# Patient Record
Sex: Female | Born: 1945 | Race: White | Hispanic: No | Marital: Married | Smoking: Never smoker
Health system: Southern US, Community
[De-identification: ages and names within clinical notes are randomized; demographics above are authoritative.]

## PROBLEM LIST (undated history)

## (undated) DIAGNOSIS — I5030 Unspecified diastolic (congestive) heart failure: Secondary | ICD-10-CM

## (undated) DIAGNOSIS — G4733 Obstructive sleep apnea (adult) (pediatric): Secondary | ICD-10-CM

## (undated) DIAGNOSIS — C50919 Malignant neoplasm of unspecified site of unspecified female breast: Secondary | ICD-10-CM

## (undated) DIAGNOSIS — S0101XA Laceration without foreign body of scalp, initial encounter: Secondary | ICD-10-CM

## (undated) DIAGNOSIS — N183 Chronic kidney disease, stage 3 unspecified: Secondary | ICD-10-CM

## (undated) DIAGNOSIS — D649 Anemia, unspecified: Secondary | ICD-10-CM

## (undated) DIAGNOSIS — N611 Abscess of the breast and nipple: Principal | ICD-10-CM

## (undated) DIAGNOSIS — E119 Type 2 diabetes mellitus without complications: Secondary | ICD-10-CM

## (undated) DIAGNOSIS — R002 Palpitations: Secondary | ICD-10-CM

## (undated) DIAGNOSIS — E785 Hyperlipidemia, unspecified: Secondary | ICD-10-CM

## (undated) DIAGNOSIS — S066X9A Traumatic subarachnoid hemorrhage with loss of consciousness of unspecified duration, initial encounter: Secondary | ICD-10-CM

## (undated) DIAGNOSIS — D126 Benign neoplasm of colon, unspecified: Secondary | ICD-10-CM

## (undated) DIAGNOSIS — G8929 Other chronic pain: Secondary | ICD-10-CM

## (undated) DIAGNOSIS — I350 Nonrheumatic aortic (valve) stenosis: Secondary | ICD-10-CM

## (undated) DIAGNOSIS — I10 Essential (primary) hypertension: Secondary | ICD-10-CM

## (undated) DIAGNOSIS — R109 Unspecified abdominal pain: Secondary | ICD-10-CM

## (undated) DIAGNOSIS — I050 Rheumatic mitral stenosis: Secondary | ICD-10-CM

## (undated) HISTORY — DX: Abscess of the breast and nipple: N61.1

## (undated) HISTORY — DX: Palpitations: R00.2

## (undated) HISTORY — DX: Malignant neoplasm of unspecified site of unspecified female breast: C50.919

## (undated) HISTORY — DX: Rheumatic mitral stenosis: I05.0

## (undated) HISTORY — DX: Obstructive sleep apnea (adult) (pediatric): G47.33

## (undated) HISTORY — PX: ULTRASOUND GUIDANCE FOR VASCULAR ACCESS: SHX6516

## (undated) HISTORY — PX: OTHER SURGICAL HISTORY: SHX169

## (undated) HISTORY — DX: Benign neoplasm of colon, unspecified: D12.6

## (undated) HISTORY — PX: DOPPLER ECHOCARDIOGRAPHY: SHX263

## (undated) HISTORY — DX: Nonrheumatic aortic (valve) stenosis: I35.0

---

## 2003-03-04 ENCOUNTER — Ambulatory Visit (HOSPITAL_COMMUNITY): Admission: RE | Admit: 2003-03-04 | Discharge: 2003-03-04 | Payer: Self-pay | Admitting: Internal Medicine

## 2003-03-04 HISTORY — PX: COLONOSCOPY: SHX174

## 2003-11-07 ENCOUNTER — Ambulatory Visit (HOSPITAL_COMMUNITY): Admission: RE | Admit: 2003-11-07 | Discharge: 2003-11-07 | Payer: Self-pay | Admitting: Obstetrics and Gynecology

## 2003-11-16 ENCOUNTER — Ambulatory Visit (HOSPITAL_COMMUNITY): Admission: RE | Admit: 2003-11-16 | Discharge: 2003-11-16 | Payer: Self-pay | Admitting: Obstetrics and Gynecology

## 2003-11-18 ENCOUNTER — Encounter (INDEPENDENT_AMBULATORY_CARE_PROVIDER_SITE_OTHER): Payer: Self-pay | Admitting: Specialist

## 2003-11-18 ENCOUNTER — Encounter: Admission: RE | Admit: 2003-11-18 | Discharge: 2003-11-18 | Payer: Self-pay | Admitting: Obstetrics and Gynecology

## 2003-11-29 DIAGNOSIS — C50919 Malignant neoplasm of unspecified site of unspecified female breast: Secondary | ICD-10-CM

## 2003-11-29 HISTORY — DX: Malignant neoplasm of unspecified site of unspecified female breast: C50.919

## 2003-12-08 ENCOUNTER — Ambulatory Visit (HOSPITAL_COMMUNITY): Admission: RE | Admit: 2003-12-08 | Discharge: 2003-12-08 | Payer: Self-pay | Admitting: Family Medicine

## 2003-12-16 ENCOUNTER — Ambulatory Visit (HOSPITAL_COMMUNITY): Admission: RE | Admit: 2003-12-16 | Discharge: 2003-12-16 | Payer: Self-pay | Admitting: General Surgery

## 2003-12-16 ENCOUNTER — Encounter: Admission: RE | Admit: 2003-12-16 | Discharge: 2003-12-16 | Payer: Self-pay | Admitting: General Surgery

## 2003-12-16 ENCOUNTER — Encounter (INDEPENDENT_AMBULATORY_CARE_PROVIDER_SITE_OTHER): Payer: Self-pay | Admitting: *Deleted

## 2003-12-16 ENCOUNTER — Ambulatory Visit (HOSPITAL_BASED_OUTPATIENT_CLINIC_OR_DEPARTMENT_OTHER): Admission: RE | Admit: 2003-12-16 | Discharge: 2003-12-16 | Payer: Self-pay | Admitting: General Surgery

## 2003-12-16 HISTORY — PX: BREAST SURGERY: SHX581

## 2004-01-09 ENCOUNTER — Ambulatory Visit: Admission: RE | Admit: 2004-01-09 | Discharge: 2004-04-08 | Payer: Self-pay | Admitting: Radiation Oncology

## 2004-04-17 ENCOUNTER — Ambulatory Visit (HOSPITAL_COMMUNITY): Admission: RE | Admit: 2004-04-17 | Discharge: 2004-04-17 | Payer: Self-pay | Admitting: Family Medicine

## 2004-07-25 ENCOUNTER — Ambulatory Visit (HOSPITAL_COMMUNITY): Admission: RE | Admit: 2004-07-25 | Discharge: 2004-07-25 | Payer: Self-pay | Admitting: General Surgery

## 2004-11-07 ENCOUNTER — Ambulatory Visit (HOSPITAL_COMMUNITY): Admission: RE | Admit: 2004-11-07 | Discharge: 2004-11-07 | Payer: Self-pay | Admitting: Hematology and Oncology

## 2005-11-13 ENCOUNTER — Ambulatory Visit (HOSPITAL_COMMUNITY): Admission: RE | Admit: 2005-11-13 | Discharge: 2005-11-13 | Payer: Self-pay | Admitting: Hematology and Oncology

## 2006-06-05 ENCOUNTER — Ambulatory Visit (HOSPITAL_COMMUNITY): Admission: RE | Admit: 2006-06-05 | Discharge: 2006-06-05 | Payer: Self-pay | Admitting: Hematology and Oncology

## 2006-11-26 ENCOUNTER — Ambulatory Visit (HOSPITAL_COMMUNITY): Admission: RE | Admit: 2006-11-26 | Discharge: 2006-11-26 | Payer: Self-pay | Admitting: Hematology and Oncology

## 2007-11-16 ENCOUNTER — Emergency Department (HOSPITAL_COMMUNITY): Admission: EM | Admit: 2007-11-16 | Discharge: 2007-11-16 | Payer: Self-pay | Admitting: Emergency Medicine

## 2007-12-14 ENCOUNTER — Ambulatory Visit (HOSPITAL_COMMUNITY): Admission: RE | Admit: 2007-12-14 | Discharge: 2007-12-14 | Payer: Self-pay | Admitting: Hematology and Oncology

## 2008-03-09 ENCOUNTER — Other Ambulatory Visit: Admission: RE | Admit: 2008-03-09 | Discharge: 2008-03-09 | Payer: Self-pay | Admitting: Obstetrics and Gynecology

## 2008-07-19 ENCOUNTER — Ambulatory Visit (HOSPITAL_COMMUNITY): Admission: RE | Admit: 2008-07-19 | Discharge: 2008-07-19 | Payer: Self-pay | Admitting: Hematology and Oncology

## 2008-10-24 ENCOUNTER — Emergency Department (HOSPITAL_COMMUNITY): Admission: EM | Admit: 2008-10-24 | Discharge: 2008-10-24 | Payer: Self-pay | Admitting: Emergency Medicine

## 2008-12-22 ENCOUNTER — Ambulatory Visit (HOSPITAL_COMMUNITY): Admission: RE | Admit: 2008-12-22 | Discharge: 2008-12-22 | Payer: Self-pay

## 2009-03-27 ENCOUNTER — Encounter: Payer: Self-pay | Admitting: Internal Medicine

## 2009-04-12 ENCOUNTER — Ambulatory Visit (HOSPITAL_COMMUNITY): Admission: RE | Admit: 2009-04-12 | Discharge: 2009-04-12 | Payer: Self-pay | Admitting: Internal Medicine

## 2009-04-12 ENCOUNTER — Encounter: Payer: Self-pay | Admitting: Internal Medicine

## 2009-04-12 ENCOUNTER — Ambulatory Visit: Payer: Self-pay | Admitting: Internal Medicine

## 2009-04-12 HISTORY — PX: COLONOSCOPY: SHX174

## 2009-04-14 ENCOUNTER — Encounter: Payer: Self-pay | Admitting: Internal Medicine

## 2009-12-06 ENCOUNTER — Other Ambulatory Visit: Admission: RE | Admit: 2009-12-06 | Discharge: 2009-12-06 | Payer: Self-pay | Admitting: Obstetrics and Gynecology

## 2010-01-24 ENCOUNTER — Ambulatory Visit (HOSPITAL_COMMUNITY): Admission: RE | Admit: 2010-01-24 | Discharge: 2010-01-24 | Payer: Self-pay | Admitting: Hematology and Oncology

## 2010-04-25 ENCOUNTER — Emergency Department (HOSPITAL_COMMUNITY): Admission: EM | Admit: 2010-04-25 | Discharge: 2010-04-25 | Payer: Self-pay | Admitting: Emergency Medicine

## 2010-05-31 HISTORY — PX: CARDIAC CATHETERIZATION: SHX172

## 2010-06-29 ENCOUNTER — Ambulatory Visit (HOSPITAL_COMMUNITY): Admission: RE | Admit: 2010-06-29 | Discharge: 2010-06-30 | Payer: Self-pay | Admitting: Cardiovascular Disease

## 2010-07-09 ENCOUNTER — Encounter (INDEPENDENT_AMBULATORY_CARE_PROVIDER_SITE_OTHER): Payer: Self-pay | Admitting: Cardiovascular Disease

## 2010-07-09 ENCOUNTER — Ambulatory Visit (HOSPITAL_COMMUNITY): Admission: RE | Admit: 2010-07-09 | Discharge: 2010-07-09 | Payer: Self-pay | Admitting: Cardiovascular Disease

## 2010-07-24 ENCOUNTER — Ambulatory Visit: Payer: Self-pay | Admitting: Surgery

## 2010-08-01 ENCOUNTER — Ambulatory Visit: Payer: Self-pay | Admitting: Surgery

## 2010-08-01 ENCOUNTER — Inpatient Hospital Stay (HOSPITAL_COMMUNITY)
Admission: RE | Admit: 2010-08-01 | Discharge: 2010-08-08 | Payer: Self-pay | Source: Home / Self Care | Admitting: Surgery

## 2010-08-01 ENCOUNTER — Encounter: Payer: Self-pay | Admitting: Surgery

## 2010-08-01 HISTORY — PX: AORTIC VALVE REPLACEMENT: SHX41

## 2010-08-21 ENCOUNTER — Ambulatory Visit: Payer: Self-pay | Admitting: Surgery

## 2010-08-21 ENCOUNTER — Encounter: Admission: RE | Admit: 2010-08-21 | Discharge: 2010-08-21 | Payer: Self-pay | Admitting: Surgery

## 2010-10-03 ENCOUNTER — Encounter (HOSPITAL_COMMUNITY)
Admission: RE | Admit: 2010-10-03 | Discharge: 2010-10-30 | Payer: Self-pay | Source: Home / Self Care | Attending: Cardiovascular Disease | Admitting: Cardiovascular Disease

## 2010-10-19 ENCOUNTER — Other Ambulatory Visit (HOSPITAL_COMMUNITY): Payer: Self-pay | Admitting: Hematology and Oncology

## 2010-10-19 DIAGNOSIS — Z9889 Other specified postprocedural states: Secondary | ICD-10-CM

## 2010-10-20 ENCOUNTER — Encounter: Payer: Self-pay | Admitting: Hematology and Oncology

## 2010-10-21 ENCOUNTER — Encounter: Payer: Self-pay | Admitting: Hematology and Oncology

## 2010-10-22 ENCOUNTER — Encounter: Payer: Self-pay | Admitting: Hematology and Oncology

## 2010-10-31 ENCOUNTER — Ambulatory Visit (HOSPITAL_COMMUNITY): Payer: MEDICARE | Attending: Cardiovascular Disease

## 2010-10-31 DIAGNOSIS — E785 Hyperlipidemia, unspecified: Secondary | ICD-10-CM | POA: Insufficient documentation

## 2010-10-31 DIAGNOSIS — I10 Essential (primary) hypertension: Secondary | ICD-10-CM | POA: Insufficient documentation

## 2010-10-31 DIAGNOSIS — Z5189 Encounter for other specified aftercare: Secondary | ICD-10-CM | POA: Insufficient documentation

## 2010-10-31 DIAGNOSIS — I359 Nonrheumatic aortic valve disorder, unspecified: Secondary | ICD-10-CM | POA: Insufficient documentation

## 2010-10-31 DIAGNOSIS — E119 Type 2 diabetes mellitus without complications: Secondary | ICD-10-CM | POA: Insufficient documentation

## 2010-11-02 ENCOUNTER — Ambulatory Visit (HOSPITAL_COMMUNITY): Payer: MEDICARE | Attending: Cardiovascular Disease

## 2010-11-02 DIAGNOSIS — Z5189 Encounter for other specified aftercare: Secondary | ICD-10-CM | POA: Insufficient documentation

## 2010-11-02 DIAGNOSIS — I359 Nonrheumatic aortic valve disorder, unspecified: Secondary | ICD-10-CM | POA: Insufficient documentation

## 2010-11-05 ENCOUNTER — Ambulatory Visit (HOSPITAL_COMMUNITY): Payer: MEDICARE | Attending: Cardiovascular Disease

## 2010-11-05 DIAGNOSIS — Z5189 Encounter for other specified aftercare: Secondary | ICD-10-CM | POA: Insufficient documentation

## 2010-11-05 DIAGNOSIS — I359 Nonrheumatic aortic valve disorder, unspecified: Secondary | ICD-10-CM | POA: Insufficient documentation

## 2010-11-07 ENCOUNTER — Ambulatory Visit (HOSPITAL_COMMUNITY): Payer: MEDICARE | Attending: Cardiovascular Disease

## 2010-11-07 DIAGNOSIS — I359 Nonrheumatic aortic valve disorder, unspecified: Secondary | ICD-10-CM | POA: Insufficient documentation

## 2010-11-07 DIAGNOSIS — Z5189 Encounter for other specified aftercare: Secondary | ICD-10-CM | POA: Insufficient documentation

## 2010-11-09 ENCOUNTER — Ambulatory Visit (HOSPITAL_COMMUNITY): Payer: MEDICARE | Attending: Cardiovascular Disease

## 2010-11-09 DIAGNOSIS — Z5189 Encounter for other specified aftercare: Secondary | ICD-10-CM | POA: Insufficient documentation

## 2010-11-09 DIAGNOSIS — I359 Nonrheumatic aortic valve disorder, unspecified: Secondary | ICD-10-CM | POA: Insufficient documentation

## 2010-11-12 ENCOUNTER — Ambulatory Visit (HOSPITAL_COMMUNITY): Payer: MEDICARE | Attending: Cardiovascular Disease

## 2010-11-12 DIAGNOSIS — Z5189 Encounter for other specified aftercare: Secondary | ICD-10-CM | POA: Insufficient documentation

## 2010-11-12 DIAGNOSIS — I359 Nonrheumatic aortic valve disorder, unspecified: Secondary | ICD-10-CM | POA: Insufficient documentation

## 2010-11-14 ENCOUNTER — Ambulatory Visit (HOSPITAL_COMMUNITY): Payer: MEDICARE | Attending: Cardiovascular Disease

## 2010-11-14 DIAGNOSIS — I359 Nonrheumatic aortic valve disorder, unspecified: Secondary | ICD-10-CM | POA: Insufficient documentation

## 2010-11-14 DIAGNOSIS — Z5189 Encounter for other specified aftercare: Secondary | ICD-10-CM | POA: Insufficient documentation

## 2010-11-14 DIAGNOSIS — Z954 Presence of other heart-valve replacement: Secondary | ICD-10-CM | POA: Insufficient documentation

## 2010-11-16 ENCOUNTER — Ambulatory Visit (HOSPITAL_COMMUNITY): Payer: MEDICARE

## 2010-11-19 ENCOUNTER — Ambulatory Visit (HOSPITAL_COMMUNITY): Payer: MEDICARE

## 2010-11-21 ENCOUNTER — Ambulatory Visit (HOSPITAL_COMMUNITY): Payer: MEDICARE

## 2010-11-23 ENCOUNTER — Ambulatory Visit (HOSPITAL_COMMUNITY): Payer: MEDICARE

## 2010-11-26 ENCOUNTER — Ambulatory Visit (HOSPITAL_COMMUNITY): Payer: MEDICARE

## 2010-11-28 ENCOUNTER — Ambulatory Visit (HOSPITAL_COMMUNITY): Payer: MEDICARE

## 2010-11-30 ENCOUNTER — Ambulatory Visit (HOSPITAL_COMMUNITY): Payer: MEDICARE

## 2010-12-03 ENCOUNTER — Ambulatory Visit (HOSPITAL_COMMUNITY): Payer: MEDICARE | Attending: Cardiovascular Disease

## 2010-12-03 DIAGNOSIS — I359 Nonrheumatic aortic valve disorder, unspecified: Secondary | ICD-10-CM | POA: Insufficient documentation

## 2010-12-03 DIAGNOSIS — Z5189 Encounter for other specified aftercare: Secondary | ICD-10-CM | POA: Insufficient documentation

## 2010-12-03 DIAGNOSIS — Z954 Presence of other heart-valve replacement: Secondary | ICD-10-CM | POA: Insufficient documentation

## 2010-12-05 ENCOUNTER — Ambulatory Visit (HOSPITAL_COMMUNITY): Payer: MEDICARE

## 2010-12-07 ENCOUNTER — Ambulatory Visit (HOSPITAL_COMMUNITY): Payer: MEDICARE

## 2010-12-10 ENCOUNTER — Ambulatory Visit (HOSPITAL_COMMUNITY): Payer: MEDICARE

## 2010-12-11 LAB — MAGNESIUM
Magnesium: 2.3 mg/dL (ref 1.5–2.5)
Magnesium: 2.5 mg/dL (ref 1.5–2.5)

## 2010-12-11 LAB — PROTIME-INR
INR: 1.24 (ref 0.00–1.49)
INR: 1.3 (ref 0.00–1.49)
INR: 1.34 (ref 0.00–1.49)
Prothrombin Time: 15.3 seconds — ABNORMAL HIGH (ref 11.6–15.2)
Prothrombin Time: 16.4 seconds — ABNORMAL HIGH (ref 11.6–15.2)
Prothrombin Time: 16.8 seconds — ABNORMAL HIGH (ref 11.6–15.2)

## 2010-12-11 LAB — CBC
Hemoglobin: 10.3 g/dL — ABNORMAL LOW (ref 12.0–15.0)
Hemoglobin: 10.4 g/dL — ABNORMAL LOW (ref 12.0–15.0)
Hemoglobin: 10.5 g/dL — ABNORMAL LOW (ref 12.0–15.0)
Hemoglobin: 10.7 g/dL — ABNORMAL LOW (ref 12.0–15.0)
Hemoglobin: 10.8 g/dL — ABNORMAL LOW (ref 12.0–15.0)
MCH: 27.7 pg (ref 26.0–34.0)
MCH: 27.9 pg (ref 26.0–34.0)
MCH: 28 pg (ref 26.0–34.0)
MCH: 28 pg (ref 26.0–34.0)
MCHC: 31.9 g/dL (ref 30.0–36.0)
MCHC: 32.2 g/dL (ref 30.0–36.0)
MCHC: 32.3 g/dL (ref 30.0–36.0)
MCHC: 32.6 g/dL (ref 30.0–36.0)
MCHC: 32.8 g/dL (ref 30.0–36.0)
MCV: 85.8 fL (ref 78.0–100.0)
MCV: 86 fL (ref 78.0–100.0)
MCV: 86.9 fL (ref 78.0–100.0)
Platelets: 106 10*3/uL — ABNORMAL LOW (ref 150–400)
Platelets: 114 10*3/uL — ABNORMAL LOW (ref 150–400)
Platelets: 122 10*3/uL — ABNORMAL LOW (ref 150–400)
Platelets: 126 10*3/uL — ABNORMAL LOW (ref 150–400)
Platelets: 95 10*3/uL — ABNORMAL LOW (ref 150–400)
RBC: 3.65 MIL/uL — ABNORMAL LOW (ref 3.87–5.11)
RBC: 3.74 MIL/uL — ABNORMAL LOW (ref 3.87–5.11)
RBC: 3.82 MIL/uL — ABNORMAL LOW (ref 3.87–5.11)
RBC: 3.86 MIL/uL — ABNORMAL LOW (ref 3.87–5.11)
RDW: 14 % (ref 11.5–15.5)
RDW: 14.1 % (ref 11.5–15.5)
RDW: 14.2 % (ref 11.5–15.5)
RDW: 14.2 % (ref 11.5–15.5)
RDW: 14.3 % (ref 11.5–15.5)
WBC: 10.7 10*3/uL — ABNORMAL HIGH (ref 4.0–10.5)
WBC: 12.6 10*3/uL — ABNORMAL HIGH (ref 4.0–10.5)

## 2010-12-11 LAB — BASIC METABOLIC PANEL
BUN: 21 mg/dL (ref 6–23)
BUN: 26 mg/dL — ABNORMAL HIGH (ref 6–23)
BUN: 27 mg/dL — ABNORMAL HIGH (ref 6–23)
CO2: 25 mEq/L (ref 19–32)
CO2: 25 mEq/L (ref 19–32)
CO2: 28 mEq/L (ref 19–32)
Calcium: 8 mg/dL — ABNORMAL LOW (ref 8.4–10.5)
Calcium: 8.9 mg/dL (ref 8.4–10.5)
Calcium: 9 mg/dL (ref 8.4–10.5)
Chloride: 108 mEq/L (ref 96–112)
Creatinine, Ser: 0.63 mg/dL (ref 0.4–1.2)
Creatinine, Ser: 1.02 mg/dL (ref 0.4–1.2)
Creatinine, Ser: 1.13 mg/dL (ref 0.4–1.2)
Creatinine, Ser: 1.22 mg/dL — ABNORMAL HIGH (ref 0.4–1.2)
GFR calc Af Amer: 54 mL/min — ABNORMAL LOW (ref >60)
GFR calc Af Amer: 58 mL/min — ABNORMAL LOW (ref >60)
GFR calc non Af Amer: 48 mL/min — ABNORMAL LOW (ref >60)
GFR calc non Af Amer: 58 mL/min — ABNORMAL LOW (ref >60)
Glucose, Bld: 121 mg/dL — ABNORMAL HIGH (ref 70–99)
Glucose, Bld: 122 mg/dL — ABNORMAL HIGH (ref 70–99)
Glucose, Bld: 169 mg/dL — ABNORMAL HIGH (ref 70–99)
Potassium: 4 mEq/L (ref 3.5–5.1)
Potassium: 4.1 mEq/L (ref 3.5–5.1)
Sodium: 139 mEq/L (ref 135–145)
Sodium: 142 mEq/L (ref 135–145)

## 2010-12-11 LAB — POCT I-STAT, CHEM 8
BUN: 16 mg/dL (ref 6–23)
Calcium, Ion: 1.17 mmol/L (ref 1.12–1.32)
Calcium, Ion: 1.18 mmol/L (ref 1.12–1.32)
Creatinine, Ser: 0.6 mg/dL (ref 0.4–1.2)
Creatinine, Ser: 1 mg/dL (ref 0.4–1.2)
Glucose, Bld: 169 mg/dL — ABNORMAL HIGH (ref 70–99)
Hemoglobin: 10.9 g/dL — ABNORMAL LOW (ref 12.0–15.0)
Hemoglobin: 11.6 g/dL — ABNORMAL LOW (ref 12.0–15.0)
Sodium: 139 mEq/L (ref 135–145)
TCO2: 24 mmol/L (ref 0–100)
TCO2: 25 mmol/L (ref 0–100)

## 2010-12-11 LAB — POCT I-STAT 4, (NA,K, GLUC, HGB,HCT)
Glucose, Bld: 151 mg/dL — ABNORMAL HIGH (ref 70–99)
Glucose, Bld: 170 mg/dL — ABNORMAL HIGH (ref 70–99)
HCT: 27 % — ABNORMAL LOW (ref 36.0–46.0)
HCT: 28 % — ABNORMAL LOW (ref 36.0–46.0)
HCT: 39 % (ref 36.0–46.0)
Hemoglobin: 13.3 g/dL (ref 12.0–15.0)
Hemoglobin: 9.2 g/dL — ABNORMAL LOW (ref 12.0–15.0)
Hemoglobin: 9.5 g/dL — ABNORMAL LOW (ref 12.0–15.0)
Potassium: 4.2 mEq/L (ref 3.5–5.1)
Potassium: 4.5 mEq/L (ref 3.5–5.1)
Potassium: 4.6 mEq/L (ref 3.5–5.1)
Potassium: 4.8 mEq/L (ref 3.5–5.1)
Sodium: 137 mEq/L (ref 135–145)
Sodium: 140 mEq/L (ref 135–145)
Sodium: 140 mEq/L (ref 135–145)
Sodium: 141 mEq/L (ref 135–145)

## 2010-12-11 LAB — HEMOGLOBIN AND HEMATOCRIT, BLOOD
HCT: 27.2 % — ABNORMAL LOW (ref 36.0–46.0)
Hemoglobin: 9.2 g/dL — ABNORMAL LOW (ref 12.0–15.0)

## 2010-12-11 LAB — GLUCOSE, CAPILLARY
Glucose-Capillary: 100 mg/dL — ABNORMAL HIGH (ref 70–99)
Glucose-Capillary: 101 mg/dL — ABNORMAL HIGH (ref 70–99)
Glucose-Capillary: 102 mg/dL — ABNORMAL HIGH (ref 70–99)
Glucose-Capillary: 128 mg/dL — ABNORMAL HIGH (ref 70–99)
Glucose-Capillary: 129 mg/dL — ABNORMAL HIGH (ref 70–99)
Glucose-Capillary: 138 mg/dL — ABNORMAL HIGH (ref 70–99)
Glucose-Capillary: 145 mg/dL — ABNORMAL HIGH (ref 70–99)
Glucose-Capillary: 173 mg/dL — ABNORMAL HIGH (ref 70–99)
Glucose-Capillary: 183 mg/dL — ABNORMAL HIGH (ref 70–99)
Glucose-Capillary: 184 mg/dL — ABNORMAL HIGH (ref 70–99)
Glucose-Capillary: 193 mg/dL — ABNORMAL HIGH (ref 70–99)
Glucose-Capillary: 249 mg/dL — ABNORMAL HIGH (ref 70–99)
Glucose-Capillary: 87 mg/dL (ref 70–99)
Glucose-Capillary: 88 mg/dL (ref 70–99)
Glucose-Capillary: >600 mg/dL (ref 70–99)

## 2010-12-11 LAB — POCT I-STAT 3, ART BLOOD GAS (G3+)
Acid-base deficit: 1 mmol/L (ref 0.0–2.0)
Acid-base deficit: 1 mmol/L (ref 0.0–2.0)
Bicarbonate: 23.6 mEq/L (ref 20.0–24.0)
Bicarbonate: 27.5 mEq/L — ABNORMAL HIGH (ref 20.0–24.0)
O2 Saturation: 100 %
O2 Saturation: 89 %
Patient temperature: 37
TCO2: 25 mmol/L (ref 0–100)
TCO2: 27 mmol/L (ref 0–100)
TCO2: 29 mmol/L (ref 0–100)
pCO2 arterial: 43.3 mmHg (ref 35.0–45.0)
pCO2 arterial: 46.9 mmHg — ABNORMAL HIGH (ref 35.0–45.0)
pCO2 arterial: 62.7 mmHg (ref 35.0–45.0)
pH, Arterial: 7.25 — ABNORMAL LOW (ref 7.350–7.400)
pH, Arterial: 7.299 — ABNORMAL LOW (ref 7.350–7.400)
pH, Arterial: 7.329 — ABNORMAL LOW (ref 7.350–7.400)
pO2, Arterial: 58 mmHg — ABNORMAL LOW (ref 80.0–100.0)

## 2010-12-11 LAB — CREATININE, SERUM
Creatinine, Ser: 0.6 mg/dL (ref 0.4–1.2)
GFR calc Af Amer: >60 mL/min (ref >60)
GFR calc Af Amer: >60 mL/min (ref >60)

## 2010-12-11 LAB — PLATELET COUNT: Platelets: 110 10*3/uL — ABNORMAL LOW (ref 150–400)

## 2010-12-11 LAB — APTT: aPTT: 30 seconds (ref 24–37)

## 2010-12-11 LAB — POCT I-STAT 3, VENOUS BLOOD GAS (G3P V)
Acid-base deficit: 1 mmol/L (ref 0.0–2.0)
O2 Saturation: 78 %
pCO2, Ven: 49.2 mmHg (ref 45.0–50.0)

## 2010-12-12 ENCOUNTER — Ambulatory Visit (HOSPITAL_COMMUNITY): Payer: MEDICARE

## 2010-12-12 LAB — URINALYSIS, ROUTINE W REFLEX MICROSCOPIC
Glucose, UA: NEGATIVE mg/dL
Nitrite: NEGATIVE
Protein, ur: NEGATIVE mg/dL
Urobilinogen, UA: 1 mg/dL (ref 0.0–1.0)

## 2010-12-12 LAB — COMPREHENSIVE METABOLIC PANEL
ALT: 19 U/L (ref 0–35)
AST: 21 U/L (ref 0–37)
Alkaline Phosphatase: 57 U/L (ref 39–117)
CO2: 26 mEq/L (ref 19–32)
Glucose, Bld: 104 mg/dL — ABNORMAL HIGH (ref 70–99)
Potassium: 4.5 mEq/L (ref 3.5–5.1)
Sodium: 142 mEq/L (ref 135–145)
Total Protein: 6.4 g/dL (ref 6.0–8.3)

## 2010-12-12 LAB — HEMOGLOBIN A1C: Hgb A1c MFr Bld: 6.8 % — ABNORMAL HIGH (ref <5.7)

## 2010-12-12 LAB — CBC
HCT: 40.4 % (ref 36.0–46.0)
Hemoglobin: 12.6 g/dL (ref 12.0–15.0)
Hemoglobin: 12.9 g/dL (ref 12.0–15.0)
MCH: 28.3 pg (ref 26.0–34.0)
MCV: 85.4 fL (ref 78.0–100.0)
RBC: 4.46 MIL/uL (ref 3.87–5.11)
RDW: 14.1 % (ref 11.5–15.5)
WBC: 7.3 10*3/uL (ref 4.0–10.5)
WBC: 8.3 10*3/uL (ref 4.0–10.5)

## 2010-12-12 LAB — URINE MICROSCOPIC-ADD ON

## 2010-12-12 LAB — BLOOD GAS, ARTERIAL
Bicarbonate: 26.1 mEq/L — ABNORMAL HIGH (ref 20.0–24.0)
FIO2: 0.21 %
pH, Arterial: 7.393 (ref 7.350–7.400)
pO2, Arterial: 60 mmHg — ABNORMAL LOW (ref 80.0–100.0)

## 2010-12-12 LAB — APTT: aPTT: 23 seconds — ABNORMAL LOW (ref 24–37)

## 2010-12-12 LAB — GLUCOSE, CAPILLARY
Glucose-Capillary: 148 mg/dL — ABNORMAL HIGH (ref 70–99)
Glucose-Capillary: 262 mg/dL — ABNORMAL HIGH (ref 70–99)
Glucose-Capillary: 310 mg/dL — ABNORMAL HIGH (ref 70–99)

## 2010-12-12 LAB — SURGICAL PCR SCREEN
MRSA, PCR: NEGATIVE
Staphylococcus aureus: NEGATIVE

## 2010-12-12 LAB — BASIC METABOLIC PANEL
CO2: 25 mEq/L (ref 19–32)
Chloride: 105 mEq/L (ref 96–112)
GFR calc Af Amer: >60 mL/min (ref >60)
Potassium: 4.1 mEq/L (ref 3.5–5.1)
Sodium: 138 mEq/L (ref 135–145)

## 2010-12-12 LAB — ABO/RH: ABO/RH(D): B POS

## 2010-12-13 LAB — POCT I-STAT 3, ART BLOOD GAS (G3+)
O2 Saturation: 88 %
TCO2: 27 mmol/L (ref 0–100)
pCO2 arterial: 46 mmHg — ABNORMAL HIGH (ref 35.0–45.0)
pH, Arterial: 7.349 — ABNORMAL LOW (ref 7.350–7.400)

## 2010-12-13 LAB — GLUCOSE, CAPILLARY: Glucose-Capillary: 141 mg/dL — ABNORMAL HIGH (ref 70–99)

## 2010-12-13 LAB — POCT I-STAT 3, VENOUS BLOOD GAS (G3P V)
Acid-Base Excess: 1 mmol/L (ref 0.0–2.0)
O2 Saturation: 56 %
pCO2, Ven: 52.9 mmHg — ABNORMAL HIGH (ref 45.0–50.0)
pO2, Ven: 32 mmHg (ref 30.0–45.0)

## 2010-12-14 ENCOUNTER — Ambulatory Visit (HOSPITAL_COMMUNITY): Payer: MEDICARE

## 2010-12-15 LAB — CBC
HCT: 37.5 % (ref 36.0–46.0)
MCHC: 34.6 g/dL (ref 30.0–36.0)
MCV: 84.9 fL (ref 78.0–100.0)
Platelets: 166 10*3/uL (ref 150–400)
RDW: 14 % (ref 11.5–15.5)
WBC: 8 10*3/uL (ref 4.0–10.5)

## 2010-12-15 LAB — BASIC METABOLIC PANEL
BUN: 12 mg/dL (ref 6–23)
Chloride: 106 mEq/L (ref 96–112)
Creatinine, Ser: 0.6 mg/dL (ref 0.4–1.2)
Glucose, Bld: 187 mg/dL — ABNORMAL HIGH (ref 70–99)
Potassium: 4.1 mEq/L (ref 3.5–5.1)

## 2010-12-15 LAB — DIFFERENTIAL
Basophils Absolute: 0.1 10*3/uL (ref 0.0–0.1)
Basophils Relative: 1 % (ref 0–1)
Eosinophils Absolute: 0.1 10*3/uL (ref 0.0–0.7)
Eosinophils Relative: 2 % (ref 0–5)
Lymphocytes Relative: 21 % (ref 12–46)
Monocytes Absolute: 0.6 10*3/uL (ref 0.1–1.0)

## 2010-12-15 LAB — POCT CARDIAC MARKERS

## 2010-12-17 ENCOUNTER — Ambulatory Visit (HOSPITAL_COMMUNITY): Payer: MEDICARE

## 2010-12-19 ENCOUNTER — Ambulatory Visit (HOSPITAL_COMMUNITY): Payer: MEDICARE

## 2010-12-21 ENCOUNTER — Ambulatory Visit (HOSPITAL_COMMUNITY): Payer: MEDICARE

## 2010-12-24 ENCOUNTER — Ambulatory Visit (HOSPITAL_COMMUNITY): Payer: MEDICARE

## 2010-12-26 ENCOUNTER — Ambulatory Visit (HOSPITAL_COMMUNITY): Payer: MEDICARE

## 2010-12-28 ENCOUNTER — Ambulatory Visit (HOSPITAL_COMMUNITY): Payer: MEDICARE

## 2010-12-31 ENCOUNTER — Ambulatory Visit (HOSPITAL_COMMUNITY): Payer: Medicare Other | Attending: Cardiovascular Disease

## 2010-12-31 DIAGNOSIS — I359 Nonrheumatic aortic valve disorder, unspecified: Secondary | ICD-10-CM | POA: Insufficient documentation

## 2010-12-31 DIAGNOSIS — Z5189 Encounter for other specified aftercare: Secondary | ICD-10-CM | POA: Insufficient documentation

## 2010-12-31 DIAGNOSIS — Z954 Presence of other heart-valve replacement: Secondary | ICD-10-CM | POA: Insufficient documentation

## 2011-01-02 ENCOUNTER — Ambulatory Visit (HOSPITAL_COMMUNITY): Payer: Medicare Other

## 2011-01-04 ENCOUNTER — Ambulatory Visit (HOSPITAL_COMMUNITY): Payer: Medicare Other

## 2011-01-07 ENCOUNTER — Ambulatory Visit (HOSPITAL_COMMUNITY): Payer: Medicare Other

## 2011-01-09 ENCOUNTER — Ambulatory Visit (HOSPITAL_COMMUNITY): Payer: MEDICARE

## 2011-01-11 ENCOUNTER — Ambulatory Visit (HOSPITAL_COMMUNITY): Payer: MEDICARE

## 2011-01-14 ENCOUNTER — Ambulatory Visit (HOSPITAL_COMMUNITY): Payer: MEDICARE

## 2011-01-14 LAB — GLUCOSE, CAPILLARY: Glucose-Capillary: 162 mg/dL — ABNORMAL HIGH (ref 70–99)

## 2011-01-16 ENCOUNTER — Ambulatory Visit (HOSPITAL_COMMUNITY): Payer: MEDICARE

## 2011-01-18 ENCOUNTER — Ambulatory Visit (HOSPITAL_COMMUNITY): Payer: Medicare Other

## 2011-01-21 ENCOUNTER — Ambulatory Visit (HOSPITAL_COMMUNITY): Payer: Medicare Other

## 2011-01-23 ENCOUNTER — Ambulatory Visit (HOSPITAL_COMMUNITY): Payer: MEDICARE

## 2011-01-24 ENCOUNTER — Other Ambulatory Visit (HOSPITAL_COMMUNITY): Payer: Self-pay | Admitting: Hematology and Oncology

## 2011-01-24 DIAGNOSIS — Z9889 Other specified postprocedural states: Secondary | ICD-10-CM

## 2011-01-25 ENCOUNTER — Ambulatory Visit (HOSPITAL_COMMUNITY): Payer: MEDICARE

## 2011-01-30 ENCOUNTER — Ambulatory Visit (HOSPITAL_COMMUNITY): Admission: RE | Admit: 2011-01-30 | Payer: MEDICARE | Source: Ambulatory Visit

## 2011-01-30 ENCOUNTER — Encounter (HOSPITAL_COMMUNITY): Payer: MEDICARE

## 2011-02-06 ENCOUNTER — Ambulatory Visit (HOSPITAL_COMMUNITY)
Admission: RE | Admit: 2011-02-06 | Discharge: 2011-02-06 | Disposition: A | Discharge status: Home / Self Care | Payer: Medicare Other | Source: Ambulatory Visit | Attending: Hematology and Oncology | Admitting: Hematology and Oncology

## 2011-02-06 DIAGNOSIS — Z853 Personal history of malignant neoplasm of breast: Secondary | ICD-10-CM | POA: Insufficient documentation

## 2011-02-06 DIAGNOSIS — Z9889 Other specified postprocedural states: Secondary | ICD-10-CM

## 2011-02-12 NOTE — Op Note (Signed)
NAME:  Hailey Curry, Hailey Curry              ACCOUNT NO.:  [id number]   MEDICAL RECORD NO.:  [id number]          PATIENT TYPE:  AMB   LOCATION:  DAY                           FACILITY:  [facility]   PHYSICIAN:  R. Roetta Sessions, M.D. DATE OF BIRTH:  [birth year]   DATE OF PROCEDURE:  04/12/2009  DATE OF DISCHARGE:                               OPERATIVE REPORT   INDICATIONS FOR PROCEDURE:  A 63-year lady with history colonic  adenomas. Last colonoscopy yielded only a single inflammatory polyp.  She is really having no lower GI tract symptoms.  She is here for  surveillance.  Risks, benefits, alternatives and limitations have  discussed, questions answered.  Please see __________ the medical  record.   PROCEDURE NOTE:  The O2 saturation, blood pressure, pulse respirations  monitored throughout entire procedure.  Conscious sedation Versed 7 mg  IV,  fentanyl 125 mcg IV in divided doses.   INSTRUMENT:  Pentax video chip system.   FINDINGS:  Digital rectal exam revealed no abnormalities.  The prep was  adequate.  Colon:  Colonic mucosa was surveyed from the rectosigmoid  junction through the left transverse, right colon, appendiceal orifice,  ileocecal valve and cecum.  These structures well seen, photographed for  the record.  From this level, the scope was slowly cautiously withdrawn.  All previous mentioned mucosal surfaces were again seen.  The patient  was noted to have left-sided diverticula in the distal ascending  segment.  There was a 2 cm x 0.5-cm polyp.  I saw polyps on a fold which  was resected clean with two passes of the hot snare cautery.  There was  a 4-mm polyp on a stalk adjacent to this area which was cold snared.  The remainder of the colonic mucosa appeared normal.  Scope was pulled  down to the rectum.  __________ the rectal mucosa including retroflexed  view of the anal verge demonstrated a 4-mm polyp on a stalk at 7 cm from  the anal verge.  It was hot snared, removed and  recovered.  The patient  tolerated the procedure well __________ endoscopy.  Cecal withdrawal  time 12 minutes.   IMPRESSION:  1. Rectal polyp, status post snare removal, otherwise unremarkable      rectum.  2. Left-sided diverticula.  3. Polyps in the ascending colon, status post snare polypectomy.  4. The remainder of the colonic os appeared unremarkable.   RECOMMENDATIONS:  1. No aspirin or __________medications for 5 days.  2. Follow-up on path.  3. Further recommendations to follow.      Jonathon Bellows, M.D.  Electronically Signed     RMR/MEDQ  D:  04/12/2009  T:  04/12/2009  Job:  [id number]   cc:   Kirk Ruths, M.D.  Fax: [phone]

## 2011-02-12 NOTE — Consult Note (Signed)
NEW PATIENT CONSULTATION   Hailey, Curry  DOB:  13-Jun-1946                                        July 24, 2010  CHART #:  [id number]   ADDENDUM.   PAST MEDICAL HISTORY:  She also has a history of breast cancer status  post right breast lumpectomy and postoperative radiation therapy and  oral chemotherapy and has been followed by Dr. Margo Common without evidence  of recurrence.   Evelene Croon, M.D.  Electronically Signed   BB/MEDQ  D:  07/24/2010  T:  07/25/2010  Job:  [id number]

## 2011-02-12 NOTE — Assessment & Plan Note (Signed)
OFFICE VISIT   Hailey Curry, Hailey Curry  DOB:  Nov 27, 1945                                        August 21, 2010  CHART #:  [id number]   HISTORY:  The patient returned to my office today for followup status  post aortic valve replacement with a 21-mm [employer] mechanical  valve on August 01, 2010.  Her postoperative course was complicated by  atrial fibrillation and she was started on amiodarone with conversion to  sinus rhythm.  She was sent home on Coumadin for mechanical valve and  the INR has been adjusted by Dr. Allyson Sabal.  She said she has been feeling  well overall.  She is walking mostly in-house without chest pain or  shortness of breath.  She did get out to a couple of stores yesterday  and said she was fairly tired at the end of the day.  She feels much  better today though.   PHYSICAL EXAMINATION:  Blood pressure 133/75, her pulse is 78 and  regular, respiratory rate is 18 and unlabored.  Oxygen saturation on  room air is 94%.  She looks well.  Cardiac exam shows regular rate and  rhythm with crisp valve sounds.  Her lungs are clear.  Her chest  incision is healing well and sternum is stable.  There is no peripheral  edema.   MEDICATIONS:  Aspirin 81 mg daily, fish oil 1200 mg b.i.d., Janumet  50/1000 b.i.d., cyanocobalamin monthly, amiodarone 200 mg daily, Lasix  40 mg b.i.d., potassium chloride 20 mEq b.i.d., Lopressor 25 mg daily,  Coumadin as directed, Celexa 20 mg q.h.s., and Flonase spray p.r.n.   IMPRESSION:  Overall, the patient is doing very well following her  surgery.  She was sent home on Lasix 40 b.i.d. and potassium 20 b.i.d.  and she said her weight has been stable.  She has no pleural effusion or  peripheral edema, and I told her she could decrease Lasix and potassium  to once daily for the next 2 weeks.  If her weight remains stable on  that dose, then she can discontinue those medications altogether.  She  had been on  spirolactone preoperatively.  I told her she can return to  driving car at this time, but should refrain from lifting anything  heavier than 10 pounds for total of 3 months from date of surgery.  She  will continue to follow up with Dr.  Allyson Sabal and he will follow her anticoagulation profile.  She will return  to see me if she has any problems with her incision.   Evelene Croon, M.D.  Electronically Signed   BB/MEDQ  D:  08/21/2010  T:  08/21/2010  Job:  [id number]   cc:   Kirk Ruths, M.D.

## 2011-02-12 NOTE — Consult Note (Signed)
NEW PATIENT CONSULTATION   Hailey Curry, Hailey Curry  DOB:  Jul 01, 1946                                        July 24, 2010  CHART #:  [id number]   REASON FOR CONSULTATION:  Critical aortic stenosis.   CLINICAL HISTORY:  I was asked by Dr. Allyson Sabal to evaluate the patient for  consideration of aortic valve replacement.  She is a 65 year old woman  with a known history of moderate aortic stenosis by echocardiogram in  October 2010.  She was seen in the emergency room in August 2011 with  some chest discomfort that she thought was gastroesophageal reflux  disease as well as some mild dyspnea.  She had a previous stress Myoview  examination in August 2009 which showed no evidence of myocardial  ischemia.  The ejection fraction was %.  She underwent a repeat 2-D  echocardiogram on May 30, 2010, which showed severe aortic valve  stenosis with an aortic valve area of 0.52 sq. cm with a mean pressure  gradient of 86 and a maximum pressure gradient of 131.  There was  moderate mitral annular calcification with moderate mitral valve  prolapse and mild mitral regurgitation.  Left ventricle ejection  fraction was greater than 55% with mild concentric left ventricular  hypertrophy.  There was evidence of impaired left ventricular  relaxation.  The aorta appeared to be of normal size.  Right ventricle  was normal.  She subsequently underwent a cardiac catheterization  performed on June 29, 2010.  This showed normal coronary arteries.  The aortic valve area was 0.53 sq. cm with a peak pullback gradient of  60 and a peak-to-peak gradient of 100.  Cardiac index was 2.2-2.4.  The  pulmonary capillary wedge pressure was 34.  Pulmonary artery pressure  was 56/40 with a mean of 48.  Right atrial pressure was mean of 18.  Right ventricular systolic pressure was 64 with diastolic pressure of  12.  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  She said she had fairly severe pain in her right leg when this  presented.  She still has some pain down over the medial aspect of the  right lower leg.  She has also had some persistent swelling in the right  leg since her catheterization.   REVIEW OF SYSTEMS:  GENERAL:  She does report significant weight gain  over the past 2 months going from around 225-250 pounds.  She does  report significant fatigue.  EYES:  Negative.  ENT:  Negative.  She does see a dentist regularly.  ENDOCRINE:  She does have diabetes.  She denies hypothyroidism.  CARDIOVASCULAR:  She did have some chest pressure in August which she  feels was due to reflux.  She reports some exertional dyspnea as well as  some orthopnea.  She denies palpitations.  She has had some peripheral  edema, worse on the right than left.  RESPIRATORY:  She denies cough and sputum production.  She has had  occasional wheezing.  GI:  She does have a history of reflux.  She denies melena and bright  red blood per rectum.  She has never had peptic ulcer.  GU:  She denies dysuria  and hematuria.  VASCULAR:  She denies claudication and phlebitis.  NEUROLOGIC:  She denies any focal weakness or numbness.  She denies  dizziness and syncope.  She has never had a TIA or a stroke.  MUSCULOSKELETAL:  She does have arthritis.  PSYCHIATRIC:  She has a history of depression.  HEMATOLOGIC:  She denies any history of bleeding disorders or easy  bleeding.   ALLERGIES:  DEMEROL and LIDOCAINE both of which cause elevated blood  pressure.   MEDICATIONS:  1. Aspirin 81 mg daily.  2. Fish oil 1200 mg daily.  3. Janumet 50/1000 b.i.d.  4. Citalopram 40 mg daily.  5. Spironolactone 50 mg daily.  6. Ultram p.r.n. for pain.  7. Ibuprofen p.r.n. for pain.  8. Cyanocobalamin 100 mg monthly.  9. Hydrocodone p.r.n. pain.   PAST  MEDICAL HISTORY:  Significant for adult-onset diabetes since age  74.  She has a history of hypercholesterolemia and hypertension.   SOCIAL HISTORY:  She is married and lives with her husband.  She has no  children.  She is retired.  She has never smoked.  Denies alcohol use.   FAMILY HISTORY:  She denies any family history of heart valve disease.  She said one grandmother did have coronary disease but was elderly.   PHYSICAL EXAMINATION:  Vital Signs:  Blood pressure 131/81, pulse 90 and  regular, respiratory rate is 18 and unlabored.  Oxygen saturation on  room air is 91%.  General:  She is an obese white female in no distress.  HEENT:  Normocephalic and atraumatic.  Pupils are equal and reactive to  light.  Extraocular muscles are intact.  Oropharynx is clear.  Teeth are  in good condition.  Neck:  Normal carotid pulses bilaterally.  There is  a transmitted murmur to both sides of her neck.  There is no adenopathy  or thyromegaly.  Cardiac:  Regular rate and rhythm with a grade 2/6  harsh systolic murmur over the aortic valve.  Her lungs are clear.  Abdomen:  Active bowel sounds.  Abdomen is soft, obese, and nontender.  There are no palpable masses or organomegaly.  Extremities:  Mild right  lower leg edema.  Pedal pulses are palpable bilaterally.  Her skin is  warm and dry.  Neurologic exam shows her to be alert and oriented x3.  Motor and sensory exams are grossly normal.   IMAGING:  Carotid Doppler examination showed no significant internal  carotid artery stenosis bilaterally.   IMPRESSION:  The patient has critical aortic stenosis and is becoming  symptomatic.  I agree that it is time to proceed with aortic valve  replacement.  Since she is 65 years old and in relatively good condition  with normal coronary arteries, I recommended that she have a mechanical  valve.  She has no contraindication to anticoagulation and feels that  she could comply with daily Coumadin use and  continued follow up of her  INR.  I discussed the pros and cons of both mechanical and tissue valves  with her and her husband and they are in agreement with proceeding with  a mechanical valve.  I discussed the aortic valve replacement surgery  including alternatives, benefits, and risks including but not limited to  bleeding, blood transfusion, infection, stroke, myocardial infarction,  heart block requiring permanent pacemaker, organ dysfunction, and death.  She understands and would like to proceed as quickly as possible.  We  will plan to do this on Wednesday, August 01, 2010.  Evelene Croon, M.D.  Electronically Signed   BB/MEDQ  D:  07/24/2010  T:  07/25/2010  Job:  [id number]   cc:   Nanetta Batty, M.D.  Kirk Ruths, M.D.

## 2011-02-15 NOTE — Op Note (Signed)
NAME:  Hailey Curry, Hailey Curry                        ACCOUNT NO.:  [id number]   MEDICAL RECORD NO.:  [id number]                   PATIENT TYPE:  AMB   LOCATION:  DAY                                  FACILITY:  [facility]   PHYSICIAN:  R. Roetta Sessions, M.D.              DATE OF BIRTH:  1946-07-17   DATE OF PROCEDURE:  03/04/2003  DATE OF DISCHARGE:                                 OPERATIVE REPORT   PROCEDURE:  Colonoscopy with biopsy.   INDICATIONS FOR PROCEDURE:  The patient is a 65 year old lady with a history  of colonic adenomatous polyps resected at the time of colonoscopy in 1999.  She is here for surveillance.  She is devoid of any lower GI tract symptoms.  Colonoscopy is now being done as a surveillance maneuver.  This approach has  been discussed with the patient previously and again today at the bedside.  The potential risks, benefits, and alternatives have been reviewed and  questions answered.  She is agreeable.  Please see my handwritten H&P for  more information.   PROCEDURE:  O2 saturation, blood pressure, pulses, and respirations were  monitored throughout the entire procedure.  Conscious sedation was with  Versed 5 mg IV, Fentanyl 125 mcg IV in divided doses.  The instrument used  was the Olympus video chip adult colonoscope.   FINDINGS:  Digital rectal examination revealed no abnormalities.   ENDOSCOPIC FINDINGS:  The prep was adequate.   Rectum:  Examination of the rectal mucosa including retroflex view of the  anal verge revealed only internal hemorrhoids.   Colon:  The colonic mucosa was surveyed from the rectosigmoid junction  through the left, transverse, right colon to the area of the appendiceal  orifice, ileocecal valve, and cecum.  These structures were well-seen and  photographed for the record.  The patient had scattered left-sided  diverticula and a 2-mm polyp on the ileocecal valve which was cold  biopsied/removed.  From this level, the scope was slowly  withdrawn.  All  previously mentioned mucosal surfaces were again seen, and no other  abnormalities were observed.  The patient tolerated the procedure well and  was reactive in endoscopy.   IMPRESSION:  1. Internal hemorrhoids.  Otherwise normal rectum.  2. Scattered left-sided diverticula, diminutive polyp at ileocecal valve     cold biopsied/removed.  The remainder of the colonic mucosa appeared     normal.    RECOMMENDATIONS:  1. Follow up on pathology.  2. Further recommendations to follow.                                               Jonathon Bellows, M.D.    RMR/MEDQ  D:  03/04/2003  T:  03/04/2003  Job:  [id number]   cc:   Dionne Ano.  Regino Schultze, M.D.  [address]  [place]  [place] [address]  Fax: [phone]   Tilda Burrow, M.D.  [address] [place]  [place] [address]  Fax: [phone]

## 2011-02-15 NOTE — Op Note (Signed)
NAME:  Hailey Curry, Hailey Curry                        ACCOUNT NO.:  [id number]   MEDICAL RECORD NO.:  [id number]                   PATIENT TYPE:  OUT   LOCATION:  [facility]                                 FACILITY:  [facility]   PHYSICIAN:  Rose Phi. Maple Hudson, M.D.                DATE OF BIRTH:  27-Jan-1946   DATE OF PROCEDURE:  12/16/2003  DATE OF DISCHARGE:  12/16/2003                                 OPERATIVE REPORT   PREOPERATIVE DIAGNOSIS:  Stage I carcinoma of the right breast.   POSTOPERATIVE DIAGNOSIS:  Stage I carcinoma of the right breast.   OPERATION:  1. Blue dye injection.  2. Right partial mastectomy with needle localization and specimen     mammography.  3. Right sentinel lymph node biopsy.   SURGEON:  Rose Phi. Maple Hudson, M.D.   ANESTHESIA:  General.   OPERATIVE PROCEDURE:  Prior to coming to the operating room, a localization  wire had been fixed for the lesion in the right breast.  In addition, 1 mCi  of Technetium sulfur colloid was injected intradermally.   After suitable general anesthesia was induced, the patient was placed in the  supine position with the arms on the arm board.  A mixture of 2 mL of  methylene blue and 3 mL of saline was then injected in the subareolar tissue  and the breast massaged for about three minutes.   After prepping and draping the breast and axilla, a curved incision in the  upper outer quadrant to the 9 o'clock position of the right breast was then  outlined using the previously-placed wire as a guide.  We then dissected out  and removed the wire and the surrounding tissue.  Specimen oriented for the  pathologist.  It was then submitted for a specimen mammogram, to be followed  by evaluation by the pathologist.   While that was being done, a transverse axillary incision was made with  dissection through the subcutaneous tissue to the clavipectoral fascia.  Deep to this, after a fairly prolonged dissection because of the size and  depth, a hot, blue  lymph node was removed.  It had counts in excess of 1000.  It was bright blue.  We removed it, and there were no other blue, hot, or  palpable nodes.  Touch preps of the node were negative and the margins were  clean.  Incisions were closed with 3-0 Vicryl and staples.  Dressings were  applied and the patient was transferred to the recovery room in satisfactory  condition, having tolerated the procedure well.                                               Rose Phi. Maple Hudson, M.D.    PRY/MEDQ  D:  12/16/2003  T:  12/19/2003  Job:  [id number]

## 2011-07-15 ENCOUNTER — Other Ambulatory Visit (HOSPITAL_COMMUNITY): Payer: Self-pay | Admitting: Hematology and Oncology

## 2011-07-15 DIAGNOSIS — Z139 Encounter for screening, unspecified: Secondary | ICD-10-CM

## 2011-07-19 ENCOUNTER — Emergency Department (HOSPITAL_COMMUNITY)
Admission: EM | Admit: 2011-07-19 | Discharge: 2011-07-19 | Disposition: A | Discharge status: Home / Self Care | Payer: Medicare Other | Attending: Emergency Medicine | Admitting: Emergency Medicine

## 2011-07-19 ENCOUNTER — Encounter: Payer: Self-pay | Admitting: Emergency Medicine

## 2011-07-19 ENCOUNTER — Emergency Department (HOSPITAL_COMMUNITY): Payer: Medicare Other

## 2011-07-19 DIAGNOSIS — Z7901 Long term (current) use of anticoagulants: Secondary | ICD-10-CM | POA: Insufficient documentation

## 2011-07-19 DIAGNOSIS — Z859 Personal history of malignant neoplasm, unspecified: Secondary | ICD-10-CM | POA: Insufficient documentation

## 2011-07-19 DIAGNOSIS — I10 Essential (primary) hypertension: Secondary | ICD-10-CM | POA: Insufficient documentation

## 2011-07-19 DIAGNOSIS — S0990XA Unspecified injury of head, initial encounter: Secondary | ICD-10-CM | POA: Insufficient documentation

## 2011-07-19 DIAGNOSIS — Z951 Presence of aortocoronary bypass graft: Secondary | ICD-10-CM | POA: Insufficient documentation

## 2011-07-19 DIAGNOSIS — R011 Cardiac murmur, unspecified: Secondary | ICD-10-CM | POA: Insufficient documentation

## 2011-07-19 DIAGNOSIS — E119 Type 2 diabetes mellitus without complications: Secondary | ICD-10-CM | POA: Insufficient documentation

## 2011-07-19 DIAGNOSIS — IMO0002 Reserved for concepts with insufficient information to code with codable children: Secondary | ICD-10-CM | POA: Insufficient documentation

## 2011-07-19 LAB — PROTIME-INR
INR: 2.78 — ABNORMAL HIGH (ref 0.00–1.49)
Prothrombin Time: 29.8 seconds — ABNORMAL HIGH (ref 11.6–15.2)

## 2011-07-19 NOTE — ED Provider Notes (Signed)
History     CSN: [id number] Arrival date & time: 07/19/2011  7:52 PM   First MD Initiated Contact with Patient 07/19/11 1955      Chief Complaint  Patient presents with  . Head Injury    (Consider location/radiation/quality/duration/timing/severity/associated sxs/prior treatment) Patient is a 65 y.o. female presenting with head injury. The history is provided by the patient.  Head Injury  The incident occurred 3 to 5 hours ago. She came to the ER via walk-in. There was no loss of consciousness. There was no blood loss. The quality of the pain is described as dull. The pain is mild. The pain has been constant since the injury. Pertinent negatives include no numbness, no vomiting and no weakness.   patient hit her head on her trunk lid. She states that she was closing the trunk lid with the automatic closer and didn't get out of the way soon enough. No loss of consciousness. She is on Coumadin for aortic valve replacement. Her INR yesterday was 3 point no loss of consciousness no bleeding. She has a little pain on her scalp, but no headache per the patient. No numbness or weakness. Past Medical History  Diagnosis Date  . Diabetes mellitus   . Hypertension   . High cholesterol   . Cancer     Past Surgical History  Procedure Date  . Coronary artery bypass graft   . Breast surgery     History reviewed. No pertinent family history.  History  Substance Use Topics  . Smoking status: Never Smoker   . Smokeless tobacco: Not on file  . Alcohol Use: No    OB History    Grav Para Term Preterm Abortions TAB SAB Ect Mult Living                  Review of Systems  Constitutional: Negative for activity change and appetite change.  HENT: Negative for neck stiffness.   Eyes: Negative for pain.  Respiratory: Negative for chest tightness and shortness of breath.   Cardiovascular: Negative for chest pain and leg swelling.  Gastrointestinal: Negative for nausea, vomiting, abdominal  pain and diarrhea.  Genitourinary: Negative for flank pain.  Musculoskeletal: Negative for back pain.  Skin: Negative for rash.  Neurological: Negative for weakness, numbness and headaches.  Hematological: Bruises/bleeds easily.  Psychiatric/Behavioral: Negative for behavioral problems.    Allergies  Demerol  Home Medications  No current outpatient prescriptions on file.  BP 178/67  Pulse 84  Temp(Src) 97.7 F (36.5 C) (Oral)  Resp 20  Ht 5' 3.5" (1.613 m)  Wt 253 lb (114.76 kg)  BMI 44.11 kg/m2  SpO2 95%  Physical Exam  Nursing note and vitals reviewed. Constitutional: She is oriented to person, place, and time. She appears well-developed and well-nourished.  HENT:  Head: Normocephalic.       Mild tenderness to right scalp.   Eyes: EOM are normal. Pupils are equal, round, and reactive to light.  Neck: Normal range of motion. Neck supple.  Cardiovascular: Normal rate and regular rhythm.   Murmur heard. Pulmonary/Chest: Effort normal and breath sounds normal. No respiratory distress. She has no wheezes. She has no rales.  Abdominal: Soft. Bowel sounds are normal. She exhibits no distension. There is no tenderness. There is no rebound and no guarding.  Musculoskeletal: Normal range of motion.  Neurological: She is alert and oriented to person, place, and time. No cranial nerve deficit.  Skin: Skin is warm and dry.  Psychiatric: She has a  normal mood and affect. Her speech is normal.    ED Course  Procedures (including critical care time)   Labs Reviewed  PROTIME-INR   No results found.   No diagnosis found.    MDM  Hit head on coumadin. INR of 2.8. Head CT negative. discharged        Juliet Rude. Rubin Payor, MD 07/19/11 2158

## 2011-07-19 NOTE — ED Notes (Signed)
Pt was head in head with car trunk this am. Pt told by pcp to come to ed for evaluation because she is on coumadin. Denies loc/laceration/dizziness. C/o soreness.

## 2011-11-19 IMAGING — CR DG CHEST 2V
2 series · 2 of 2 positions shown · non-contrast
Comparison: 04/25/2010

CLINICAL DATA: Aortic stenosis.  Preop respiratory evaluation.

CHEST - 2 VIEW

[view not recorded (1 of 2)]
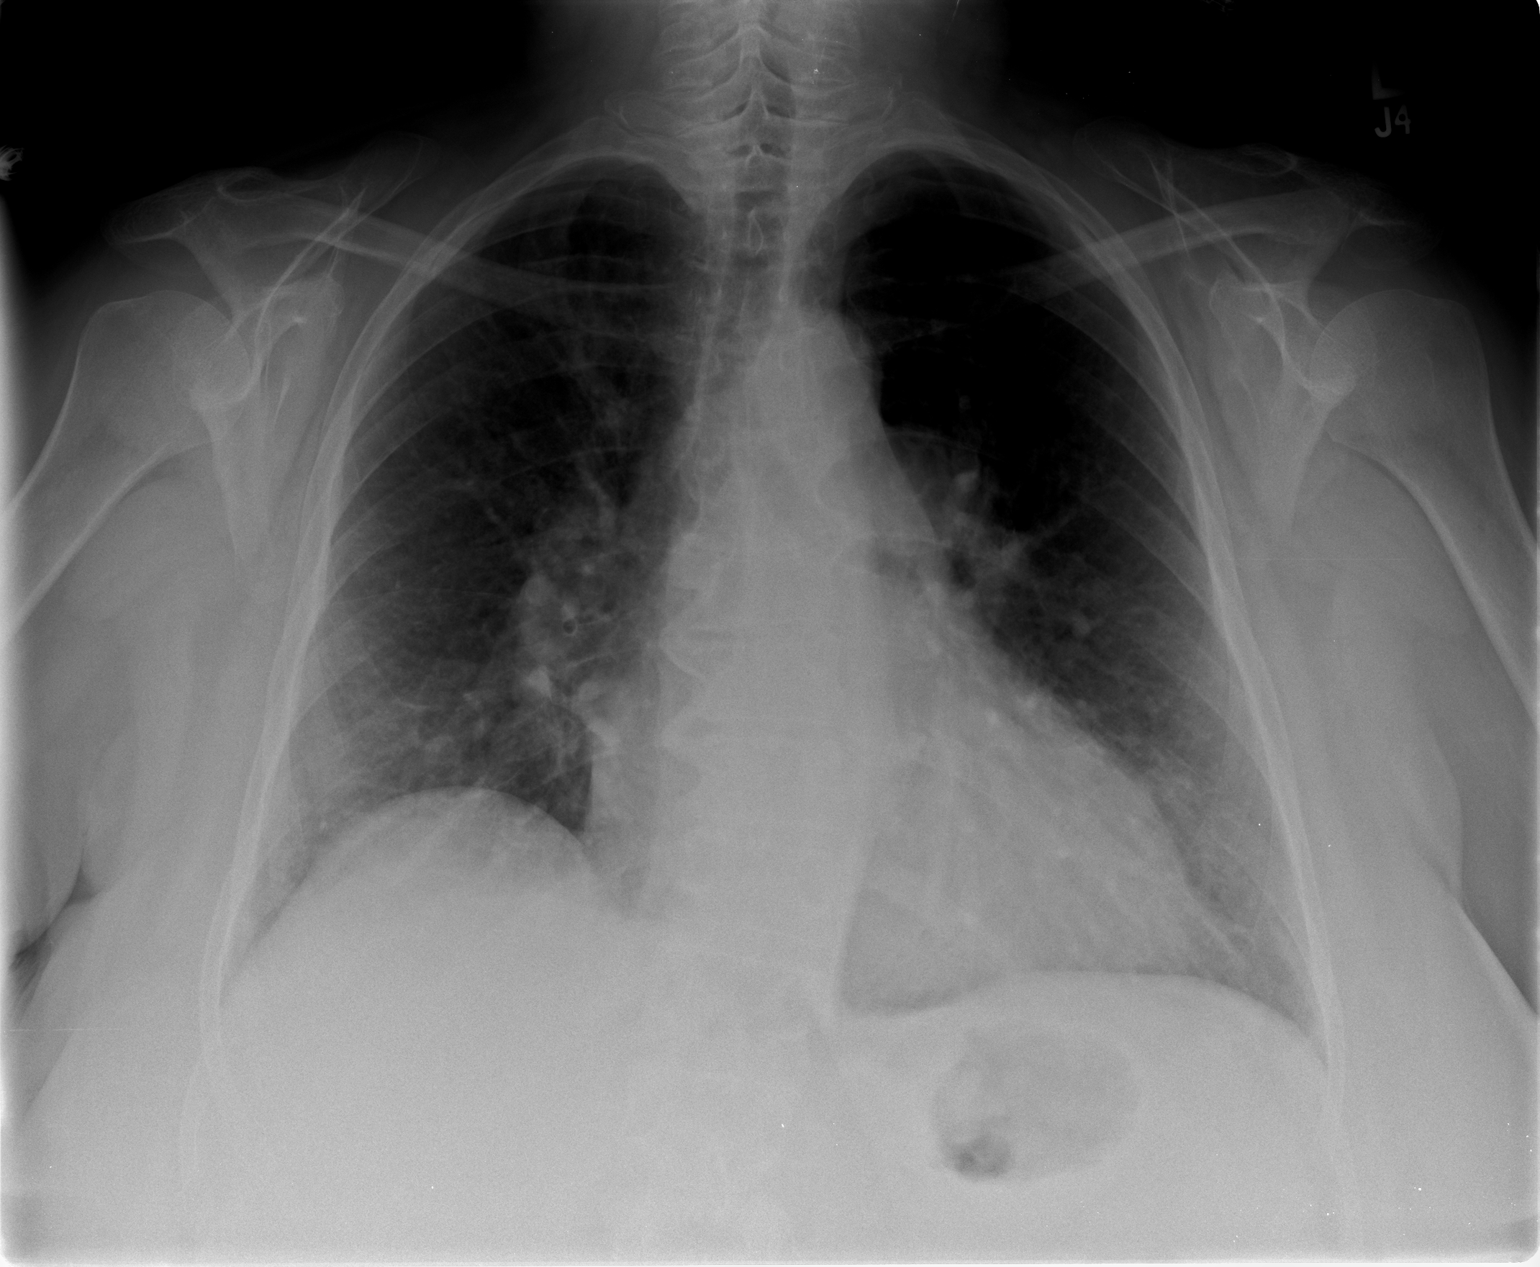

[view not recorded (2 of 2)]
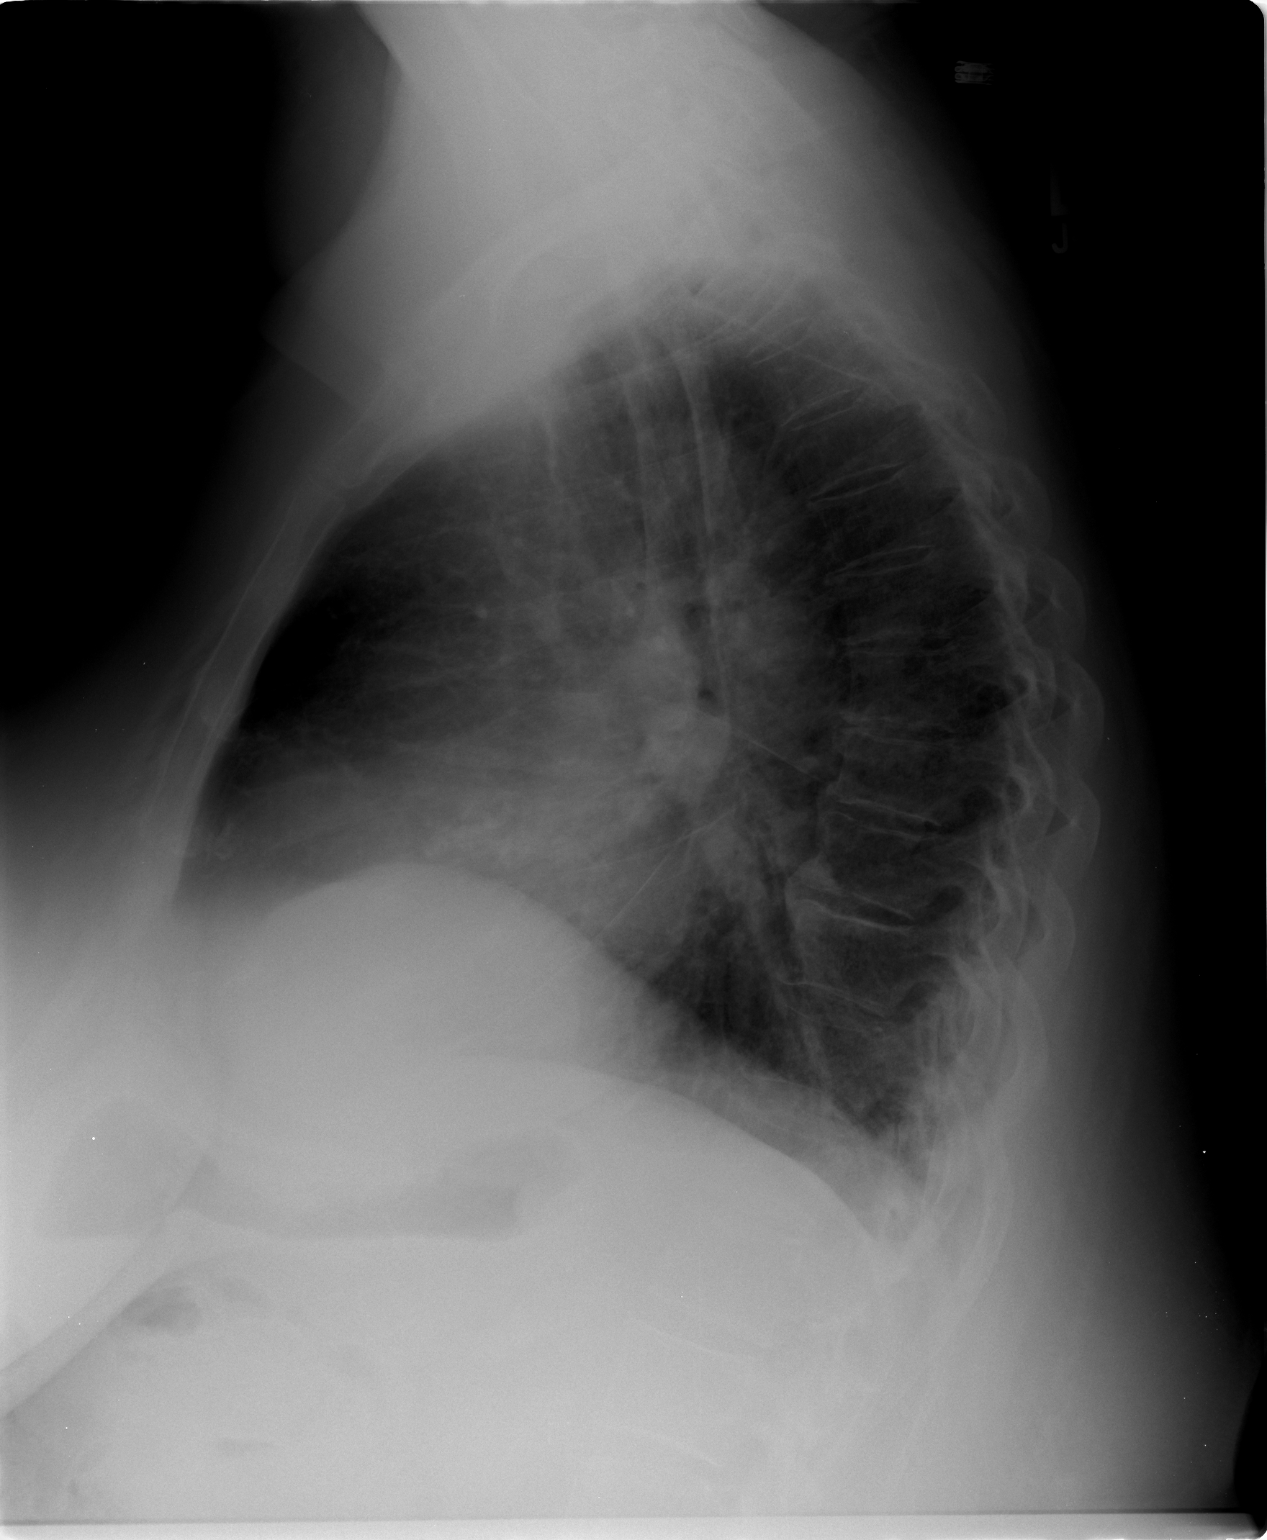

[2 of 2 positions shown; findings below may reference images not displayed]

FINDINGS: Heart size is mildly enlarged.  Negative for heart
failure.  Negative for infiltrate or effusion or mass lesion.
IMPRESSION: No active cardiopulmonary disease.

## 2011-11-21 IMAGING — CR DG CHEST 1V PORT
1 series · 1 of 1 positions shown · non-contrast
Comparison: 07/30/2010

CLINICAL DATA: Postop aortic valve replacement.

PORTABLE CHEST - 1 VIEW

[view not recorded]
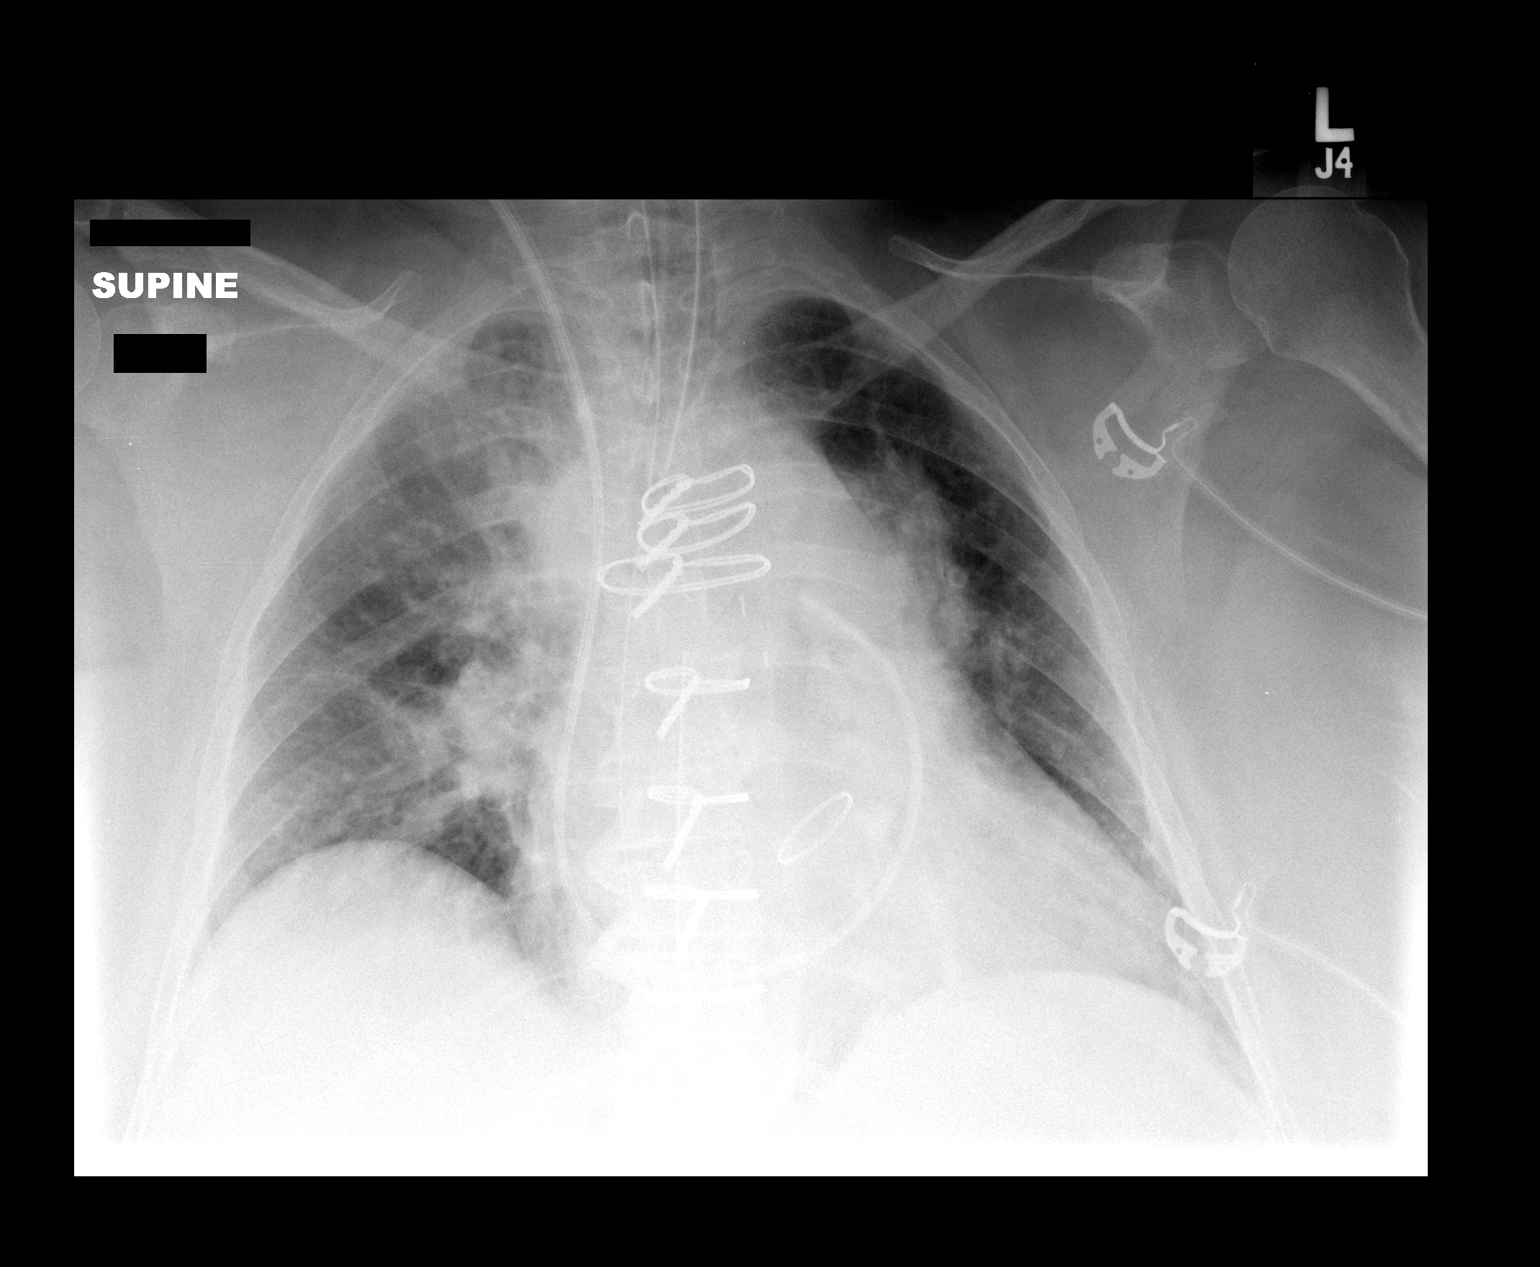

[1 of 1 positions shown; findings below may reference images not displayed]

FINDINGS: Endotracheal tube is in satisfactory position.
Nasogastric tube is followed into the stomach, with the tip
projecting beyond the inferior boundary of the film.  Right IJ Swan-
Ganz catheter tip projects over the proximal right pulmonary
artery.  Mediastinal drain, left chest tube and epicardial pacer
wires are in place.  Seven intact sternotomy wires.

Cardiomediastinal silhouette is enlarged and accentuated by AP
supine technique.  Mild central pulmonary vascular congestion or
vascular crowding.  No pneumothorax.
IMPRESSION: Low lung volumes with central pulmonary vascular congestion or
vascular crowding.

## 2011-11-22 IMAGING — CR DG CHEST 1V PORT
1 series · 1 of 1 positions shown · non-contrast
Comparison: Chest radiograph 08/01/2010

CLINICAL DATA: Aortic stenosis

PORTABLE CHEST - 1 VIEW

[view not recorded]
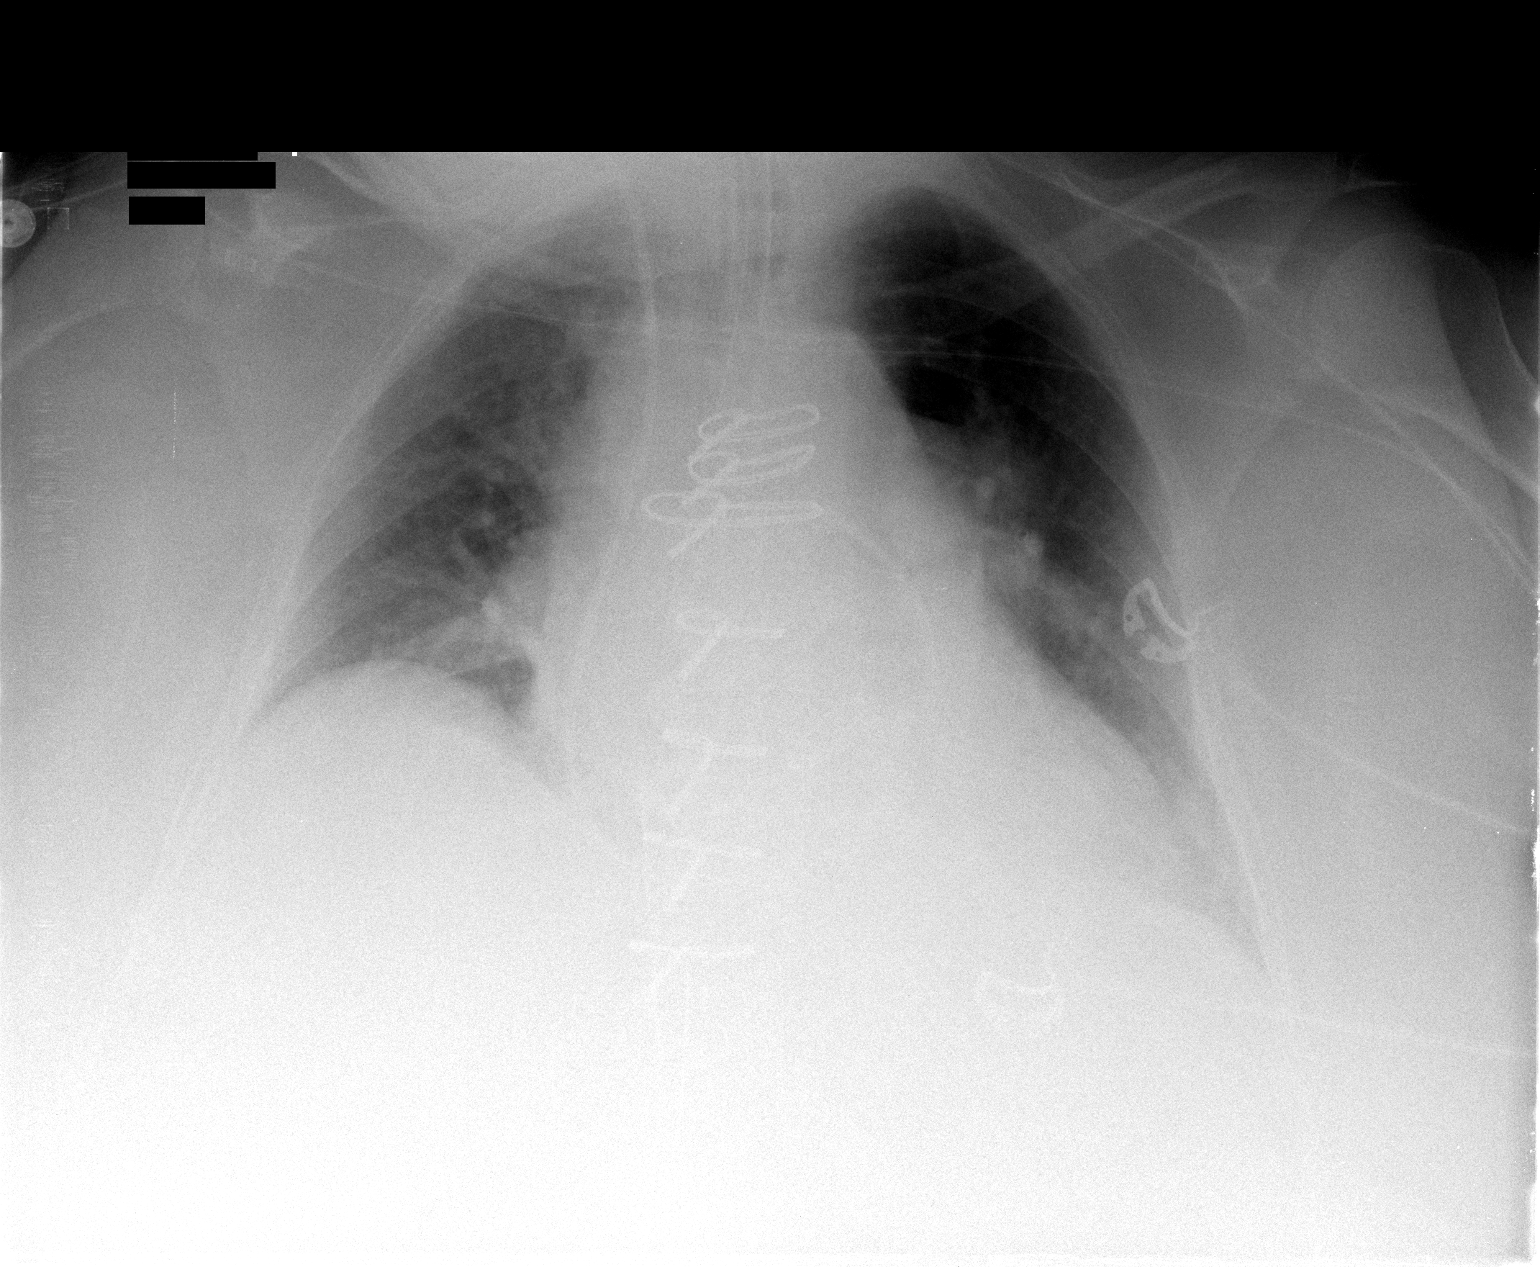

[1 of 1 positions shown; findings below may reference images not displayed]

FINDINGS: Endotracheal tube, NG tube, and Swan-Ganz catheter are
unchanged.  Mediastinal drains are unchanged.  There are low lung
volumes similar to prior.  There is interval decrease in the
central venous congestion pattern seen on prior.  No pneumothorax.
IMPRESSION: 1.  Stable support apparatus.
2.  Decrease in central venous congestion.

## 2011-11-23 IMAGING — CR DG CHEST 1V PORT
1 series · 1 of 1 positions shown · non-contrast
Comparison: Portable chest x-ray of 08/02/2010

CLINICAL DATA: Aortic valve replacement for severe aortic stenosis

PORTABLE CHEST - 1 VIEW

[AP]
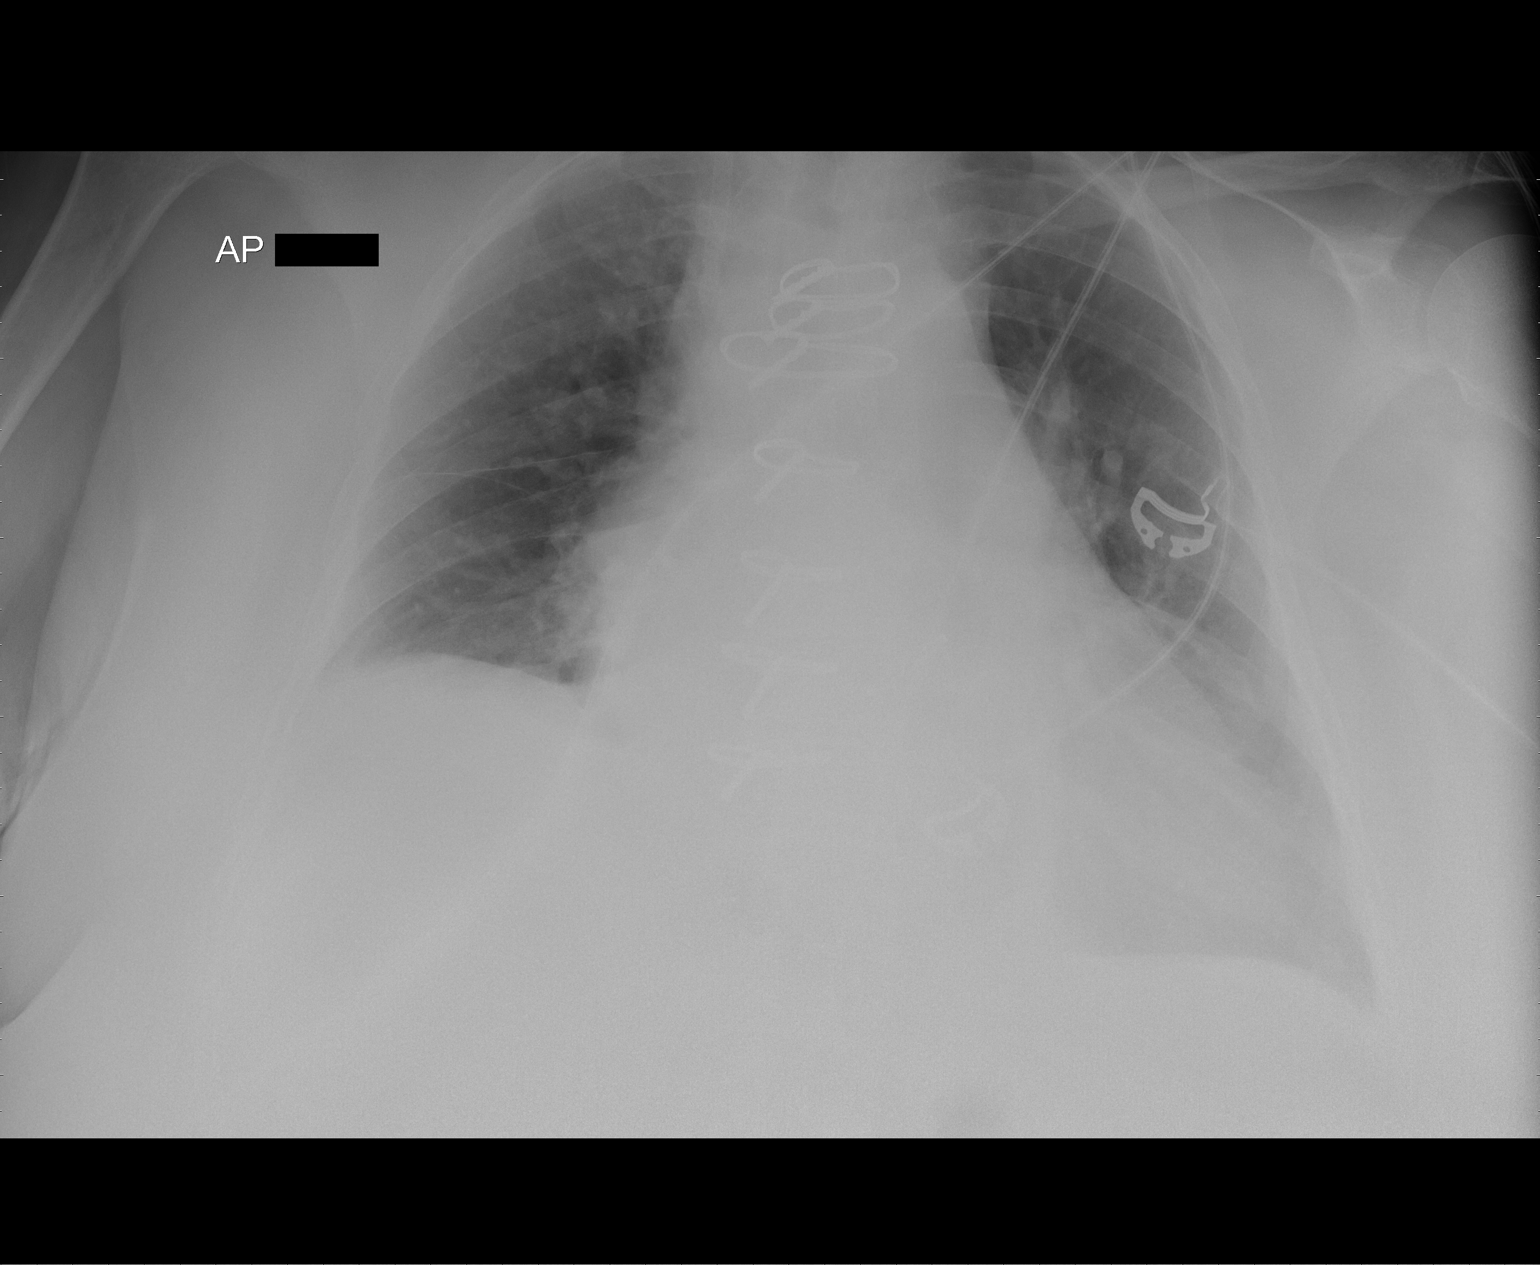

[1 of 1 positions shown; findings below may reference images not displayed]

FINDINGS: The endotracheal tube and Swan-Ganz catheter have been
removed, with a venous sheath remaining in the SVC.  No
pneumothorax is seen.  No significant change  in aeration is noted.
Cardiomegaly is stable.
IMPRESSION: Endotracheal tube and Swan-Ganz catheter removed.  No significant
change in aeration.

## 2012-02-03 ENCOUNTER — Ambulatory Visit (HOSPITAL_COMMUNITY): Payer: Medicare Other

## 2012-02-10 ENCOUNTER — Ambulatory Visit (HOSPITAL_COMMUNITY)
Admission: RE | Admit: 2012-02-10 | Discharge: 2012-02-10 | Disposition: A | Discharge status: Home / Self Care | Payer: Medicare Other | Source: Ambulatory Visit | Attending: Hematology and Oncology | Admitting: Hematology and Oncology

## 2012-02-10 DIAGNOSIS — Z139 Encounter for screening, unspecified: Secondary | ICD-10-CM

## 2012-02-10 DIAGNOSIS — Z1231 Encounter for screening mammogram for malignant neoplasm of breast: Secondary | ICD-10-CM | POA: Insufficient documentation

## 2012-02-21 ENCOUNTER — Encounter (INDEPENDENT_AMBULATORY_CARE_PROVIDER_SITE_OTHER): Payer: Medicare Other | Admitting: Hematology and Oncology

## 2012-02-21 DIAGNOSIS — C50919 Malignant neoplasm of unspecified site of unspecified female breast: Secondary | ICD-10-CM

## 2012-02-21 DIAGNOSIS — E119 Type 2 diabetes mellitus without complications: Secondary | ICD-10-CM

## 2012-02-21 DIAGNOSIS — I10 Essential (primary) hypertension: Secondary | ICD-10-CM

## 2012-02-21 DIAGNOSIS — I359 Nonrheumatic aortic valve disorder, unspecified: Secondary | ICD-10-CM

## 2012-03-11 ENCOUNTER — Encounter: Payer: Self-pay | Admitting: Internal Medicine

## 2012-03-31 ENCOUNTER — Emergency Department (HOSPITAL_COMMUNITY)
Admission: EM | Admit: 2012-03-31 | Discharge: 2012-03-31 | Disposition: A | Discharge status: Home / Self Care | Payer: No Typology Code available for payment source | Attending: Emergency Medicine | Admitting: Emergency Medicine

## 2012-03-31 ENCOUNTER — Emergency Department (HOSPITAL_COMMUNITY): Payer: No Typology Code available for payment source

## 2012-03-31 ENCOUNTER — Encounter (HOSPITAL_COMMUNITY): Payer: Self-pay | Admitting: *Deleted

## 2012-03-31 DIAGNOSIS — Z7901 Long term (current) use of anticoagulants: Secondary | ICD-10-CM | POA: Insufficient documentation

## 2012-03-31 DIAGNOSIS — E119 Type 2 diabetes mellitus without complications: Secondary | ICD-10-CM | POA: Insufficient documentation

## 2012-03-31 DIAGNOSIS — Z79899 Other long term (current) drug therapy: Secondary | ICD-10-CM | POA: Insufficient documentation

## 2012-03-31 DIAGNOSIS — I10 Essential (primary) hypertension: Secondary | ICD-10-CM | POA: Insufficient documentation

## 2012-03-31 DIAGNOSIS — Z951 Presence of aortocoronary bypass graft: Secondary | ICD-10-CM | POA: Insufficient documentation

## 2012-03-31 DIAGNOSIS — Z954 Presence of other heart-valve replacement: Secondary | ICD-10-CM | POA: Insufficient documentation

## 2012-03-31 DIAGNOSIS — R0789 Other chest pain: Secondary | ICD-10-CM | POA: Insufficient documentation

## 2012-03-31 DIAGNOSIS — E7800 Pure hypercholesterolemia, unspecified: Secondary | ICD-10-CM | POA: Insufficient documentation
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
## 2012-06-03 ENCOUNTER — Other Ambulatory Visit: Payer: Self-pay | Admitting: Obstetrics and Gynecology

## 2012-06-03 ENCOUNTER — Other Ambulatory Visit (HOSPITAL_COMMUNITY)
Admission: RE | Admit: 2012-06-03 | Discharge: 2012-06-03 | Disposition: A | Discharge status: Home / Self Care | Payer: Medicare Other | Source: Ambulatory Visit | Attending: Obstetrics and Gynecology | Admitting: Obstetrics and Gynecology

## 2012-06-03 DIAGNOSIS — Z124 Encounter for screening for malignant neoplasm of cervix: Secondary | ICD-10-CM | POA: Insufficient documentation

## 2012-07-01 ENCOUNTER — Ambulatory Visit: Payer: Medicare Other | Admitting: Gastroenterology

## 2012-11-13 ENCOUNTER — Ambulatory Visit: Payer: Medicare Other | Admitting: Internal Medicine

## 2012-12-04 ENCOUNTER — Ambulatory Visit: Payer: Self-pay | Admitting: Cardiovascular Disease

## 2012-12-04 DIAGNOSIS — Z7901 Long term (current) use of anticoagulants: Secondary | ICD-10-CM

## 2012-12-04 DIAGNOSIS — Z952 Presence of prosthetic heart valve: Secondary | ICD-10-CM

## 2012-12-23 ENCOUNTER — Encounter (HOSPITAL_COMMUNITY): Payer: Self-pay | Admitting: Pharmacy Technician

## 2012-12-23 ENCOUNTER — Ambulatory Visit (INDEPENDENT_AMBULATORY_CARE_PROVIDER_SITE_OTHER): Payer: Medicare Other | Admitting: Internal Medicine

## 2012-12-23 ENCOUNTER — Other Ambulatory Visit: Payer: Self-pay | Admitting: Internal Medicine

## 2012-12-23 ENCOUNTER — Encounter: Payer: Self-pay | Admitting: Internal Medicine

## 2012-12-23 VITALS — BP 152/68 | HR 82 | Temp 97.4°F | Ht 63.5 in | Wt 258.0 lb

## 2012-12-23 DIAGNOSIS — D126 Benign neoplasm of colon, unspecified: Secondary | ICD-10-CM

## 2012-12-23 MED ORDER — PEG-KCL-NACL-NASULF-NA ASC-C 100 G PO SOLR: 1.0000 | ORAL | Status: DC

## 2012-12-23 NOTE — Progress Notes (Signed)
p 

## 2012-12-23 NOTE — Progress Notes (Signed)
Primary Care Physician:  Colette Ribas, MD Primary Gastroenterologist:  Dr. Jena Gauss  Pre-Procedure History & Physical: HPI:  Hailey Curry is a 67 y.o. female here for consideration of surveillance colonoscopy. She had multiple adenomas and one tubulovillous adenoma removed from her rectum in 2010. She is due for surveillance now. She has a mechanical aortic valve and is chronically anticoagulated. She does have a tendency towards postprandial diarrhea. She is a diabetic and takes metformin.  Past Medical History  Diagnosis Date  . Diabetes mellitus   . Hypertension   . High cholesterol   . Breast cancer   . Colonic adenoma     Past Surgical History  Procedure Laterality Date  . Coronary artery bypass graft    . Breast surgery    . Aortic valve replacement    . Colonoscopy  03/04/2003    Dr. Jena Gauss- internal hemorrhoids o/w normal rectum, scattered L sided diveritula  . Colonoscopy  04/12/2009    Dr. Frankey Poot polyp, L dided diverticula, tubular adenoma on bx    Prior to Admission medications   Medication Sig Start Date End Date Taking? Authorizing Provider  Carboxymethylcellulose Sodium (THERATEARS) 0.25 % SOLN Apply 1-2 drops to eye daily as needed.   Yes Historical Provider, MD  cyanocobalamin (,VITAMIN B-12,) 1000 MCG/ML injection Inject 1,000 mcg into the muscle every 30 (thirty) days.   Yes Historical Provider, MD  escitalopram (LEXAPRO) 20 MG tablet Take 20 mg by mouth daily.  12/09/12  Yes Historical Provider, MD  gabapentin (NEURONTIN) 300 MG capsule Take 300 mg by mouth daily as needed.   Yes Historical Provider, MD  Liraglutide (VICTOZA) 18 MG/3ML SOLN Inject 1.8 mg into the skin every morning.    Yes Historical Provider, MD  loratadine (CLARITIN) 10 MG tablet Take 10 mg by mouth every morning.   Yes Historical Provider, MD  metFORMIN (GLUCOPHAGE-XR) 500 MG 24 hr tablet Take 500 mg by mouth 2 (two) times daily.    Yes Historical Provider, MD  mometasone (NASONEX) 50  MCG/ACT nasal spray Place 2 sprays into the nose daily as needed.   Yes Historical Provider, MD  olmesartan-hydrochlorothiazide (BENICAR HCT) 20-12.5 MG per tablet Take 1 tablet by mouth every morning.   Yes Historical Provider, MD  Vitamin D, Ergocalciferol, (DRISDOL) 50000 UNITS CAPS Take 50,000 Units by mouth every 7 (seven) days. On Wednesday EVENING   Yes Historical Provider, MD  warfarin (COUMADIN) 5 MG tablet Take 5-7.5 mg by mouth as directed. Take (one &one-half tablet) 7.5 mg on Monday, Wednesday, Friday. Take (one tablet 5 mg on Tuesday, Thursday, Saturday and Sunday)   Yes Historical Provider, MD  citalopram (CELEXA) 40 MG tablet Take 40 mg by mouth every evening.    Historical Provider, MD    Allergies as of 12/23/2012 - Review Complete 12/23/2012  Allergen Reaction Noted  . Demerol Other (See Comments) 07/19/2011    No family history on file.  History   Social History  . Marital Status: Married    Spouse Name: N/A    Number of Children: N/A  . Years of Education: N/A   Occupational History  . Not on file.   Social History Main Topics  . Smoking status: Never Smoker   . Smokeless tobacco: Not on file  . Alcohol Use: No  . Drug Use: No  . Sexually Active:    Other Topics Concern  . Not on file   Social History Narrative  . No narrative on file  Review of Systems: See HPI, otherwise negative ROS  Physical Exam: BP 152/68  Pulse 82  Temp(Src) 97.4 F (36.3 C) (Oral)  Ht 5' 3.5" (1.613 m)  Wt 258 lb (117.028 kg)  BMI 44.98 kg/m2 General:   Alert,  Well-developed, well-nourished, pleasant and cooperative in NAD Skin:  Intact without significant lesions or rashes. Eyes:  Sclera clear, no icterus.   Conjunctiva pink. Ears:  Normal auditory acuity. Nose:  No deformity, discharge,  or lesions. Mouth:  No deformity or lesions. Neck:  Supple; no masses or thyromegaly. No significant cervical adenopathy. Lungs:  Clear throughout to auscultation.   No  wheezes, crackles, or rhonchi. No acute distress. Heart:  Regular rate and rhythm; no murmurs, clicks, rubs,  or gallops. Abdomen: Non-distended, normal bowel sounds.  Soft and nontender without appreciable mass or hepatosplenomegaly.  Pulses:  Normal pulses noted. Extremities:  Without clubbing or edema.  Impression/Plan:  Very pleasant obese 67 year old lady with history tubulovillous adenomas removed from her rectum/colon in 2010. She has a tendency towards diarrhea these days. She is now anticoagulated because of a mechanical aortic valve. She's followed by Dr. Allyson Sabal for this issue.  Altered bowel function may be simply curable bowel syndrome. Other considerations could be medication effect, diabetic enteropathy or even microscopic colitis.  Recommendations: I will offer this nice lady a surveillance colonoscopy in the near future.The risks, benefits, limitations, alternatives and imponderables have been reviewed with the patient. Questions have been answered. All parties are agreeable.  As far as management of her anticoagulation is concerned, I suspect she will need bridging with Lovenox. We will make contact with Dr. Hazle Coca office to get their recommendations for this upcoming procedure.

## 2012-12-23 NOTE — Patient Instructions (Addendum)
Schedule a colonoscopy in about 2 weeks  Will be in touch regarding management of coumadin after we speak to Dr. Allyson Sabal

## 2012-12-24 ENCOUNTER — Telehealth: Payer: Self-pay

## 2012-12-24 DIAGNOSIS — Z7901 Long term (current) use of anticoagulants: Secondary | ICD-10-CM

## 2012-12-24 NOTE — Telephone Encounter (Signed)
Pts weight is 117kg, Per RMR pt needs lovenox 110mg  #16 Stop coumadin 4 days prior to procedure, start lovenox 3 days prior to procedure. No lovenox on day of procedure. RMR will give further coumadin/lovenox instructions after procedure.   Pt is aware and wants rx sent to [facility].   Spoke with pharmacist, lovenox only comes in 100mg  and 120mg . Do you want pt to use the 120mg  or do you want her to waste 10mg  to make it 110mg ? Please advise.

## 2012-12-24 NOTE — Telephone Encounter (Signed)
120 mg

## 2012-12-24 NOTE — Telephone Encounter (Signed)
Sent letter to Dr. Allyson Sabal to see if pt needed a lovenox bridge for her procedure. We got a fax back from his office stating- yes pt needs lovenox bridge.   We will need rx and instructions on this please.

## 2012-12-25 NOTE — Telephone Encounter (Signed)
rx called to pharmacy, pt lovenox instructions, prep instructions  and prep sample at the front desk. Pt stated she would pick it up on Monday.

## 2012-12-25 NOTE — Telephone Encounter (Signed)
Thanks

## 2013-01-06 ENCOUNTER — Ambulatory Visit (HOSPITAL_COMMUNITY)
Admission: RE | Admit: 2013-01-06 | Discharge: 2013-01-06 | Disposition: A | Discharge status: Home / Self Care | Payer: Medicare Other | Source: Ambulatory Visit | Attending: Internal Medicine | Admitting: Internal Medicine

## 2013-01-06 ENCOUNTER — Encounter (HOSPITAL_COMMUNITY)
Admission: RE | Disposition: A | Discharge status: Home / Self Care | Payer: Self-pay | Source: Ambulatory Visit | Attending: Internal Medicine

## 2013-01-06 ENCOUNTER — Encounter (HOSPITAL_COMMUNITY): Payer: Self-pay

## 2013-01-06 DIAGNOSIS — D126 Benign neoplasm of colon, unspecified: Secondary | ICD-10-CM

## 2013-01-06 DIAGNOSIS — Z860100 Personal history of colon polyps, unspecified: Secondary | ICD-10-CM | POA: Insufficient documentation

## 2013-01-06 DIAGNOSIS — D128 Benign neoplasm of rectum: Secondary | ICD-10-CM | POA: Insufficient documentation

## 2013-01-06 DIAGNOSIS — Z01812 Encounter for preprocedural laboratory examination: Secondary | ICD-10-CM | POA: Insufficient documentation

## 2013-01-06 DIAGNOSIS — E119 Type 2 diabetes mellitus without complications: Secondary | ICD-10-CM | POA: Insufficient documentation

## 2013-01-06 DIAGNOSIS — Z7901 Long term (current) use of anticoagulants: Secondary | ICD-10-CM | POA: Insufficient documentation

## 2013-01-06 DIAGNOSIS — Z1211 Encounter for screening for malignant neoplasm of colon: Secondary | ICD-10-CM

## 2013-01-06 DIAGNOSIS — K620 Anal polyp: Secondary | ICD-10-CM

## 2013-01-06 DIAGNOSIS — I10 Essential (primary) hypertension: Secondary | ICD-10-CM | POA: Insufficient documentation

## 2013-01-06 DIAGNOSIS — D129 Benign neoplasm of anus and anal canal: Secondary | ICD-10-CM | POA: Insufficient documentation

## 2013-01-06 DIAGNOSIS — Z951 Presence of aortocoronary bypass graft: Secondary | ICD-10-CM | POA: Insufficient documentation

## 2013-01-06 DIAGNOSIS — K621 Rectal polyp: Secondary | ICD-10-CM

## 2013-01-06 HISTORY — PX: COLONOSCOPY: SHX5424

## 2013-01-06 LAB — GLUCOSE, CAPILLARY: Glucose-Capillary: 133 mg/dL — ABNORMAL HIGH (ref 70–99)

## 2013-01-06 SURGERY — COLONOSCOPY
Anesthesia: Moderate Sedation

## 2013-01-06 MED ORDER — MIDAZOLAM HCL 5 MG/5ML IJ SOLN
INTRAMUSCULAR | Status: AC
  Filled 2013-01-06: qty 10

## 2013-01-06 MED ORDER — STERILE WATER FOR IRRIGATION IR SOLN
Status: DC | PRN
  Administered 2013-01-06: 13:00:00

## 2013-01-06 MED ORDER — SODIUM CHLORIDE 0.9 % IV SOLN
INTRAVENOUS | Status: DC
  Administered 2013-01-06: 12:00:00 via INTRAVENOUS

## 2013-01-06 MED ORDER — FENTANYL CITRATE 0.05 MG/ML IJ SOLN
INTRAMUSCULAR | Status: AC
  Filled 2013-01-06: qty 4

## 2013-01-06 MED ORDER — ONDANSETRON HCL 4 MG/2ML IJ SOLN
INTRAMUSCULAR | Status: AC
  Filled 2013-01-06: qty 2

## 2013-01-06 MED ORDER — MIDAZOLAM HCL 5 MG/5ML IJ SOLN
INTRAMUSCULAR | Status: DC | PRN
  Administered 2013-01-06 (×4): 1 mg via INTRAVENOUS
  Administered 2013-01-06: 2 mg via INTRAVENOUS
  Administered 2013-01-06 (×2): 1 mg via INTRAVENOUS

## 2013-01-06 MED ORDER — FENTANYL CITRATE 0.05 MG/ML IJ SOLN
INTRAMUSCULAR | Status: DC | PRN
  Administered 2013-01-06 (×2): 25 ug via INTRAVENOUS
  Administered 2013-01-06: 50 ug via INTRAVENOUS

## 2013-01-06 MED ORDER — ONDANSETRON HCL 4 MG/2ML IJ SOLN
INTRAMUSCULAR | Status: DC | PRN
  Administered 2013-01-06: 4 mg via INTRAVENOUS

## 2013-01-06 NOTE — Op Note (Signed)
[facility] [address] [place], [address]   COLONOSCOPY PROCEDURE REPORT  PATIENT: Hailey Curry, Hailey Curry  MR#:         #[id number] BIRTHDATE: 09-06-1946 , 67  yrs. old GENDER: Female ENDOSCOPIST: R.  Roetta Sessions, MD FACP FACG REFERRED BY:  Assunta Found, M.D.  Runell Gess, M.D. PROCEDURE DATE:  01/06/2013 PROCEDURE:     Colonoscopy with multiple snare polypectomies  INDICATIONS:  History of colonic adenoma  INFORMED CONSENT:  The risks, benefits, alternatives and imponderables including but not limited to bleeding, perforation as well as the possibility of a missed lesion have been reviewed.  The potential for biopsy, lesion removal, etc. have also been discussed.  Questions have been answered.  All parties agreeable. Please see the history and physical in the medical record for more information.  MEDICATIONS:   Versed 8 mg IV and fentanyl 100 mcg IV and Zofran 4 mg IV  DESCRIPTION OF PROCEDURE:  After a digital rectal exam was performed, the EC-[id number] ([id number])  colonoscope was advanced from the anus through the rectum and colon to the area of the cecum, ileocecal valve and appendiceal orifice.  The cecum was deeply intubated.  These structures were well-seen and photographed for the record.  From the level of the cecum and ileocecal valve, the scope was slowly and cautiously withdrawn.  The mucosal surfaces were carefully surveyed utilizing scope tip deflection to facilitate fold flattening as needed.  The scope was pulled down into the rectum where a thorough examination including retroflexion was performed.    FINDINGS:  Adequate preparation. (1) 2 x 3 cm carpet polyp just inside the anal verge at 5 cm; otherwise, the remainder of colonic mucosa appeared normal.  (1) 6 mm pedunculated polyp in the mid sigmoid segment; (1) 6 mm polyp in the mid descending segment. There were (2) 6 mm polyps at the splenic flexure and (1) 6 mm polyp at the hepatic  flexure; the remainder of colonic mucosa appeared normal.  THERAPEUTIC / DIAGNOSTIC MANEUVERS PERFORMED:  The above-mentioned colon poyps were removed with hot snare cautery. Multiple specimens to the pathologist. The rectal polyp was removed in piecemeal fashion and felt to have been completely resected.  COMPLICATIONS: None  CECAL WITHDRAWAL TIME:  31 minutes  IMPRESSION:  Multiple colonic and rectal polyps status post resection as described above  RECOMMENDATIONS:  This lady's polypectomy sites, particularly the rectal lesion, are at high risk for rebleeding with the reinstitution of anticoagulation therapy. The risk and benefits and timing of reinstitution of anticoagulation reviewed at some length with husband and patient at the bedside. Regardless of anticoagulation, there is no way to prevent a post polypectomy bleed 100% of the time.  Our plan today is to have the patient resume Coumadin tomorrow. However, we will hold resumption of bridging therapy until April 12. She is to continue bridging therapy until her INR is therapeutic  Followup on pathology. At a minimum, the patient will need an early interval followup colonoscopy (ie. 3-6 months) to reassess the rectal polyp site.  _______________________________ eSigned:  R. Roetta Sessions, MD FACP [facility] 01/06/2013 2:40 PM   CC:    PATIENT NAME:  Makela, Niehoff MR#: #[id number]

## 2013-01-06 NOTE — Interval H&P Note (Signed)
History and Physical Interval Note:  01/06/2013 1:14 PM  Hailey Curry  has presented today for surgery, with the diagnosis of Colonic adenoma  The various methods of treatment have been discussed with the patient and family. After consideration of risks, benefits and other options for treatment, the patient has consented to  Procedure(s) with comments: COLONOSCOPY (N/A) - 12:45-moved to 1300 [address] Ann notified pt as a surgical intervention .  The patient's history has been reviewed, patient examined, no change in status, stable for surgery.  I have reviewed the patient's chart and labs.  Questions were answered to the patient's satisfaction.     Eula Listen  Colonoscopy per plan.The risks, benefits, limitations, alternatives and imponderables have been reviewed with the patient. Questions have been answered. All parties are agreeable.

## 2013-01-06 NOTE — H&P (View-Only) (Signed)
Primary Care Physician:  GOLDING, JOHN CABOT, MD Primary Gastroenterologist:  Dr. Janely Gullickson  Pre-Procedure History & Physical: HPI:  Hailey Curry is a 67 y.o. female here for consideration of surveillance colonoscopy. She had multiple adenomas and one tubulovillous adenoma removed from her rectum in 2010. She is due for surveillance now. She has a mechanical aortic valve and is chronically anticoagulated. She does have a tendency towards postprandial diarrhea. She is a diabetic and takes metformin.  Past Medical History  Diagnosis Date  . Diabetes mellitus   . Hypertension   . High cholesterol   . Breast cancer   . Colonic adenoma     Past Surgical History  Procedure Laterality Date  . Coronary artery bypass graft    . Breast surgery    . Aortic valve replacement    . Colonoscopy  03/04/2003    Dr. Gevork Ayyad- internal hemorrhoids o/w normal rectum, scattered L sided diveritula  . Colonoscopy  04/12/2009    Dr. Avrie Kedzierski-rectal polyp, L dided diverticula, tubular adenoma on bx    Prior to Admission medications   Medication Sig Start Date End Date Taking? Authorizing Provider  Carboxymethylcellulose Sodium (THERATEARS) 0.25 % SOLN Apply 1-2 drops to eye daily as needed.   Yes Historical Provider, MD  cyanocobalamin (,VITAMIN B-12,) 1000 MCG/ML injection Inject 1,000 mcg into the muscle every 30 (thirty) days.   Yes Historical Provider, MD  escitalopram (LEXAPRO) 20 MG tablet Take 20 mg by mouth daily.  12/09/12  Yes Historical Provider, MD  gabapentin (NEURONTIN) 300 MG capsule Take 300 mg by mouth daily as needed.   Yes Historical Provider, MD  Liraglutide (VICTOZA) 18 MG/3ML SOLN Inject 1.8 mg into the skin every morning.    Yes Historical Provider, MD  loratadine (CLARITIN) 10 MG tablet Take 10 mg by mouth every morning.   Yes Historical Provider, MD  metFORMIN (GLUCOPHAGE-XR) 500 MG 24 hr tablet Take 500 mg by mouth 2 (two) times daily.    Yes Historical Provider, MD  mometasone (NASONEX) 50  MCG/ACT nasal spray Place 2 sprays into the nose daily as needed.   Yes Historical Provider, MD  olmesartan-hydrochlorothiazide (BENICAR HCT) 20-12.5 MG per tablet Take 1 tablet by mouth every morning.   Yes Historical Provider, MD  Vitamin D, Ergocalciferol, (DRISDOL) 50000 UNITS CAPS Take 50,000 Units by mouth every 7 (seven) days. On Wednesday EVENING   Yes Historical Provider, MD  warfarin (COUMADIN) 5 MG tablet Take 5-7.5 mg by mouth as directed. Take (one &one-half tablet) 7.5 mg on Monday, Wednesday, Friday. Take (one tablet 5 mg on Tuesday, Thursday, Saturday and Sunday)   Yes Historical Provider, MD  citalopram (CELEXA) 40 MG tablet Take 40 mg by mouth every evening.    Historical Provider, MD    Allergies as of 12/23/2012 - Review Complete 12/23/2012  Allergen Reaction Noted  . Demerol Other (See Comments) 07/19/2011    No family history on file.  History   Social History  . Marital Status: Married    Spouse Name: N/A    Number of Children: N/A  . Years of Education: N/A   Occupational History  . Not on file.   Social History Main Topics  . Smoking status: Never Smoker   . Smokeless tobacco: Not on file  . Alcohol Use: No  . Drug Use: No  . Sexually Active:    Other Topics Concern  . Not on file   Social History Narrative  . No narrative on file      Review of Systems: See HPI, otherwise negative ROS  Physical Exam: BP 152/68  Pulse 82  Temp(Src) 97.4 F (36.3 C) (Oral)  Ht 5' 3.5" (1.613 m)  Wt 258 lb (117.028 kg)  BMI 44.98 kg/m2 General:   Alert,  Well-developed, well-nourished, pleasant and cooperative in NAD Skin:  Intact without significant lesions or rashes. Eyes:  Sclera clear, no icterus.   Conjunctiva pink. Ears:  Normal auditory acuity. Nose:  No deformity, discharge,  or lesions. Mouth:  No deformity or lesions. Neck:  Supple; no masses or thyromegaly. No significant cervical adenopathy. Lungs:  Clear throughout to auscultation.   No  wheezes, crackles, or rhonchi. No acute distress. Heart:  Regular rate and rhythm; no murmurs, clicks, rubs,  or gallops. Abdomen: Non-distended, normal bowel sounds.  Soft and nontender without appreciable mass or hepatosplenomegaly.  Pulses:  Normal pulses noted. Extremities:  Without clubbing or edema.  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 She has a tendency towards diarrhea these days. She is now anticoagulated because of a mechanical aortic valve. She's followed by Dr. Berry for this issue.  Altered bowel function may be simply curable bowel syndrome. Other considerations could be medication effect, diabetic enteropathy or even microscopic colitis.  Recommendations: I will offer this nice lady a surveillance colonoscopy in the near future.The risks, benefits, limitations, alternatives and imponderables have been reviewed with the patient. Questions have been answered. All parties are agreeable.  As far as management of her anticoagulation is concerned, I suspect she will need bridging with Lovenox. We will make contact with Dr. Berry's office to get their recommendations for this upcoming procedure. 

## 2013-01-10 ENCOUNTER — Encounter: Payer: Self-pay | Admitting: Internal Medicine

## 2013-01-11 ENCOUNTER — Encounter (HOSPITAL_COMMUNITY): Payer: Self-pay | Admitting: Internal Medicine

## 2013-01-12 ENCOUNTER — Telehealth: Payer: Self-pay

## 2013-01-12 ENCOUNTER — Encounter: Payer: Self-pay | Admitting: Gastroenterology

## 2013-01-12 ENCOUNTER — Other Ambulatory Visit: Payer: Self-pay

## 2013-01-12 ENCOUNTER — Other Ambulatory Visit: Payer: Self-pay | Admitting: Gastroenterology

## 2013-01-12 DIAGNOSIS — K625 Hemorrhage of anus and rectum: Secondary | ICD-10-CM

## 2013-01-12 DIAGNOSIS — Z7901 Long term (current) use of anticoagulants: Secondary | ICD-10-CM

## 2013-01-12 LAB — CBC WITH DIFFERENTIAL/PLATELET
Basophils Absolute: 0 10*3/uL (ref 0.0–0.1)
Basophils Relative: 0 % (ref 0–1)
Eosinophils Relative: 2 % (ref 0–5)
HCT: 35 % — ABNORMAL LOW (ref 36.0–46.0)
Hemoglobin: 12.1 g/dL (ref 12.0–15.0)
MCH: 28.7 pg (ref 26.0–34.0)
MCHC: 34.6 g/dL (ref 30.0–36.0)
MCV: 83.1 fL (ref 78.0–100.0)
Monocytes Absolute: 0.6 10*3/uL (ref 0.1–1.0)
Monocytes Relative: 10 % (ref 3–12)
Neutro Abs: 3.3 10*3/uL (ref 1.7–7.7)
RDW: 14.6 % (ref 11.5–15.5)

## 2013-01-12 MED ORDER — ENOXAPARIN SODIUM 100 MG/ML ~~LOC~~ SOLN: 100.0000 mg | Freq: Two times a day (BID) | SUBCUTANEOUS | Status: DC

## 2013-01-12 NOTE — Telephone Encounter (Signed)
Pt is aware.  

## 2013-01-12 NOTE — Addendum Note (Signed)
Addended by: [place] on: 01/12/2013 03:31 PM   Modules accepted: Orders

## 2013-01-12 NOTE — Telephone Encounter (Signed)
Patient will pick up lab order tomorrow.

## 2013-01-12 NOTE — Telephone Encounter (Signed)
Pt came by office this afternoon. She stated she was having rectal bleeding. It started this am with just some pink water after her bm. She then had another bm and noticed a lot of thick, dark blood with wiping. She is having some mild cramping, itching in her rectum and very tired. They increased her coumadin yesterday and she is still taking the lovenox injections, (last one is tomorrow) she reports her INR was 1.6 yesterday. Spoke with AS- she ordered a stat cbc and if pt experiences a large amount of rectal bleeding she needs to proceed to the ED immediately. I informed pt and she said she would have the cbc done now and she understands to go to the ED if it gets worse.

## 2013-01-12 NOTE — Telephone Encounter (Signed)
PLEASE CALL PT. SHE SHOULD HOLD LOVENOX DOSE TONIGHT. SHE SHOULD KEEP HER COUMADIN DOSE AT 5 MG DAILY FOR 3 DAYS. CHECK STAT PT/INR APR 17. RESTART LOVENOX IN AM APR 16 AT 100 MG SQ BID & CONTINUE UNTIL APR 18.

## 2013-01-12 NOTE — Telephone Encounter (Signed)
Spoke with patient and explained in detail.  She knows to hold the lovenox dose tonight.  She is coming by the office to pick up a detailed letter with the further instructions. Remain at Coumadin 5 mg. Have stat INR done on April 17th.

## 2013-01-12 NOTE — Addendum Note (Signed)
Addended by: Nira Retort on: 01/12/2013 04:07 PM   Modules accepted: Orders

## 2013-01-12 NOTE — Telephone Encounter (Signed)
Agree. Patient is at high risk for post-polypectomy bleed. Please make sure patient knows that if anything changes whatsoever, she needs to seek medical attention right away.

## 2013-01-14 ENCOUNTER — Telehealth: Payer: Self-pay | Admitting: Internal Medicine

## 2013-01-14 LAB — PROTIME-INR: INR: 1.89 — ABNORMAL HIGH (ref <1.50)

## 2013-01-14 NOTE — Telephone Encounter (Signed)
I'm sorry, I thought you meant the path report. INR is 1.89. Almost therapeutic range. If she is not bleeding, please have her call Dr. Hazle Coca office ASAP, so she may have instructions from here on out.   For now, continue Coumadin 5 mg today and tomorrow.  Continue Lovenox dosing tonight and BID tomorrow.  They may want to adjust Coumadin and/or check early next week. I believe she was going to see or talk to Dr. Hazle Coca office today or tomorrow.

## 2013-01-14 NOTE — Telephone Encounter (Signed)
Pts husband is aware, he stated the bleeding has almost completely stopped. They are going to continue our instructions with coumadin and lovenox until she see's Dr. Allyson Sabal and has them handle coumadin instructions.

## 2013-01-14 NOTE — Telephone Encounter (Signed)
Pt's husband called to give [place] their cell phone number to call if the results come in. [phone]) They were leaving the lab as he was calling and I told him that the office will be closed tomorrow that it may be Monday before we got the results back and the patient in the back ground LOUDLY said that she was told we would have the results this afternoon. I explained that once results are faxed to [place] that RMR will need to sign off before nurse calls unless it was something urgent. Please call patient once results are ready.

## 2013-01-14 NOTE — Telephone Encounter (Signed)
Routing to AS for review. 

## 2013-01-14 NOTE — Telephone Encounter (Signed)
It appears that her polyps were adenomatous.  Dr. Jena Gauss has composed a letter and recommended a colonoscopy again in 6 months.  Please have patient return in Sept 2014 to set this up

## 2013-01-18 ENCOUNTER — Encounter (HOSPITAL_COMMUNITY)
Admission: EM | Disposition: A | Discharge status: Home / Self Care | Payer: Self-pay | Source: Home / Self Care | Attending: Internal Medicine

## 2013-01-18 ENCOUNTER — Inpatient Hospital Stay (HOSPITAL_COMMUNITY)
Admission: EM | Admit: 2013-01-18 | Discharge: 2013-01-20 | DRG: 920 | Disposition: A | Discharge status: Home / Self Care | Payer: Medicare Other | Attending: Internal Medicine | Admitting: Internal Medicine

## 2013-01-18 ENCOUNTER — Encounter (HOSPITAL_COMMUNITY): Payer: Self-pay | Admitting: *Deleted

## 2013-01-18 DIAGNOSIS — Z86010 Personal history of colonic polyps: Secondary | ICD-10-CM

## 2013-01-18 DIAGNOSIS — T45515A Adverse effect of anticoagulants, initial encounter: Secondary | ICD-10-CM | POA: Diagnosis present

## 2013-01-18 DIAGNOSIS — E875 Hyperkalemia: Secondary | ICD-10-CM | POA: Diagnosis present

## 2013-01-18 DIAGNOSIS — N179 Acute kidney failure, unspecified: Secondary | ICD-10-CM | POA: Diagnosis present

## 2013-01-18 DIAGNOSIS — E7800 Pure hypercholesterolemia, unspecified: Secondary | ICD-10-CM | POA: Diagnosis present

## 2013-01-18 DIAGNOSIS — K625 Hemorrhage of anus and rectum: Secondary | ICD-10-CM

## 2013-01-18 DIAGNOSIS — Z853 Personal history of malignant neoplasm of breast: Secondary | ICD-10-CM

## 2013-01-18 DIAGNOSIS — I10 Essential (primary) hypertension: Secondary | ICD-10-CM | POA: Diagnosis present

## 2013-01-18 DIAGNOSIS — Z79899 Other long term (current) drug therapy: Secondary | ICD-10-CM

## 2013-01-18 DIAGNOSIS — K921 Melena: Secondary | ICD-10-CM

## 2013-01-18 DIAGNOSIS — IMO0001 Reserved for inherently not codable concepts without codable children: Secondary | ICD-10-CM | POA: Diagnosis present

## 2013-01-18 DIAGNOSIS — IMO0002 Reserved for concepts with insufficient information to code with codable children: Secondary | ICD-10-CM

## 2013-01-18 DIAGNOSIS — E669 Obesity, unspecified: Secondary | ICD-10-CM | POA: Diagnosis present

## 2013-01-18 DIAGNOSIS — T889XXS Complication of surgical and medical care, unspecified, sequela: Secondary | ICD-10-CM

## 2013-01-18 DIAGNOSIS — Z6841 Body Mass Index (BMI) 40.0 and over, adult: Secondary | ICD-10-CM

## 2013-01-18 DIAGNOSIS — K922 Gastrointestinal hemorrhage, unspecified: Secondary | ICD-10-CM | POA: Diagnosis present

## 2013-01-18 DIAGNOSIS — D62 Acute posthemorrhagic anemia: Secondary | ICD-10-CM

## 2013-01-18 DIAGNOSIS — Z951 Presence of aortocoronary bypass graft: Secondary | ICD-10-CM

## 2013-01-18 DIAGNOSIS — Y838 Other surgical procedures as the cause of abnormal reaction of the patient, or of later complication, without mention of misadventure at the time of the procedure: Secondary | ICD-10-CM

## 2013-01-18 DIAGNOSIS — Z7901 Long term (current) use of anticoagulants: Secondary | ICD-10-CM

## 2013-01-18 DIAGNOSIS — Z952 Presence of prosthetic heart valve: Secondary | ICD-10-CM

## 2013-01-18 DIAGNOSIS — E119 Type 2 diabetes mellitus without complications: Secondary | ICD-10-CM

## 2013-01-18 DIAGNOSIS — D126 Benign neoplasm of colon, unspecified: Secondary | ICD-10-CM

## 2013-01-18 DIAGNOSIS — Z954 Presence of other heart-valve replacement: Secondary | ICD-10-CM

## 2013-01-18 HISTORY — PX: COLONOSCOPY: SHX5424

## 2013-01-18 LAB — BASIC METABOLIC PANEL
BUN: 26 mg/dL — ABNORMAL HIGH (ref 6–23)
CO2: 26 mEq/L (ref 19–32)
Calcium: 8.4 mg/dL (ref 8.4–10.5)
Chloride: 107 mEq/L (ref 96–112)
Creatinine, Ser: 1.31 mg/dL — ABNORMAL HIGH (ref 0.50–1.10)

## 2013-01-18 LAB — GLUCOSE, CAPILLARY
Glucose-Capillary: 279 mg/dL — ABNORMAL HIGH (ref 70–99)
Glucose-Capillary: 353 mg/dL — ABNORMAL HIGH (ref 70–99)

## 2013-01-18 LAB — CBC WITH DIFFERENTIAL/PLATELET
Basophils Relative: 0 % (ref 0–1)
Eosinophils Relative: 2 % (ref 0–5)
HCT: 26.9 % — ABNORMAL LOW (ref 36.0–46.0)
Hemoglobin: 9 g/dL — ABNORMAL LOW (ref 12.0–15.0)
Lymphocytes Relative: 23 % (ref 12–46)
MCHC: 33.5 g/dL (ref 30.0–36.0)
MCV: 85.1 fL (ref 78.0–100.0)
Monocytes Absolute: 0.4 10*3/uL (ref 0.1–1.0)
Monocytes Relative: 8 % (ref 3–12)
Neutro Abs: 3.1 10*3/uL (ref 1.7–7.7)
RDW: 14.8 % (ref 11.5–15.5)

## 2013-01-18 LAB — HEMOGLOBIN AND HEMATOCRIT, BLOOD: HCT: 26 % — ABNORMAL LOW (ref 36.0–46.0)

## 2013-01-18 SURGERY — COLONOSCOPY
Anesthesia: Moderate Sedation

## 2013-01-18 MED ORDER — ONDANSETRON HCL 4 MG/2ML IJ SOLN
INTRAMUSCULAR | Status: AC
  Filled 2013-01-18: qty 2

## 2013-01-18 MED ORDER — WARFARIN - PHYSICIAN DOSING INPATIENT
Freq: Every day | Status: DC
  Administered 2013-01-18: 18:00:00

## 2013-01-18 MED ORDER — INSULIN ASPART 100 UNIT/ML ~~LOC~~ SOLN
0.0000 [IU] | Freq: Three times a day (TID) | SUBCUTANEOUS | Status: DC
  Administered 2013-01-18: 9 [IU] via SUBCUTANEOUS
  Administered 2013-01-19: 7 [IU] via SUBCUTANEOUS
  Administered 2013-01-19: 3 [IU] via SUBCUTANEOUS
  Administered 2013-01-20: 5 [IU] via SUBCUTANEOUS

## 2013-01-18 MED ORDER — LIRAGLUTIDE 18 MG/3ML ~~LOC~~ SOLN
1.8000 mg | Freq: Every day | SUBCUTANEOUS | Status: DC
  Filled 2013-01-18 (×9): qty 0.3

## 2013-01-18 MED ORDER — PHYTONADIONE 5 MG PO TABS
2.5000 mg | ORAL_TABLET | Freq: Once | ORAL | Status: AC
  Administered 2013-01-18: 2.5 mg via ORAL
  Filled 2013-01-18: qty 1

## 2013-01-18 MED ORDER — MIDAZOLAM HCL 5 MG/5ML IJ SOLN
INTRAMUSCULAR | Status: AC
  Filled 2013-01-18: qty 10

## 2013-01-18 MED ORDER — MIDAZOLAM HCL 5 MG/5ML IJ SOLN
INTRAMUSCULAR | Status: DC | PRN
  Administered 2013-01-18: 1 mg via INTRAVENOUS
  Administered 2013-01-18 (×2): 2 mg via INTRAVENOUS

## 2013-01-18 MED ORDER — GLIMEPIRIDE 1 MG PO TABS
0.5000 mg | ORAL_TABLET | Freq: Every day | ORAL | Status: DC
  Filled 2013-01-18 (×2): qty 0.5

## 2013-01-18 MED ORDER — LORATADINE 10 MG PO TABS
10.0000 mg | ORAL_TABLET | ORAL | Status: DC
  Administered 2013-01-19 – 2013-01-20 (×2): 10 mg via ORAL
  Filled 2013-01-18 (×2): qty 1

## 2013-01-18 MED ORDER — STERILE WATER FOR IRRIGATION IR SOLN
Status: DC | PRN
  Administered 2013-01-18: 13:00:00

## 2013-01-18 MED ORDER — FLUTICASONE PROPIONATE 50 MCG/ACT NA SUSP
1.0000 | Freq: Every day | NASAL | Status: DC
  Administered 2013-01-18 – 2013-01-20 (×3): 1 via NASAL
  Filled 2013-01-18: qty 16

## 2013-01-18 MED ORDER — ONDANSETRON HCL 4 MG/2ML IJ SOLN
INTRAMUSCULAR | Status: DC | PRN
  Administered 2013-01-18: 4 mg via INTRAVENOUS

## 2013-01-18 MED ORDER — FENTANYL CITRATE 0.05 MG/ML IJ SOLN
INTRAMUSCULAR | Status: DC | PRN
  Administered 2013-01-18: 25 ug via INTRAVENOUS
  Administered 2013-01-18: 50 ug via INTRAVENOUS

## 2013-01-18 MED ORDER — MEPERIDINE HCL 100 MG/ML IJ SOLN
INTRAMUSCULAR | Status: AC
  Filled 2013-01-18: qty 1

## 2013-01-18 MED ORDER — DOCUSATE SODIUM 100 MG PO CAPS
100.0000 mg | ORAL_CAPSULE | Freq: Two times a day (BID) | ORAL | Status: DC
  Administered 2013-01-18 – 2013-01-20 (×4): 100 mg via ORAL
  Filled 2013-01-18 (×4): qty 1

## 2013-01-18 MED ORDER — METFORMIN HCL ER 500 MG PO TB24
1000.0000 mg | ORAL_TABLET | Freq: Every day | ORAL | Status: DC
  Filled 2013-01-18 (×2): qty 2

## 2013-01-18 MED ORDER — SODIUM CHLORIDE 0.9 % IJ SOLN
INTRAMUSCULAR | Status: DC | PRN
  Administered 2013-01-18 (×2)

## 2013-01-18 MED ORDER — ALUM & MAG HYDROXIDE-SIMETH 200-200-20 MG/5ML PO SUSP: 30.0000 mL | Freq: Four times a day (QID) | ORAL | Status: DC | PRN

## 2013-01-18 MED ORDER — HYDROCODONE-ACETAMINOPHEN 5-325 MG PO TABS: 1.0000 | ORAL_TABLET | ORAL | Status: DC | PRN

## 2013-01-18 MED ORDER — FENTANYL CITRATE 0.05 MG/ML IJ SOLN
INTRAMUSCULAR | Status: AC
  Filled 2013-01-18: qty 2

## 2013-01-18 MED ORDER — SODIUM CHLORIDE 0.9 % IV SOLN
INTRAVENOUS | Status: DC
  Administered 2013-01-18: 11:00:00 via INTRAVENOUS

## 2013-01-18 MED ORDER — ACETAMINOPHEN 500 MG PO TABS
500.0000 mg | ORAL_TABLET | Freq: Four times a day (QID) | ORAL | Status: DC | PRN
Start: 2013-01-18 — End: 2013-01-20
  Administered 2013-01-18 – 2013-01-19 (×3): 500 mg via ORAL
  Filled 2013-01-18 (×3): qty 1

## 2013-01-18 MED ORDER — ESCITALOPRAM OXALATE 10 MG PO TABS
20.0000 mg | ORAL_TABLET | Freq: Every day | ORAL | Status: DC
  Administered 2013-01-18 – 2013-01-19 (×2): 20 mg via ORAL
  Filled 2013-01-18 (×3): qty 2

## 2013-01-18 MED ORDER — SODIUM CHLORIDE 0.9 % IV SOLN
INTRAVENOUS | Status: DC
  Administered 2013-01-18 (×2): via INTRAVENOUS

## 2013-01-18 MED ORDER — SODIUM CHLORIDE 0.9 % IJ SOLN: 3.0000 mL | Freq: Two times a day (BID) | INTRAMUSCULAR | Status: DC

## 2013-01-18 MED ORDER — WARFARIN SODIUM 7.5 MG PO TABS
7.5000 mg | ORAL_TABLET | Freq: Every day | ORAL | Status: DC
  Administered 2013-01-18 – 2013-01-19 (×2): 7.5 mg via ORAL
  Filled 2013-01-18 (×2): qty 1

## 2013-01-18 NOTE — H&P (Signed)
[facility] History and Physical  Hailey Curry ZOX:[id number] DOB: 1946/08/25 DOA: 01/18/2013  Referring physician:  PCP: Colette Ribas, MD  Specialists:   Chief Complaint: BRBPR  HPI: Hailey Curry is a very pleasant 67 y.o. female with past medical hx that includes HTN, DM, AS s/p valve replacement and CABG, breast cancer and colonic adenoma and most recently colonoscopy and polypectomy on 01/06/13 with multiple colonic and rectal polyps noted who presents to ED with BRBPR. Information obtained from patient and husband who is at bedside. Pt sates her coumadin was restarted on 4/10 with lovenox bridge started 4/12. She had mild bleeding initially that resolved and she was instructed by GI to keep coumadin at 5mg . She states 4 days ago she spoke to Dr. Hazle Coca office and coumadin was increased to 7.5mg . She states shortly thereafter the BRBPR resumed. She denies abdominal pain/nausea/vomiting. Associated symptoms include lightheadedness, shortness of breath with exertion. Denies headache, cp, palpitations. Symptoms came on suddenly are intermittent, characterized as persistent and moderate. In ED she was noted to have Hg 9 which is 3 gm drop from 4 days ago, INR 2.43, HR 89 BP 120/50 and creatinine 1.3 and glucose 336. In ED she was seen by GI who plan to scope this afternoon after tap water enema and dose of Vit K. TRH asked to admit.     Review of Systems: The patient denies anorexia, fever, weight loss,, vision loss, decreased hearing, hoarseness, chest pain, syncope,  peripheral edema, balance deficits, hemoptysis, abdominal pain, severe indigestion/heartburn, hematuria, incontinence, genital sores, muscle weakness, suspicious skin lesions, transient blindness, difficulty walking, depression, unusual weight change, enlarged lymph nodes, angioedema, and breast masses.    Past Medical History  Diagnosis Date  . Diabetes mellitus   . Hypertension   . High cholesterol   . Breast  cancer   . Colonic adenoma    Past Surgical History  Procedure Laterality Date  . Coronary artery bypass graft    . Breast surgery    . Aortic valve replacement    . Colonoscopy  03/04/2003    Dr. Jena Gauss- internal hemorrhoids o/w normal rectum, scattered L sided diveritula  . Colonoscopy  04/12/2009    Dr. Frankey Poot polyp, L dided diverticula, tubular adenoma on bx  . Colonoscopy N/A 01/06/2013    RMR; multiple colonic/rectal polyps s/p polypectomy. Fragments of tubular adenomas, needs surveillance Oct 2014   Social History:  reports that she has never smoked. She does not have any smokeless tobacco history on file. She reports that she does not drink alcohol or use illicit drugs. Pt married 46 years and lives at home with husband. Is retired [employer] employee with [employer].  Allergies  Allergen Reactions  . Demerol Other (See Comments)    Blood pressure goes up    No family history on file. Mother deceased at 87 from HF. Father deceased 40 after hip surgery after fall. 1 brother with HTN.   Prior to Admission medications   Medication Sig Start Date End Date Taking? Authorizing Provider  acetaminophen (TYLENOL) 500 MG tablet Take 500 mg by mouth every 6 (six) hours as needed for pain.   Yes Historical Provider, MD  cyanocobalamin (,VITAMIN B-12,) 1000 MCG/ML injection Inject 1,000 mcg into the muscle every 30 (thirty) days.   Yes Historical Provider, MD  escitalopram (LEXAPRO) 20 MG tablet Take 20 mg by mouth daily.  12/09/12  Yes Historical Provider, MD  Liraglutide (VICTOZA) 18 MG/3ML SOLN Inject 1.8 mg into the skin  every morning.    Yes Historical Provider, MD  loratadine (CLARITIN) 10 MG tablet Take 10 mg by mouth every morning.   Yes Historical Provider, MD  metFORMIN (GLUCOPHAGE-XR) 500 MG 24 hr tablet Take 500 mg by mouth 2 (two) times daily.    Yes Historical Provider, MD  mometasone (NASONEX) 50 MCG/ACT nasal spray Place 2 sprays into the nose daily as needed  (Allergies).    Yes Historical Provider, MD  olmesartan-hydrochlorothiazide (BENICAR HCT) 20-12.5 MG per tablet Take 1 tablet by mouth every morning.   Yes Historical Provider, MD  warfarin (COUMADIN) 5 MG tablet Take 7.5 mg by mouth as directed.    Yes Historical Provider, MD  enoxaparin (LOVENOX) 100 MG/ML injection Inject 1 mL (100 mg total) into the skin every 12 (twelve) hours. 01/12/13   West Bali, MD   Physical Exam: Filed Vitals:   01/18/13 0800 01/18/13 0843 01/18/13 1014  BP: 134/60 177/66 120/53  Pulse: 81 89 86  Temp:  98.5 F (36.9 C) 98.8 F (37.1 C)  TempSrc:  Oral Oral  Resp: 14 19 16   SpO2: 96% 100% 98%     General:  Obese smiling, NAD  Eyes: PERRL EOMI no scleral icterus  ENT: ears clear no nasal drainage, oropharynx without erythema/exudate. Somewhat pale and dry  Neck: supple no JVD full ROM  Cardiovascular: RRR No murmur trace -1+ LE edema  Respiratory: normal effort. Somewhat distant bases but clear. No wheeze or rales  Abdomen: obese soft +BS non-tender to palpation no mass organomegaly   Skin: warm dry slightly pale no rash/lesion  Musculoskeletal: no clubbing/cyanosis MAE  Psychiatric: pleasant cooperative appropriate  Neurologic: cranial nerve II-XII intact. Speech clear facial symmetry  Labs on Admission:  Basic Metabolic Panel:  Recent Labs Lab 01/18/13 0730  NA 141  K 4.4  CL 107  CO2 26  GLUCOSE 336*  BUN 26*  CREATININE 1.31*  CALCIUM 8.4   Liver Function Tests: No results found for this basename: AST, ALT, ALKPHOS, BILITOT, PROT, ALBUMIN,  in the last 168 hours No results found for this basename: LIPASE, AMYLASE,  in the last 168 hours No results found for this basename: AMMONIA,  in the last 168 hours CBC:  Recent Labs Lab 01/12/13 1350 01/18/13 0730  WBC 5.5 4.7  NEUTROABS 3.3 3.1  HGB 12.1 9.0*  HCT 35.0* 26.9*  MCV 83.1 85.1  PLT 152 112*   Cardiac Enzymes: No results found for this basename: CKTOTAL,  CKMB, CKMBINDEX, TROPONINI,  in the last 168 hours  BNP (last 3 results) No results found for this basename: PROBNP,  in the last 8760 hours CBG: No results found for this basename: GLUCAP,  in the last 168 hours  Radiological Exams on Admission: No results found.  EKG: Independently reviewed.   Assessment/Plan Principal Problem:   Acute blood loss anemia: due to GI bleed s/p polypectomy and on coumadin/lovenox. Hg currently 9.0 from 12.0 4 days ago. Will admit to tele. Has been typed and screened. Will obtain serial H&H. Will transfuse in HG <8.5. Continue IV fluids. Currently VSS  Active Problems: GI bleed: s/p polypectomy 01/06/13 with resumption of coumadin 4/10 and increase in dose 4/17. Appreciate GI assistance. Vit. K per ED MD. Hold home coumadin and lovenox.  Pt NPO for likely colonoscopy this afternoon. Support with IV fluids and close monitoring. See #1.     Acute renal failure: likely related to #2 and decreased po intake. Will hold any nephrotoxins and gently hydrate  with IV fluids .    DM (diabetes mellitus): uncontrolled. Current CBG 336. Pt states she checks her CBG maybe twice weekly. Unsure of last A1C. On po medications. Will hold given NPO status. Will use SSI for glycemic control. Will check A1c.     S/P AVR (aortic valve replacement): dr. Hazle Coca office manages coumadin. Holding for now.     Hypertension: fair control. Will hold anti-hypertensive meds for now. Monitor closely    Colonic adenoma: close surveillance per GI note     GI  Code Status: full Family Communication: husband at bedside Disposition Plan: home when ready  Time spent: 61 minutese  [facility] M [facility]   If 7PM-7AM, please contact night-coverage [email or web address] Password [facility] 01/18/2013, 10:25 AM  Attending note:  Patient interviewed and examined. Bleeding has slowed, but still feels quite weak and dizzy. Coumadin ordered for tonight without Lovenox per GI. Endoscopy showed  arterial bleed at prior polypectomy site, status post intervention. Monitor hemoglobin. May require transfusion.  Crista Curb, M.D.

## 2013-01-18 NOTE — Telephone Encounter (Signed)
Reminder in epic °

## 2013-01-18 NOTE — Consult Note (Signed)
Referring Provider: No ref. provider found Primary Care Physician:  Colette Ribas, MD Primary Gastroenterologist:  Dr. Jena Gauss    Date of Consultation: 01/18/13  Reason for Consultation:  Rectal bleeding s/p recent polypectomy  HPI:  Hailey Curry is a 67 year old female with recent colonoscopy and polypectomy on January 06, 2013. Multiple colonic and rectal polyps noted, s/p resection. Path with fragments of tubular adenomas. She has a history of aortic valve replacement, requiring Coumadin. She was restarted on Coumadin April 10th, with Lovenox bridging started April 12th. She called into our office April 15th noting mild rectal bleeding. Stat CBC noted normal Hgb. Dr. Darrick Penna aware, instructions to hold Lovenox that evening and resume the following day, keeping Coumadin at 5 mg. Bleeding has essentially resolved 4/17. She was informed to seek medical attention if any further bleeding.  Presented today with 3 gram drop in Hgb to 9, INR 2.43. Contacted Dr. Hazle Coca office on Thursday and was instructed to remain on Lovenox, increase Coumadin to 7.5 mg. Last dose of Coumadin yesterday evening, last dose of Lovenox on Saturday. 3 bloody stools overnight, one since presentation to ED. No abdominal pain or other concerns. States bleeding had tapered off but resumed again over the weekend.   Past Medical History  Diagnosis Date  . Diabetes mellitus   . Hypertension   . High cholesterol   . Breast cancer   . Colonic adenoma     Past Surgical History  Procedure Laterality Date  . Coronary artery bypass graft    . Breast surgery    . Aortic valve replacement    . Colonoscopy  03/04/2003    Dr. Jena Gauss- internal hemorrhoids o/w normal rectum, scattered L sided diveritula  . Colonoscopy  04/12/2009    Dr. Frankey Poot polyp, L dided diverticula, tubular adenoma on bx  . Colonoscopy N/A 01/06/2013    RMR; multiple colonic/rectal polyps s/p polypectomy. Fragments of tubular adenomas, needs surveillance  Oct 2014    Prior to Admission medications   Medication Sig Start Date End Date Taking? Authorizing Provider  acetaminophen (TYLENOL) 500 MG tablet Take 500 mg by mouth every 6 (six) hours as needed for pain.   Yes Historical Provider, MD  cyanocobalamin (,VITAMIN B-12,) 1000 MCG/ML injection Inject 1,000 mcg into the muscle every 30 (thirty) days.   Yes Historical Provider, MD  escitalopram (LEXAPRO) 20 MG tablet Take 20 mg by mouth daily.  12/09/12  Yes Historical Provider, MD  Liraglutide (VICTOZA) 18 MG/3ML SOLN Inject 1.8 mg into the skin every morning.    Yes Historical Provider, MD  loratadine (CLARITIN) 10 MG tablet Take 10 mg by mouth every morning.   Yes Historical Provider, MD  metFORMIN (GLUCOPHAGE-XR) 500 MG 24 hr tablet Take 500 mg by mouth 2 (two) times daily.    Yes Historical Provider, MD  mometasone (NASONEX) 50 MCG/ACT nasal spray Place 2 sprays into the nose daily as needed (Allergies).    Yes Historical Provider, MD  olmesartan-hydrochlorothiazide (BENICAR HCT) 20-12.5 MG per tablet Take 1 tablet by mouth every morning.   Yes Historical Provider, MD  warfarin (COUMADIN) 5 MG tablet Take 7.5 mg by mouth as directed.    Yes Historical Provider, MD  enoxaparin (LOVENOX) 100 MG/ML injection Inject 1 mL (100 mg total) into the skin every 12 (twelve) hours. 01/12/13   [place] Bali, MD    Current Facility-Administered Medications  Medication Dose Route Frequency Provider Last Rate Last Dose  . phytonadione (VITAMIN K) tablet 2.5 mg  2.5  mg Oral Once Nelia Shi, MD       Current Outpatient Prescriptions  Medication Sig Dispense Refill  . acetaminophen (TYLENOL) 500 MG tablet Take 500 mg by mouth every 6 (six) hours as needed for pain.      . cyanocobalamin (,VITAMIN B-12,) 1000 MCG/ML injection Inject 1,000 mcg into the muscle every 30 (thirty) days.      Marland Kitchen escitalopram (LEXAPRO) 20 MG tablet Take 20 mg by mouth daily.       . Liraglutide (VICTOZA) 18 MG/3ML SOLN Inject  1.8 mg into the skin every morning.       . loratadine (CLARITIN) 10 MG tablet Take 10 mg by mouth every morning.      . metFORMIN (GLUCOPHAGE-XR) 500 MG 24 hr tablet Take 500 mg by mouth 2 (two) times daily.       . mometasone (NASONEX) 50 MCG/ACT nasal spray Place 2 sprays into the nose daily as needed (Allergies).       . olmesartan-hydrochlorothiazide (BENICAR HCT) 20-12.5 MG per tablet Take 1 tablet by mouth every morning.      . warfarin (COUMADIN) 5 MG tablet Take 7.5 mg by mouth as directed.       . enoxaparin (LOVENOX) 100 MG/ML injection Inject 1 mL (100 mg total) into the skin every 12 (twelve) hours.  6 Syringe  0    Allergies as of 01/18/2013 - Review Complete 01/18/2013  Allergen Reaction Noted  . Demerol Other (See Comments) 07/19/2011      History   Social History  . Marital Status: Married    Spouse Name: N/A    Number of Children: N/A  . Years of Education: N/A   Occupational History  . Not on file.   Social History Main Topics  . Smoking status: Never Smoker   . Smokeless tobacco: Not on file  . Alcohol Use: No  . Drug Use: No  . Sexually Active: Not on file   Other Topics Concern  . Not on file   Social History Narrative  . No narrative on file    Review of Systems: Negative unless mentioned in HPI.  Physical Exam: Vital signs in last 24 hours: Temp:  [98.5 F (36.9 C)] 98.5 F (36.9 C) (04/21 0843) Pulse Rate:  [81-89] 89 (04/21 0843) Resp:  [14-19] 19 (04/21 0843) BP: (134-177)/(60-66) 177/66 mmHg (04/21 0843) SpO2:  [96 %-100 %] 100 % (04/21 0843)   General:   Alert,  Well-developed, well-nourished, pleasant and cooperative in NAD Head:  Normocephalic and atraumatic. Eyes:  Sclera clear, no icterus.   Conjunctiva pink. Ears:  Normal auditory acuity. Nose:  No deformity, discharge,  or lesions. Mouth:  No deformity or lesions, dentition normal. Lungs:  Clear throughout to auscultation.   No wheezes, crackles, or rhonchi. No acute  distress. Heart:  S1 S2 present Abdomen:  Soft, nontender and nondistended. No masses, hepatosplenomegaly or hernias noted. Normal bowel sounds, without guarding, and without rebound.   Rectal:  Deferred  Msk:  Symmetrical without gross deformities. Normal posture. Pulses:  Normal pulses noted. Extremities:  Without clubbing or edema. Neurologic:  Alert and  oriented x4;  grossly normal neurologically. Skin:  Intact without significant lesions or rashes. Psych:  Alert and cooperative. Normal mood and affect.  Intake/Output from previous day:   Intake/Output this shift:    Lab Results:  Recent Labs  01/18/13 0730  WBC 4.7  HGB 9.0*  HCT 26.9*  PLT 112*   BMET  Recent  Labs  01/18/13 0730  NA 141  K 4.4  CL 107  CO2 26  GLUCOSE 336*  BUN 26*  CREATININE 1.31*  CALCIUM 8.4   PT/INR  Recent Labs  01/18/13 0730  LABPROT 25.3*  INR 2.43*     Impression: 67 year old female with history of mechanical valve replacement requiring anticoagulation, s/p recent colonoscopy with polypectomy January 06, 2013. Stuttering rectal bleeding last week with near cessation following adjustment of medications, yet now presenting with worsening rectal bleeding after increase of Coumadin last week. Last dose of Lovenox on Saturday, last dose of Coumadin yesterday evening. Notable 3 gram drop in Hgb, INR 2.43. Post-polypectomy bleeding as likely culprit, and lower GI evaluation in the way of colonoscopy with clipping, etc for hemostasis is warranted. Modified prep to include only 2 tap water enemas, no oral bowel purge. Plan on procedure this afternoon. Husband at bedside, discussed need for lower GI evaluation in the form of a colonoscopy with modified prep. They stated understanding. As of note, Vit K po has been ordered; pt with poor veins.   Plan: Colonoscopy today with only 2 tap water enemas for prep Remain NPO Agree with Vit K Monitor H/H  Nira Retort, ANP-BC [facility]  Gastroenterology  Patient seen and examined in short stay.   Limited to a full colonoscopy now being performed with bleeding control therapy as appropriate.  The risks, benefits, limitations, alternatives and imponderables have been reviewed with the patient. Questions have been answered. All parties are agreeable.    LOS: 0 days    01/18/2013, 9:20 AM

## 2013-01-18 NOTE — ED Provider Notes (Signed)
History     CSN: [id number]  Arrival date & time 01/18/13  4098   First MD Initiated Contact with Patient 01/18/13 [phone]      Chief Complaint  Patient presents with  . Rectal Bleeding     HPI Colonoscopy procedure 01/06/13 - rectal bleeding x 2 days, increasing. Bright and dark red blood. Pt reports sob and weakness also. C/o mild abd pain.  Patient stated that she had gross blood leaking from her rectum even without bowel movements this morning.  Past Medical History  Diagnosis Date  . Diabetes mellitus   . Hypertension   . High cholesterol   . Breast cancer   . Colonic adenoma     Past Surgical History  Procedure Laterality Date  . Coronary artery bypass graft    . Breast surgery    . Aortic valve replacement    . Colonoscopy  03/04/2003    Dr. Jena Gauss- internal hemorrhoids o/w normal rectum, scattered L sided diveritula  . Colonoscopy  04/12/2009    Dr. Frankey Poot polyp, L dided diverticula, tubular adenoma on bx  . Colonoscopy N/A 01/06/2013    RMR; multiple colonic/rectal polyps s/p polypectomy. Fragments of tubular adenomas, needs surveillance Oct 2014    No family history on file.  History  Substance Use Topics  . Smoking status: Never Smoker   . Smokeless tobacco: Not on file  . Alcohol Use: No    OB History   Grav Para Term Preterm Abortions TAB SAB Ect Mult Living                  Review of Systems All other systems reviewed and are negative Allergies  Demerol  Home Medications   Current Outpatient Rx  Name  Route  Sig  Dispense  Refill  . acetaminophen (TYLENOL) 500 MG tablet   Oral   Take 500 mg by mouth every 6 (six) hours as needed for pain.         . cyanocobalamin (,VITAMIN B-12,) 1000 MCG/ML injection   Intramuscular   Inject 1,000 mcg into the muscle every 30 (thirty) days.         Marland Kitchen escitalopram (LEXAPRO) 20 MG tablet   Oral   Take 20 mg by mouth daily.          . Liraglutide (VICTOZA) 18 MG/3ML SOLN   Subcutaneous   Inject  1.8 mg into the skin every morning.          . loratadine (CLARITIN) 10 MG tablet   Oral   Take 10 mg by mouth every morning.         . metFORMIN (GLUCOPHAGE-XR) 500 MG 24 hr tablet   Oral   Take 500 mg by mouth 2 (two) times daily.          . mometasone (NASONEX) 50 MCG/ACT nasal spray   Nasal   Place 2 sprays into the nose daily as needed (Allergies).          . olmesartan-hydrochlorothiazide (BENICAR HCT) 20-12.5 MG per tablet   Oral   Take 1 tablet by mouth every morning.         . warfarin (COUMADIN) 5 MG tablet   Oral   Take 7.5 mg by mouth as directed.          . enoxaparin (LOVENOX) 100 MG/ML injection   Subcutaneous   Inject 1 mL (100 mg total) into the skin every 12 (twelve) hours.   6 Syringe   0  BP 177/66  Pulse 89  Temp(Src) 98.5 F (36.9 C) (Oral)  Resp 19  SpO2 100%  Physical Exam  Nursing note and vitals reviewed. Constitutional: She is oriented to person, place, and time. She appears well-developed and well-nourished. No distress.  HENT:  Head: Normocephalic and atraumatic.  Eyes: Pupils are equal, round, and reactive to light.  Neck: Normal range of motion.  Cardiovascular: Normal rate and intact distal pulses.   Pulmonary/Chest: No respiratory distress.  Abdominal: Normal appearance. She exhibits no distension.  Musculoskeletal: Normal range of motion.  Neurological: She is alert and oriented to person, place, and time. No cranial nerve deficit.  Skin: Skin is warm and dry. No rash noted.  Psychiatric: She has a normal mood and affect. Her behavior is normal.    ED Course  Procedures (including critical care time) Meds ordered this encounter  Medications  . phytonadione (VITAMIN K) tablet 2.5 mg    Sig:     Labs Reviewed  CBC WITH DIFFERENTIAL - Abnormal; Notable for the following:    RBC 3.16 (*)    Hemoglobin 9.0 (*)    HCT 26.9 (*)    Platelets 112 (*)    All other components within normal limits  BASIC  METABOLIC PANEL - Abnormal; Notable for the following:    Glucose, Bld 336 (*)    BUN 26 (*)    Creatinine, Ser 1.31 (*)    GFR calc non Af Amer 41 (*)    GFR calc Af Amer 47 (*)    All other components within normal limits  PROTIME-INR - Abnormal; Notable for the following:    Prothrombin Time 25.3 (*)    INR 2.43 (*)    All other components within normal limits  TYPE AND SCREEN   No results found.   1. Rectal bleeding   2. Anemia associated with acute blood loss       MDM  Anemia is new finding.  Discuss case with GI.  Plan we'll give vitamin K and admit because of dropping hemoglobin.  Have typed and screened prior to admission.        Nelia Shi, MD 01/18/13 [phone]

## 2013-01-18 NOTE — Op Note (Addendum)
[place] [address] [place], [address]   COLONOSCOPY PROCEDURE REPORT  PATIENT: Hailey Curry, Hailey Curry  MR#:         #[id number] BIRTHDATE: Aug 01, 1946 , 67  yrs. old GENDER: Female ENDOSCOPIST: R.  Roetta Sessions, MD FACP FACG REFERRED BY:  Assunta Found, M.D.  Runell Gess, M.D. PROCEDURE DATE:  01/18/2013 PROCEDURE:     Incomplete colonoscopy with bleeding controlled therapy  INDICATIONS: hematochezia with recent rectal and colonic polypectomies; on Coumadin and Lovenox  INFORMED CONSENT:  The risks, benefits, alternatives and imponderables including but not limited to bleeding, perforation as well as the possibility of a missed lesion have been reviewed.  The potential for biopsy, lesion removal, etc. have also been discussed.  Questions have been answered.  All parties agreeable. Please see the history and physical in the medical record for more information.  MEDICATIONS: fentanyl 75 mcg IV and Versed 5 mg IV. Zofran 4 mg IV  DESCRIPTION OF PROCEDURE:  After a digital rectal exam was performed, the EC-[id number] ([id number])  colonoscope was advanced from the anus through the rectum and colon to the area of the cecum, ileocecal valve and appendiceal orifice.  The cecum was deeply intubated.  These structures were well-seen and photographed for the record.  From the level of the cecum and ileocecal valve, the scope was slowly and cautiously withdrawn.  The mucosal surfaces were carefully surveyed utilizing scope tip deflection to facilitate fold flattening as needed.  The scope was pulled down into the rectum where a thorough examination including retroflexion was performed.    FINDINGS:  Large amount of blood and clot in rectum. Rectal polypectomy site identified with clot and arterial bleeding coming from the base. Clot and blood in the lumen of rectum and sigmoid but tapered off into the mid descending segment where quite a bit of formed stool was  encountered but no blood. Colonoscopy not carried out beyond the descending segment.  THERAPEUTIC / DIAGNOSTIC MANEUVERS PERFORMED:  A total of 5 cc of 1 in 10,000 epinephrine injected at the periphery of the rectal polypectomy site. Subsequently, a total of 5 resolution clips placed with excellent hemostasis being achieved.  COMPLICATIONS: none  CECAL WITHDRAWAL TIME:  not applicable  IMPRESSION:  Rectal polypectomy site with active bleeding  - status post bleeding controlled therapy  RECOMMENDATIONS: Clear liquid diet; follow H&H. Hopefully, resume Coumadin prior to discharge without a loading dose or bridging therapy. No MRI until clips past.  _______________________________ eSigned:  R. Roetta Sessions, MD FACP [facility] 01/18/2013 1:34 PM   CC:

## 2013-01-18 NOTE — ED Notes (Signed)
Colonoscopy procedure 01/06/13 - rectal bleeding x 2 days, increasing.  Bright and dark red blood.  Pt reports sob and weakness also. C/o mild abd pain.

## 2013-01-18 NOTE — OR Nursing (Signed)
Patient to short stay for colonoscopy. Tap water enema administered per MD order with light bloody return. Patient tolerated without difficulty.

## 2013-01-18 NOTE — ED Notes (Signed)
Endoscopy called and reported that pt was scheduled to have colonoscopy this afternoon and that Enema x 2 needed to be completed prior to. Endoscopy reported pt could be brought to short stay where Enema would be completed. Pt being transferred to short stay care and will notify short stay staff of bed assignment when it becomes available. Pt aware and verbalized understanding.nad noted.

## 2013-01-18 NOTE — Progress Notes (Signed)
Restarting Coumadin 7.5 mg this evening.  Discussed with Dr. Jena Gauss.

## 2013-01-19 DIAGNOSIS — I10 Essential (primary) hypertension: Secondary | ICD-10-CM

## 2013-01-19 LAB — PROTIME-INR
INR: 2.26 — ABNORMAL HIGH (ref 0.00–1.49)
Prothrombin Time: 24 seconds — ABNORMAL HIGH (ref 11.6–15.2)

## 2013-01-19 LAB — BASIC METABOLIC PANEL
BUN: 27 mg/dL — ABNORMAL HIGH (ref 6–23)
Chloride: 105 mEq/L (ref 96–112)
GFR calc Af Amer: 37 mL/min — ABNORMAL LOW (ref >90)
GFR calc non Af Amer: 32 mL/min — ABNORMAL LOW (ref >90)
Potassium: 5.6 mEq/L — ABNORMAL HIGH (ref 3.5–5.1)
Sodium: 137 mEq/L (ref 135–145)

## 2013-01-19 LAB — HEMOGLOBIN AND HEMATOCRIT, BLOOD
HCT: 23.6 % — ABNORMAL LOW (ref 36.0–46.0)
HCT: 24.1 % — ABNORMAL LOW (ref 36.0–46.0)
Hemoglobin: 7.9 g/dL — ABNORMAL LOW (ref 12.0–15.0)

## 2013-01-19 LAB — GLUCOSE, CAPILLARY: Glucose-Capillary: 315 mg/dL — ABNORMAL HIGH (ref 70–99)

## 2013-01-19 LAB — CBC
Hemoglobin: 8.8 g/dL — ABNORMAL LOW (ref 12.0–15.0)
MCHC: 32.6 g/dL (ref 30.0–36.0)
WBC: 8 10*3/uL (ref 4.0–10.5)

## 2013-01-19 LAB — POTASSIUM: Potassium: 5.1 mEq/L (ref 3.5–5.1)

## 2013-01-19 MED ORDER — LIRAGLUTIDE 18 MG/3ML ~~LOC~~ SOLN: 1.8000 mg | Freq: Every day | SUBCUTANEOUS | Status: DC

## 2013-01-19 MED ORDER — GLIMEPIRIDE 1 MG PO TABS
0.5000 mg | ORAL_TABLET | Freq: Every day | ORAL | Status: DC
  Administered 2013-01-19: 0.5 mg via ORAL

## 2013-01-19 NOTE — Progress Notes (Signed)
[facility] PROGRESS NOTE  Hailey Curry JYN:[id number] DOB: [birth year] DOA: 01/18/2013 PCP: Colette Ribas, MD  Assessment/Plan: Acute blood loss anemia: due to GI bleed s/p polypectomy and on coumadin/lovenox. Hg currently 8.0. No further bleeding. See #2. Pt remains symptomatic. Will transfuse 1 unit PRBC's. Recheck in am. Ambulate in hall.   Active Problems:  GI bleed:  S/p colonoscopy with large amount of blood and clot in rectum 01/18/13. No further bleeding. Appreciate GI assistance. Diet advanced and tolerating well.    Acute renal failure: likely related to #2 and decreased po intake. Creatinine trending up today. Gentle hydration po. Recheck.    DM (diabetes mellitus): uncontrolled. Will give basal per diabetes coordinator. Will need OP follow up. Continue SSI.   S/P AVR (aortic valve replacement): dr. Hazle Coca office manages coumadin. Coumadin resumed at 7.5 after colonoscopy yesterday.    Hypertension: fair control. Will continue to  hold anti-hypertensive meds for now. Monitor closely   Colonic adenoma: close surveillance per GI note   Code Status: full Family Communication: husband at bedside Disposition Plan: home likely tomorrow  Consultants:  GI  Procedures:  Colonoscopy 01/18/13  Antibiotics:  none  HPI/Subjective: Awake alert NAD  Objective: Filed Vitals:   01/18/13 2106 01/19/13 0551 01/19/13 1224 01/19/13 1301  BP: 142/64 144/79 126/75 112/68  Pulse: 95 101 88 90  Temp: 98.1 F (36.7 C) 98.3 F (36.8 C) 98.9 F (37.2 C) 98.3 F (36.8 C)  TempSrc: Oral  Oral Oral  Resp: 20 16 18 18   Height:      Weight:  122.653 kg (270 lb 6.4 oz)    SpO2: 94% 94%      Intake/Output Summary (Last 24 hours) at 01/19/13 1309 Last data filed at 01/19/13 1301  Gross per 24 hour  Intake 3268.75 ml  Output    575 ml  Net 2693.75 ml   Filed Weights   01/18/13 1136 01/19/13 0551  Weight: 117.028 kg (258 lb) 122.653 kg (270 lb 6.4 oz)     Exam:   General:  Obese alert NAD  Cardiovascular: tachycardic, regular, No MGR  Respiratory: normal effort BSCTAB no wheeze no rhonchi  Abdomen: soft +BS non-tender   Musculoskeletal: no clubbing no cyanosis   Data Reviewed: Basic Metabolic Panel:  Recent Labs Lab 01/18/13 0730 01/19/13 0528 01/19/13 0844  NA 141 137  --   K 4.4 5.6* 5.1  CL 107 105  --   CO2 26 23  --   GLUCOSE 336* 322*  --   BUN 26* 27*  --   CREATININE 1.31* 1.59*  --   CALCIUM 8.4 8.7  --    Liver Function Tests: No results found for this basename: AST, ALT, ALKPHOS, BILITOT, PROT, ALBUMIN,  in the last 168 hours No results found for this basename: LIPASE, AMYLASE,  in the last 168 hours No results found for this basename: AMMONIA,  in the last 168 hours CBC:  Recent Labs Lab 01/12/13 1350 01/18/13 0730 01/18/13 1552 01/18/13 2256 01/19/13 0528 01/19/13 0844  WBC 5.5 4.7  --   --  8.0  --   NEUTROABS 3.3 3.1  --   --   --   --   HGB 12.1 9.0* 8.6* 7.9* 8.8* 8.0*  HCT 35.0* 26.9* 26.0* 23.6* 27.0* 24.1*  MCV 83.1 85.1  --   --  87.1  --   PLT 152 112*  --   --  185  --    Cardiac Enzymes: No  results found for this basename: CKTOTAL, CKMB, CKMBINDEX, TROPONINI,  in the last 168 hours BNP (last 3 results) No results found for this basename: PROBNP,  in the last 8760 hours CBG:  Recent Labs Lab 01/18/13 1146 01/18/13 1651 01/18/13 2109 01/19/13 0748 01/19/13 1132  GLUCAP 279* 353* 211* 315* 232*    No results found for this or any previous visit (from the past 240 hour(s)).   Studies: No results found.  Scheduled Meds: . docusate sodium  100 mg Oral BID  . escitalopram  20 mg Oral Daily  . fluticasone  1 spray Each Nare Daily  . glimepiride  0.5 mg Oral QHS  . insulin aspart  0-9 Units Subcutaneous TID WC  . Liraglutide  1.8 mg Subcutaneous QAC breakfast  . loratadine  10 mg Oral BH-q7a  . metFORMIN  1,000 mg Oral QHS  . sodium chloride  3 mL Intravenous Q12H   . warfarin  7.5 mg Oral q1800  . Warfarin - Physician Dosing Inpatient   Does not apply q1800   Continuous Infusions: . sodium chloride 75 mL/hr at 01/18/13 2329    Principal Problem:   Acute blood loss anemia Active Problems:   S/P AVR (aortic valve replacement)   Hypertension   Colonic adenoma   GI bleed   Acute renal failure   DM (diabetes mellitus)    Time spent: 30  minutes    [facility] M  [facility]  If 7PM-7AM, please contact night-coverage at [email or web address], password [facility] 01/19/2013, 1:09 PM  LOS: 1 day   Attending note:  Patient interviewed and examined.  No significant extensive bleeding. Feels dizzy and weak.  Transfuse and monitor for 24 hours.  Coumadin resumed and nearly therapeutic.  Crista Curb, M.D.

## 2013-01-19 NOTE — Progress Notes (Signed)
UR Chart Review Completed  

## 2013-01-19 NOTE — Progress Notes (Signed)
REVIEWED.  

## 2013-01-19 NOTE — Progress Notes (Signed)
Subjective: No further rectal bleeding, feels much improved from yesterday. BM this morning, no rectal bleeding.   Objective: Vital signs in last 24 hours: Temp:  [97.3 F (36.3 C)-99.1 F (37.3 C)] 98.3 F (36.8 C) (04/22 0551) Pulse Rate:  [82-114] 101 (04/22 0551) Resp:  [15-23] 16 (04/22 0551) BP: (112-186)/(40-83) 144/79 mmHg (04/22 0551) SpO2:  [93 %-100 %] 94 % (04/22 0551) Weight:  [258 lb (117.028 kg)-270 lb 6.4 oz (122.653 kg)] 270 lb 6.4 oz (122.653 kg) (04/22 0551) Last BM Date: 01/18/13 General:   Alert and oriented, pleasant Head:  Normocephalic and atraumatic. Eyes:  No icterus, sclera clear. Conjuctiva pink.  Heart:  S1, S2 present  Lungs: Clear to auscultation bilaterally, without wheezing, rales, or rhonchi.  Abdomen:  Bowel sounds present, soft, non-tender, non-distended. No HSM or hernias noted. No rebound or guarding. No masses appreciated  Msk:  Symmetrical without gross deformities. Normal posture. Extremities:  Without clubbing or edema. Neurologic:  Alert and  oriented x4;  grossly normal neurologically. Skin:  Warm and dry, intact without significant lesions.  Psych:  Alert and cooperative. Normal mood and affect.  Intake/Output from previous day: 04/21 0701 - 04/22 0700 In: 2068.8 [P.O.:960; I.V.:1108.8] Out: 575 [Urine:575] Intake/Output this shift:    Lab Results:  Recent Labs  01/18/13 0730 01/18/13 1552 01/18/13 2256 01/19/13 0528  WBC 4.7  --   --  8.0  HGB 9.0* 8.6* 7.9* 8.8*  HCT 26.9* 26.0* 23.6* 27.0*  PLT 112*  --   --  185   BMET  Recent Labs  01/18/13 0730 01/19/13 0528  NA 141 137  K 4.4 5.6*  CL 107 105  CO2 26 23  GLUCOSE 336* 322*  BUN 26* 27*  CREATININE 1.31* 1.59*  CALCIUM 8.4 8.7   PT/INR  Recent Labs  01/18/13 0730 01/19/13 0528  LABPROT 25.3* 24.0*  INR 2.43* 2.26*     Assessment: 67 year old female on chronic Coumadin secondary to cardiac valve replacement, admitted with post-polypectomy bleed.  Colonoscopy with large amount of blood and clot in rectum, rectal polypectomy site with clot and arterial bleeding, s/p epi and 5 resolutions clips. Clinically improving without further rectal bleeding, Hgb overall stable. Hyperkalemia noted. INR 2.26.     Plan: Continue Coumadin 7.5 mg  Advance diet Hyperkalemia addressed per hospitalist Hopeful d/c later today or in early morning Follow-up with Dr. Allyson Sabal at end of week for further Coumadin dosing  Nira Retort, ANP-BC [facility]    LOS: 1 day    01/19/2013, 8:02 AM

## 2013-01-19 NOTE — Progress Notes (Signed)
PHARMACIST - PHYSICIAN COMMUNICATION DR:  Lendell Caprice CONCERNING:  METFORMIN SAFE ADMINISTRATION POLICY  RECOMMENDATION: Metformin has been placed on DISCONTINUE (rejected order) STATUS and should be reordered only after any of the conditions below are ruled out.  Current safety recommendations include avoiding metformin for a minimum of 48 hours after the patient's exposure to intravenous contrast media.  DESCRIPTION:  The Pharmacy Committee has adopted a policy that restricts the use of metformin in hospitalized patients until all the contraindications to administration have been ruled out. Specific contraindications are: []  Serum creatinine ? 1.5 for males [x]  Serum creatinine ? 1.4 for females []  Shock, acute MI, sepsis, hypoxemia, dehydration []  Planned administration of intravenous iodinated contrast media []  Heart Failure patients with low EF []  Acute or chronic metabolic acidosis (including DKA)   S. Margo Aye, PharmD

## 2013-01-19 NOTE — Progress Notes (Signed)
Inpatient Diabetes Program Recommendations  AACE/ADA: New Consensus Statement on Inpatient Glycemic Control (2013)  Target Ranges:  Prepandial:   less than 140 mg/dL      Peak postprandial:   less than 180 mg/dL (1-2 hours)      Critically ill patients:  140 - 180 mg/dL   Results for NATHALYA, WOLANSKI (MRN [id number]) as of 01/19/2013 08:21  Ref. Range 01/18/2013 11:46 01/18/2013 16:51 01/18/2013 21:09  Glucose-Capillary Latest Range: 70-99 mg/dL 782 (H) 956 (H) 213 (H)    Inpatient Diabetes Program Recommendations Insulin - Basal: Please consider ordering low dose basal insulin while inpatient.  Recommend Levemir 20 units daily.  Note: Patient has a history of diabetes and takes Amaryl 0.5 mg QAM, Victoza 1.8 mg QAM, and Metformin 500 mg BID at home for diabetes management.  Currently, patient is ordered to receive Novolog 0-9 units AC, Amaryl 0.5 mg QAM, Victoza 1.8 mg QAM, and Metformin 1000 mg QHS for inpatient glycemic control.  Blood glucose ranged from 211-353 on 01/18/13.  Fasting blood glucose this morning was 322 mg/dl.  Please consider starting patient on basal insulin to improve glycemic control.  Recommend Levemir 20 units daily.  Will continue to follow.  Thanks, Orlando Penner, RN, BSN, CCRN Diabetes Coordinator Inpatient Diabetes Program [phone]

## 2013-01-20 ENCOUNTER — Other Ambulatory Visit: Payer: Self-pay

## 2013-01-20 ENCOUNTER — Other Ambulatory Visit: Payer: Self-pay | Admitting: Gastroenterology

## 2013-01-20 ENCOUNTER — Encounter (HOSPITAL_COMMUNITY): Payer: Self-pay | Admitting: Internal Medicine

## 2013-01-20 DIAGNOSIS — D62 Acute posthemorrhagic anemia: Secondary | ICD-10-CM

## 2013-01-20 DIAGNOSIS — Z7901 Long term (current) use of anticoagulants: Secondary | ICD-10-CM

## 2013-01-20 DIAGNOSIS — N179 Acute kidney failure, unspecified: Secondary | ICD-10-CM

## 2013-01-20 LAB — BASIC METABOLIC PANEL
BUN: 31 mg/dL — ABNORMAL HIGH (ref 6–23)
Chloride: 105 mEq/L (ref 96–112)
GFR calc Af Amer: 37 mL/min — ABNORMAL LOW (ref >90)
Glucose, Bld: 263 mg/dL — ABNORMAL HIGH (ref 70–99)
Potassium: 4.8 mEq/L (ref 3.5–5.1)
Sodium: 138 mEq/L (ref 135–145)

## 2013-01-20 LAB — TYPE AND SCREEN
Antibody Screen: NEGATIVE
Unit division: 0

## 2013-01-20 LAB — GLUCOSE, CAPILLARY
Glucose-Capillary: 322 mg/dL — ABNORMAL HIGH (ref 70–99)
Glucose-Capillary: 229 mg/dL — ABNORMAL HIGH (ref 70–99)
Glucose-Capillary: 234 mg/dL — ABNORMAL HIGH (ref 70–99)

## 2013-01-20 LAB — CBC
HCT: 28.7 % — ABNORMAL LOW (ref 36.0–46.0)
Hemoglobin: 9.5 g/dL — ABNORMAL LOW (ref 12.0–15.0)
RDW: 15.4 % (ref 11.5–15.5)
WBC: 7.3 10*3/uL (ref 4.0–10.5)

## 2013-01-20 MED ORDER — WARFARIN SODIUM 5 MG PO TABS: ORAL_TABLET | ORAL | Status: DC

## 2013-01-20 MED ORDER — GLIMEPIRIDE 1 MG PO TABS: 4.0000 mg | ORAL_TABLET | Freq: Every day | ORAL | Status: DC

## 2013-01-20 NOTE — Progress Notes (Signed)
REVIEWED.  

## 2013-01-20 NOTE — Progress Notes (Signed)
Lab order faxed to the lab. 

## 2013-01-20 NOTE — Plan of Care (Signed)
Problem: Discharge Progression Outcomes Goal: Activity appropriate for discharge plan Outcome: Adequate for Discharge Pt d/c home with family, d/c instructions given to pt and pt's husband both verbalize understanding of instructions.

## 2013-01-20 NOTE — Progress Notes (Signed)
PT/INR orders marked as "stat" printed out. To be faxed to [facility] lab. Patient aware to have completed Friday morning.  Please ask patient if she has an appt with Dr. Allyson Sabal for Monday.

## 2013-01-20 NOTE — Discharge Summary (Signed)
Physician Discharge Summary  Hailey Curry ZOX:[id number] DOB: 1945/12/21 DOA: 01/18/2013  PCP: Colette Ribas, MD  Admit date: 01/18/2013 Discharge date: 01/20/2013  Time spent: 40 minutes  Recommendations for Outpatient Follow-up:  1. Dr Phillips Odor PCP in 1 week. Recommend bmet and cbc for monitoring of anemia and renal function. Recommend close outpatient follow up for optimal glycemic control. 2. Dr. Jena Gauss GI. Office will call for outpatient follow up.  3. Dr. Hazle Coca office 01/22/13 for INR check  Discharge Diagnoses:  Principal Problem:   Acute blood loss anemia Active Problems:   S/P AVR (aortic valve replacement)   Hypertension   Colonic adenoma   GI bleed   Acute renal failure   DM (diabetes mellitus)   Discharge Condition: stable  Diet recommendation: carb modified  Filed Weights   01/18/13 1136 01/19/13 0551 01/20/13 0640  Weight: 117.028 kg (258 lb) 122.653 kg (270 lb 6.4 oz) 123.469 kg (272 lb 3.2 oz)    History of present illness:  NYARA CAPELL is a very pleasant 67 y.o. female with past medical hx that includes HTN, DM, AS s/p valve replacement and CABG, breast cancer and colonic adenoma and most recently colonoscopy and polypectomy on 01/06/13 with multiple colonic and rectal polyps noted who presented to ED on 01/18/13 with BRBPR. Information obtained from patient and husband who was at bedside. Pt sated her coumadin was restarted on 4/10 with lovenox bridge started 4/12. She had mild bleeding initially that resolved and she was instructed by GI to keep coumadin at 5mg . She stated 4 days prior she spoke to Dr. Hazle Coca office and coumadin was increased to 7.5mg . She stated shortly thereafter the BRBPR resumed. She denied abdominal pain/nausea/vomiting. Associated symptoms included lightheadedness, shortness of breath with exertion. Denied headache, cp, palpitations. Symptoms came on suddenly were intermittent, characterized as persistent and moderate. In ED she  was noted to have Hg 9 which is 3 gm drop from 4 days prior, INR 2.43, HR 89 BP 120/50 and creatinine 1.3 and glucose 336. In ED she was seen by GI who planned to scope that afternoon after tap water enema and dose of Vit K. TRH asked to admit.   Hospital Course:  Acute blood loss anemia: due to GI bleed s/p polypectomy and on coumadin/lovenox.  Pt admitted to tele. Hg remained stable around 8.0 with no further bleeding after colonoscopy but pt remained symptomatic. She was transfused 1 unit PRBC's. On day of discharge Hg 9.5. On day of discharge pt asymptomatic as well. Recommend outpatient cbc 1 week with PCP  Active Problems:  GI bleed: S/p colonoscopy with large amount of blood and clot in rectum 01/18/13. No further bleeding. Appreciate GI assistance. Diet advanced and tolerat ed well. On day of discharge tolerating carb modified diet. No further bleeding. Dr. Loran Senters office will contact for follow up appointment  Acute renal failure: likely related to #2 and decreased po intake. IV fluids give judiciously. Creatinine trended up slightly. Metformin discontinued.chart review with data from 2 years ago indicate baseline range 0.9-1.1. Provided with gentle hydration with IV fluids for 1 day. Recommend close outpatient follow up with bmet.   DM (diabetes mellitus): uncontrolled. A1c 7.7.  Used SSI during hospitalization. Discontinued metformin due to renal function. Increased amaryl.  Will need OP follow up.   S/P AVR (aortic valve replacement): dr. Hazle Coca office manages coumadin. Has appointment for INR check 01/22/13. At discharge INR 2.63. Goal 2.5-3.5. Will keep coumadin at 5 mg 4 days week and  7.5mg  3 days week trying to stay near lower end of goal range.    Hypertension: fair control. Initially antihypertensive meds held. Then resumed and BP remained borderline. Will continue home benicar hct at discharge but recommend bmet in one week for monitoring of renal function.   Colonic adenoma: close  surveillance per GI note      Procedures:  Colonoscopy 01/18/13  Consultations:  Dr. Jena Gauss GI  Discharge Exam: Filed Vitals:   01/19/13 1500 01/19/13 1600 01/19/13 2206 01/20/13 0640  BP: 134/78 137/75 129/95 149/52  Pulse: 90 86 85 100  Temp: 98.5 F (36.9 C) 98.4 F (36.9 C) 98.4 F (36.9 C) 98.1 F (36.7 C)  TempSrc: Oral Oral Oral Oral  Resp: 18 20  20   Height:      Weight:    123.469 kg (272 lb 3.2 oz)  SpO2:   94% 93%    General: alert obese NAD Cardiovascular: RRR No gallup no rub trace LE edema Respiratory: normal effort BS clear bilaterally no rales  Abdomen: obese, soft +BS non-tender  Discharge Instructions  Discharge Orders   Future Orders Complete By Expires     Call MD for:  difficulty breathing, headache or visual disturbances  As directed     Call MD for:  persistant dizziness or light-headedness  As directed     Call MD for:  persistant nausea and vomiting  As directed     Diet Carb Modified  As directed     Discharge instructions  As directed     Comments:      Monitor CBG daily at meals and bedtime. Keep journal of readings. Take readings to PCP follow up appointment    Increase activity slowly  As directed         Medication List    STOP taking these medications       enoxaparin 100 MG/ML injection  Commonly known as:  LOVENOX     metFORMIN 500 MG 24 hr tablet  Commonly known as:  GLUCOPHAGE-XR      TAKE these medications       acetaminophen 500 MG tablet  Commonly known as:  TYLENOL  Take 500 mg by mouth every 6 (six) hours as needed for pain.     cyanocobalamin 1000 MCG/ML injection  Commonly known as:  (VITAMIN B-12)  Inject 1,000 mcg into the muscle every 30 (thirty) days.     escitalopram 20 MG tablet  Commonly known as:  LEXAPRO  Take 20 mg by mouth daily.     glimepiride 1 MG tablet  Commonly known as:  AMARYL  Take 4 tablets (4 mg total) by mouth daily before breakfast.     loratadine 10 MG tablet  Commonly  known as:  CLARITIN  Take 10 mg by mouth every morning.     mometasone 50 MCG/ACT nasal spray  Commonly known as:  NASONEX  Place 2 sprays into the nose daily as needed (Allergies).     olmesartan-hydrochlorothiazide 20-12.5 MG per tablet  Commonly known as:  BENICAR HCT  Take 1 tablet by mouth every morning.     VICTOZA 18 MG/3ML Soln injection  Generic drug:  Liraglutide  Inject 1.8 mg into the skin every morning.     warfarin 5 MG tablet  Commonly known as:  COUMADIN  Take 1.5 pills Monday Wednesday Friday, all other days take 1 pill.           Follow-up Information   Follow up with Assunta Found  CABOT, MD. Schedule an appointment as soon as possible for a visit in 1 week. (recommend cbc and bmet to evaluate anemia and renal function. )    Contact information:   [address] [place] [address] [phone]       Follow up with Eula Listen, MD. (office will contact for appointment. )    Contact information:   [address] [place] [address] [phone]       Follow up with Runell Gess, MD. (has appointment 4/25 for INR check)    Contact information:   [address] [place] [address] [phone]        The results of significant diagnostics from this hospitalization (including imaging, microbiology, ancillary and laboratory) are listed below for reference.    Significant Diagnostic Studies: No results found.  Microbiology: No results found for this or any previous visit (from the past 240 hour(s)).   Labs: Basic Metabolic Panel:  Recent Labs Lab 01/18/13 0730 01/19/13 0528 01/19/13 0844 01/20/13 0524  NA 141 137  --  138  K 4.4 5.6* 5.1 4.8  CL 107 105  --  105  CO2 26 23  --  26  GLUCOSE 336* 322*  --  263*  BUN 26* 27*  --  31*  CREATININE 1.31* 1.59*  --  1.60*  CALCIUM 8.4 8.7  --  8.6   Liver Function Tests: No results found for this basename: AST,  ALT, ALKPHOS, BILITOT, PROT, ALBUMIN,  in the last 168 hours No results found for this basename: LIPASE, AMYLASE,  in the last 168 hours No results found for this basename: AMMONIA,  in the last 168 hours CBC:  Recent Labs Lab 01/18/13 0730 01/18/13 1552 01/18/13 2256 01/19/13 0528 01/19/13 0844 01/20/13 0524  WBC 4.7  --   --  8.0  --  7.3  NEUTROABS 3.1  --   --   --   --   --   HGB 9.0* 8.6* 7.9* 8.8* 8.0* 9.5*  HCT 26.9* 26.0* 23.6* 27.0* 24.1* 28.7*  MCV 85.1  --   --  87.1  --  87.0  PLT 112*  --   --  185  --  146*   Cardiac Enzymes: No results found for this basename: CKTOTAL, CKMB, CKMBINDEX, TROPONINI,  in the last 168 hours BNP: BNP (last 3 results) No results found for this basename: PROBNP,  in the last 8760 hours CBG:  Recent Labs Lab 01/19/13 0748 01/19/13 1132 01/19/13 1644 01/19/13 2203 01/20/13 0719  GLUCAP 315* 232* 212* 322* 229*   Signed:  Toya Smothers M  [facility] 01/20/2013, 10:22 AM  Seen and agree.  Crista Curb, M.D.

## 2013-01-20 NOTE — Progress Notes (Signed)
Subjective: No rectal bleeding, received 1 unit PRBCs yesterday with improvement of Hgb. Wants to go home.   Objective: Vital signs in last 24 hours: Temp:  [98.1 F (36.7 C)-98.9 F (37.2 C)] 98.1 F (36.7 C) (04/23 0640) Pulse Rate:  [85-100] 100 (04/23 0640) Resp:  [18-20] 20 (04/23 0640) BP: (112-149)/(52-95) 149/52 mmHg (04/23 0640) SpO2:  [93 %-94 %] 93 % (04/23 0640) Weight:  [272 lb 3.2 oz (123.469 kg)] 272 lb 3.2 oz (123.469 kg) (04/23 0640) Last BM Date: 01/19/13 General:   Alert and oriented, pleasant Head:  Normocephalic and atraumatic. Eyes:  No icterus, sclera clear. Conjuctiva pink.  Heart:  S1, S2 present Lungs: Clear to auscultation bilaterally, without wheezing, rales, or rhonchi.  Abdomen:  Bowel sounds present, soft, non-tender, non-distended.  Msk:  Symmetrical without gross deformities. Normal posture. Skin:  Warm and dry, intact without significant lesions.  Psych:  Alert and cooperative. Normal mood and affect.  Intake/Output from previous day: 04/22 0701 - 04/23 0700 In: 2390 [P.O.:840; I.V.:500; Blood:1050] Out: 250 [Urine:250] Intake/Output this shift:    Lab Results:  Recent Labs  01/18/13 0730  01/19/13 0528 01/19/13 0844 01/20/13 0524  WBC 4.7  --  8.0  --  7.3  HGB 9.0*  < > 8.8* 8.0* 9.5*  HCT 26.9*  < > 27.0* 24.1* 28.7*  PLT 112*  --  185  --  146*  < > = values in this interval not displayed. BMET  Recent Labs  01/18/13 0730 01/19/13 0528 01/19/13 0844 01/20/13 0524  NA 141 137  --  138  K 4.4 5.6* 5.1 4.8  CL 107 105  --  105  CO2 26 23  --  26  GLUCOSE 336* 322*  --  263*  BUN 26* 27*  --  31*  CREATININE 1.31* 1.59*  --  1.60*  CALCIUM 8.4 8.7  --  8.6   PT/INR  Recent Labs  01/19/13 0528 01/20/13 0731  LABPROT 24.0* 26.8*  INR 2.26* 2.63*    Assessment: 67 year old female with post-polypectomy bleed in the setting of Coumadin and Lovenox (hx of mechanical valve replacement) requiring epi and 5 resolution  clips during therapeutic colonoscopy, clinically improved. Hgb continues to improve, s/p 1 u PRBCs yesterday. No further bleeding, recommend discharge home today with close follow-up for INR with Dr. Hazle Coca office.      Plan: Anticipate d/c home today Follow-up with Dr. Allyson Sabal regarding Coumadin dosing Will make follow-up appointment with our office as outpatient    Nira Retort, ANP-BC [facility]     LOS: 2 days    01/20/2013, 8:45 AM    ADDENDUM: Patient to have stat INR drawn on Friday to ensure stable. She will have this done at [facility]; our office will have the orders faxed and waiting there. Will need follow-up with cardiology that Monday. Patient and husband aware.

## 2013-01-21 ENCOUNTER — Other Ambulatory Visit: Payer: Self-pay

## 2013-01-21 DIAGNOSIS — D62 Acute posthemorrhagic anemia: Secondary | ICD-10-CM

## 2013-01-21 NOTE — Progress Notes (Signed)
All lab orders have been faxed to the lab and are marked STAT.

## 2013-01-21 NOTE — Progress Notes (Signed)
Pt and pts husband are aware. They are also requesting an H&H to recheck Hgb. Per AS- ok to order with INR. Pt is going to see Dr. Allyson Sabal on Monday.

## 2013-01-22 LAB — PROTIME-INR: INR: 2.55 — ABNORMAL HIGH (ref <1.50)

## 2013-01-22 NOTE — Progress Notes (Signed)
Quick Note:  Stable hgb at 9.3. ______

## 2013-01-22 NOTE — Progress Notes (Signed)
Quick Note:  Patient is to continue Coumadin at 7.5 mg daily. Patient has been contacted. Aware to follow-up with Dr. Hazle Coca office on Monday. ______

## 2013-01-25 ENCOUNTER — Telehealth: Payer: Self-pay | Admitting: General Practice

## 2013-01-25 NOTE — Telephone Encounter (Signed)
Patient stated she can't get the requested lab work done at [facility].  She wanted to know if you could send the lab to the op lab across from [facility].

## 2013-01-25 NOTE — Telephone Encounter (Signed)
Ginger spoke with patient She will be seen by cardiology tomorrow, April 29th, for INR check and further adjustment of Coumadin as indicated.

## 2013-02-16 ENCOUNTER — Ambulatory Visit: Payer: Medicare Other

## 2013-02-23 ENCOUNTER — Ambulatory Visit (INDEPENDENT_AMBULATORY_CARE_PROVIDER_SITE_OTHER): Payer: Medicare Other | Admitting: Pharmacist Clinician (PhC)/ Clinical Pharmacy Specialist

## 2013-02-23 ENCOUNTER — Other Ambulatory Visit (HOSPITAL_COMMUNITY): Payer: Self-pay | Admitting: Family Medicine

## 2013-02-23 DIAGNOSIS — Z139 Encounter for screening, unspecified: Secondary | ICD-10-CM

## 2013-02-23 DIAGNOSIS — Z7901 Long term (current) use of anticoagulants: Secondary | ICD-10-CM

## 2013-02-23 DIAGNOSIS — Z952 Presence of prosthetic heart valve: Secondary | ICD-10-CM

## 2013-02-23 DIAGNOSIS — Z954 Presence of other heart-valve replacement: Secondary | ICD-10-CM

## 2013-03-02 ENCOUNTER — Ambulatory Visit (HOSPITAL_COMMUNITY): Payer: Medicare Other

## 2013-03-15 ENCOUNTER — Telehealth: Payer: Self-pay

## 2013-03-15 NOTE — Telephone Encounter (Signed)
Patient had questions regarding prior colonoscopies. Hx of diverticula noted on TCS several years ago. Discussed avoidance/significantly limiting seed-containing fruits. She will see [place] again in Aug/Sept.

## 2013-03-15 NOTE — Telephone Encounter (Signed)
Pt had surgery in April and would like to talk to someone about the results. She also has some question also. She would like for someone to call her back Please [phone]  [phone]

## 2013-03-15 NOTE — Telephone Encounter (Signed)
She would like to just talk with someone

## 2013-03-15 NOTE — Telephone Encounter (Signed)
Can we find out exactly what the questions are

## 2013-03-26 ENCOUNTER — Ambulatory Visit: Payer: Medicare Other | Admitting: Pharmacist Clinician (PhC)/ Clinical Pharmacy Specialist

## 2013-03-30 ENCOUNTER — Ambulatory Visit: Payer: Medicare Other | Admitting: Pharmacist Clinician (PhC)/ Clinical Pharmacy Specialist

## 2013-04-06 ENCOUNTER — Other Ambulatory Visit: Payer: Self-pay

## 2013-04-07 MED ORDER — WARFARIN SODIUM 5 MG PO TABS: ORAL_TABLET | ORAL | Status: DC

## 2013-04-11 ENCOUNTER — Encounter: Payer: Self-pay | Admitting: *Deleted

## 2013-04-13 ENCOUNTER — Encounter: Payer: Self-pay | Admitting: Cardiovascular Disease

## 2013-04-14 ENCOUNTER — Encounter: Payer: Self-pay | Admitting: Cardiovascular Disease

## 2013-04-14 ENCOUNTER — Ambulatory Visit (INDEPENDENT_AMBULATORY_CARE_PROVIDER_SITE_OTHER): Payer: Medicare Other | Admitting: Cardiovascular Disease

## 2013-04-14 ENCOUNTER — Ambulatory Visit: Payer: Medicare Other | Admitting: Cardiovascular Disease

## 2013-04-14 VITALS — BP 142/72 | HR 84 | Ht 63.75 in | Wt 256.0 lb

## 2013-04-14 DIAGNOSIS — Z954 Presence of other heart-valve replacement: Secondary | ICD-10-CM

## 2013-04-14 DIAGNOSIS — Z952 Presence of prosthetic heart valve: Secondary | ICD-10-CM

## 2013-04-14 DIAGNOSIS — I10 Essential (primary) hypertension: Secondary | ICD-10-CM

## 2013-04-14 MED ORDER — HYDROCHLOROTHIAZIDE 12.5 MG PO CAPS: 12.5000 mg | ORAL_CAPSULE | Freq: Every day | ORAL | Status: DC

## 2013-04-14 NOTE — Progress Notes (Signed)
04/14/2013 Hailey Curry   September 08, 1946  [id number]  Primary Physician Colette Ribas, MD Primary Cardiologist: Runell Gess MD [name]   HPI:  The patient is a 67 year old moderately overweight married Caucasian female with no children, whose husband is also a patient of mine and is accompanying her today. I saw her approximately 6 months ago. 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 Jude AVR with a 21 mm St. Jude Regent mechanical valve by Dr. Rexanne Mano, August 01, 2010. She had normal coronary arteries and normal left ventricular function. She did have some perioperative PAF. She has sleep apnea, intolerant to CPAP. Her other problems include hypertension, hyperlipidemia intolerant to statin drugs, and non-insulin-requiring diabetes. She is otherwise asymptomatic. Recent blood work revealed a total cholesterol of 262, LDL of 166, and HDL of 44. She does admit to dietary indiscretion.since I saw her last a year ago she denies chest pain or shortness of breath. She does admit to fatigue which may be multifactorial including sleep apnea with though she does not wear CPAP as well as moderate obesity.    Current Outpatient Prescriptions  Medication Sig Dispense Refill  . acetaminophen (TYLENOL) 500 MG tablet Take 500 mg by mouth every 6 (six) hours as needed for pain.      . citalopram (CELEXA) 40 MG tablet Take 1 tablet by mouth every evening.      . cyanocobalamin (,VITAMIN B-12,) 1000 MCG/ML injection Inject 1,000 mcg into the muscle every 30 (thirty) days.      Tery Sanfilippo Sodium (COLACE PO) Take 1 tablet by mouth 2 (two) times daily.      Marland Kitchen escitalopram (LEXAPRO) 20 MG tablet Take 20 mg by mouth daily.       . fluticasone (FLONASE) 50 MCG/ACT nasal spray Place 2 sprays into the nose daily.      Marland Kitchen loratadine (CLARITIN) 10 MG tablet Take 10 mg by mouth every morning.      . olmesartan-hydrochlorothiazide (BENICAR HCT) 20-12.5 MG per tablet Take  1 tablet by mouth every morning.      . warfarin (COUMADIN) 5 MG tablet Take 1-1.5 tablets by mouth daily, future refills to Dr. Renette Butters  40 tablet  0   No current facility-administered medications for this visit.    Allergies  Allergen Reactions  . Demerol Other (See Comments)    Blood pressure goes up    History   Social History  . Marital Status: Married    Spouse Name: N/A    Number of Children: N/A  . Years of Education: N/A   Occupational History  . Not on file.   Social History Main Topics  . Smoking status: Never Smoker   . Smokeless tobacco: Never Used  . Alcohol Use: No  . Drug Use: No  . Sexually Active: Not on file   Other Topics Concern  . Not on file   Social History Narrative  . No narrative on file     Review of Systems: General: negative for chills, fever, night sweats or weight changes.  Cardiovascular: negative for chest pain, dyspnea on exertion, edema, orthopnea, palpitations, paroxysmal nocturnal dyspnea or shortness of breath Dermatological: negative for rash Respiratory: negative for cough or wheezing Urologic: negative for hematuria Abdominal: negative for nausea, vomiting, diarrhea, bright red blood per rectum, melena, or hematemesis Neurologic: negative for visual changes, syncope, or dizziness All other systems reviewed and are otherwise negative except as noted above.    Blood  pressure 142/72, pulse 84, height 5' 3.75" (1.619 m), weight 256 lb (116.121 kg).  General appearance: alert and no distress Neck: no adenopathy, no carotid bruit, no JVD, supple, symmetrical, trachea midline and thyroid not enlarged, symmetric, no tenderness/mass/nodules Lungs: clear to auscultation bilaterally Heart: crisp prosthetic valve sounds Extremities: extremities normal, atraumatic, no cyanosis or edema  EKG sinus rhythm at 84 with right bundle-branch block unchanged from prior EKG  ASSESSMENT AND PLAN:   S/P AVR (aortic valve replacement) She is  on Coumadin anticoagulation but we follow here in our practice.we'll followup a 2-D echo  Hypertension We will change her Benicar hydrochlorothiazide to hydrochlorothiazide alone she feels the Benicar causes side effects. She will take daily blood pressures and keep a blood pressure log. She'll followup with Belenda Cruise, or Pharm.D., in the office in one month for blood pressure followup.      Runell Gess MD FACP,FACC,FAHA, [facility] 04/14/2013 12:15 PM .

## 2013-04-14 NOTE — Assessment & Plan Note (Signed)
She is on Coumadin anticoagulation but we follow here in our practice.we'll followup a 2-D echo

## 2013-04-14 NOTE — Patient Instructions (Addendum)
  We will see you back in follow up in 12 months to see Dr Allyson Sabal and 1 months to see Belenda Cruise for a blood pressure check  Dr Allyson Sabal has ordered an echocardiogram  Stop your Benicar HCT   Start HCTZ 12.5mg  daily

## 2013-04-14 NOTE — Assessment & Plan Note (Signed)
We will change her Benicar hydrochlorothiazide to hydrochlorothiazide alone she feels the Benicar causes side effects. She will take daily blood pressures and keep a blood pressure log. She'll followup with Belenda Cruise, or Pharm.D., in the office in one month for blood pressure followup.

## 2013-04-15 ENCOUNTER — Ambulatory Visit: Payer: Medicare Other | Admitting: Cardiovascular Disease

## 2013-05-04 ENCOUNTER — Other Ambulatory Visit: Payer: Self-pay | Admitting: Cardiovascular Disease

## 2013-05-04 ENCOUNTER — Ambulatory Visit (HOSPITAL_COMMUNITY): Payer: Medicare Other

## 2013-05-04 ENCOUNTER — Inpatient Hospital Stay (HOSPITAL_COMMUNITY): Admission: RE | Admit: 2013-05-04 | Payer: Medicare Other | Source: Ambulatory Visit

## 2013-05-04 NOTE — Telephone Encounter (Signed)
Rx was sent to pharmacy electronically. 

## 2013-05-05 ENCOUNTER — Other Ambulatory Visit: Payer: Self-pay

## 2013-05-05 ENCOUNTER — Ambulatory Visit (HOSPITAL_COMMUNITY)
Admission: RE | Admit: 2013-05-05 | Discharge: 2013-05-05 | Disposition: A | Discharge status: Home / Self Care | Payer: Medicare Other | Source: Ambulatory Visit | Attending: Cardiovascular Disease | Admitting: Cardiovascular Disease

## 2013-05-05 DIAGNOSIS — I359 Nonrheumatic aortic valve disorder, unspecified: Secondary | ICD-10-CM

## 2013-05-05 DIAGNOSIS — Z952 Presence of prosthetic heart valve: Secondary | ICD-10-CM

## 2013-05-07 ENCOUNTER — Encounter: Payer: Self-pay | Admitting: *Deleted

## 2013-05-07 ENCOUNTER — Telehealth: Payer: Self-pay | Admitting: *Deleted

## 2013-05-07 DIAGNOSIS — Z952 Presence of prosthetic heart valve: Secondary | ICD-10-CM

## 2013-05-07 NOTE — Telephone Encounter (Signed)
Message copied by Marella Bile on Fri May 07, 2013  1:17 PM ------      Message from: Runell Gess      Created: Thu May 06, 2013  7:21 PM       No change from prior study. Repeat in 12 months. ------

## 2013-05-11 ENCOUNTER — Other Ambulatory Visit: Payer: Self-pay | Admitting: Cardiovascular Disease

## 2013-05-12 NOTE — Telephone Encounter (Signed)
Rx was sent to pharmacy electronically. 

## 2013-05-13 ENCOUNTER — Encounter: Payer: Self-pay | Admitting: Internal Medicine

## 2013-05-15 ENCOUNTER — Encounter (HOSPITAL_COMMUNITY): Payer: Self-pay | Admitting: *Deleted

## 2013-05-15 ENCOUNTER — Emergency Department (HOSPITAL_COMMUNITY)
Admission: EM | Admit: 2013-05-15 | Discharge: 2013-05-15 | Disposition: A | Discharge status: Home / Self Care | Payer: Medicare Other | Attending: Emergency Medicine | Admitting: Emergency Medicine

## 2013-05-15 DIAGNOSIS — Z79899 Other long term (current) drug therapy: Secondary | ICD-10-CM | POA: Insufficient documentation

## 2013-05-15 DIAGNOSIS — Z8679 Personal history of other diseases of the circulatory system: Secondary | ICD-10-CM | POA: Insufficient documentation

## 2013-05-15 DIAGNOSIS — R0602 Shortness of breath: Secondary | ICD-10-CM | POA: Insufficient documentation

## 2013-05-15 DIAGNOSIS — K921 Melena: Secondary | ICD-10-CM | POA: Insufficient documentation

## 2013-05-15 DIAGNOSIS — R350 Frequency of micturition: Secondary | ICD-10-CM | POA: Insufficient documentation

## 2013-05-15 DIAGNOSIS — Z862 Personal history of diseases of the blood and blood-forming organs and certain disorders involving the immune mechanism: Secondary | ICD-10-CM | POA: Insufficient documentation

## 2013-05-15 DIAGNOSIS — Z8669 Personal history of other diseases of the nervous system and sense organs: Secondary | ICD-10-CM | POA: Insufficient documentation

## 2013-05-15 DIAGNOSIS — Z8639 Personal history of other endocrine, nutritional and metabolic disease: Secondary | ICD-10-CM | POA: Insufficient documentation

## 2013-05-15 DIAGNOSIS — E119 Type 2 diabetes mellitus without complications: Secondary | ICD-10-CM | POA: Insufficient documentation

## 2013-05-15 DIAGNOSIS — Z860100 Personal history of colon polyps, unspecified: Secondary | ICD-10-CM | POA: Insufficient documentation

## 2013-05-15 DIAGNOSIS — I10 Essential (primary) hypertension: Secondary | ICD-10-CM | POA: Insufficient documentation

## 2013-05-15 DIAGNOSIS — M549 Dorsalgia, unspecified: Secondary | ICD-10-CM | POA: Insufficient documentation

## 2013-05-15 DIAGNOSIS — Z853 Personal history of malignant neoplasm of breast: Secondary | ICD-10-CM | POA: Insufficient documentation

## 2013-05-15 DIAGNOSIS — N390 Urinary tract infection, site not specified: Secondary | ICD-10-CM | POA: Insufficient documentation

## 2013-05-15 DIAGNOSIS — R0789 Other chest pain: Secondary | ICD-10-CM | POA: Insufficient documentation

## 2013-05-15 LAB — URINALYSIS, ROUTINE W REFLEX MICROSCOPIC
Glucose, UA: NEGATIVE mg/dL
Ketones, ur: NEGATIVE mg/dL
Leukocytes, UA: NEGATIVE
pH: 5.5 (ref 5.0–8.0)

## 2013-05-15 LAB — CBC
HCT: 34.1 % — ABNORMAL LOW (ref 36.0–46.0)
Hemoglobin: 11.1 g/dL — ABNORMAL LOW (ref 12.0–15.0)
MCV: 82 fL (ref 78.0–100.0)
WBC: 6.8 10*3/uL (ref 4.0–10.5)

## 2013-05-15 LAB — COMPREHENSIVE METABOLIC PANEL
Alkaline Phosphatase: 59 U/L (ref 39–117)
BUN: 27 mg/dL — ABNORMAL HIGH (ref 6–23)
Chloride: 104 mEq/L (ref 96–112)
GFR calc Af Amer: 57 mL/min — ABNORMAL LOW (ref >90)
GFR calc non Af Amer: 49 mL/min — ABNORMAL LOW (ref >90)
Glucose, Bld: 245 mg/dL — ABNORMAL HIGH (ref 70–99)
Potassium: 4.2 mEq/L (ref 3.5–5.1)
Total Bilirubin: 1 mg/dL (ref 0.3–1.2)

## 2013-05-15 LAB — PROTIME-INR: Prothrombin Time: 27.5 seconds — ABNORMAL HIGH (ref 11.6–15.2)

## 2013-05-15 LAB — URINE MICROSCOPIC-ADD ON

## 2013-05-15 MED ORDER — CEPHALEXIN 500 MG PO CAPS: 500.0000 mg | ORAL_CAPSULE | Freq: Four times a day (QID) | ORAL | Status: DC

## 2013-05-15 NOTE — ED Provider Notes (Signed)
Medical screening examination/treatment/procedure(s) were conducted as a shared visit with non-physician practitioner(s) and myself.  I personally evaluated the patient during the encounter  The patient seen by me. Patient nontoxic no acute distress. Patient's labs today without any significant abnormalities. Patient's stool is Hemoccult negative. Urinalysis does show 7-10 whites suggestive of urinary tract infection. Patient has had some mild suprapubic abdominal discomfort. Belly is nonsurgical in nature CT scan not required at this point in time we'll treat as urinary tract infection and the patient's not improved in 2 days will followup.   Results for orders placed during the hospital encounter of 05/15/13  URINALYSIS, ROUTINE W REFLEX MICROSCOPIC      Result Value Range   Color, Urine YELLOW  YELLOW   APPearance HAZY (*) CLEAR   Specific Gravity, Urine >1.030 (*) 1.005 - 1.030   pH 5.5  5.0 - 8.0   Glucose, UA NEGATIVE  NEGATIVE mg/dL   Hgb urine dipstick SMALL (*) NEGATIVE   Bilirubin Urine NEGATIVE  NEGATIVE   Ketones, ur NEGATIVE  NEGATIVE mg/dL   Protein, ur NEGATIVE  NEGATIVE mg/dL   Urobilinogen, UA 0.2  0.0 - 1.0 mg/dL   Nitrite NEGATIVE  NEGATIVE   Leukocytes, UA NEGATIVE  NEGATIVE  CBC      Result Value Range   WBC 6.8  4.0 - 10.5 K/uL   RBC 4.16  3.87 - 5.11 MIL/uL   Hemoglobin 11.1 (*) 12.0 - 15.0 g/dL   HCT 45.4 (*) 09.8 - 11.9 %   MCV 82.0  78.0 - 100.0 fL   MCH 26.7  26.0 - 34.0 pg   MCHC 32.6  30.0 - 36.0 g/dL   RDW 14.7  82.9 - 56.2 %   Platelets 105 (*) 150 - 400 K/uL  COMPREHENSIVE METABOLIC PANEL      Result Value Range   Sodium 139  135 - 145 mEq/L   Potassium 4.2  3.5 - 5.1 mEq/L   Chloride 104  96 - 112 mEq/L   CO2 26  19 - 32 mEq/L   Glucose, Bld 245 (*) 70 - 99 mg/dL   BUN 27 (*) 6 - 23 mg/dL   Creatinine, Ser 1.30 (*) 0.50 - 1.10 mg/dL   Calcium 9.0  8.4 - 86.5 mg/dL   Total Protein 7.4  6.0 - 8.3 g/dL   Albumin 3.8  3.5 - 5.2 g/dL   AST 12  0  - 37 U/L   ALT 9  0 - 35 U/L   Alkaline Phosphatase 59  39 - 117 U/L   Total Bilirubin 1.0  0.3 - 1.2 mg/dL   GFR calc non Af Amer 49 (*) >90 mL/min   GFR calc Af Amer 57 (*) >90 mL/min  PROTIME-INR      Result Value Range   Prothrombin Time 27.5 (*) 11.6 - 15.2 seconds   INR 2.67 (*) 0.00 - 1.49  URINE MICROSCOPIC-ADD ON      Result Value Range   Squamous Epithelial / LPF FEW (*) RARE   WBC, UA 7-10  <3 WBC/hpf   RBC / HPF 0-2  <3 RBC/hpf   Bacteria, UA FEW (*) RARE   Casts GRANULAR CAST (*) NEGATIVE     Shelda Jakes, MD 05/15/13 [phone]

## 2013-05-15 NOTE — ED Provider Notes (Signed)
CSN: [id number]     Arrival date & time 05/15/13  4098 History     First MD Initiated Contact with Patient 05/15/13 [phone]     Chief Complaint  Patient presents with  . Abdominal Pain   (Consider location/radiation/quality/duration/timing/severity/associated sxs/prior Treatment) HPI Comments: Hailey Curry is a 67 y.o. female who presents to the Emergency Department complaining of mid lower abdominal pain for one week.  Describes the pain as aching and "sensitive to the touch".  Pain is not associated with food intake or movement.  She also c/o one episode of dark stool 2 days ago after eating black cherry Jello.  She denies fever, vomiting, constipation or diarrhea.  She also shortness of breath, chest pain or back pain.  She denies burning with urination but does states that she feels like she urinates more frequently.    Patient is a 67 y.o. female presenting with abdominal pain. The history is provided by the patient.  Abdominal Pain Pain location:  Suprapubic Pain quality: aching and fullness   Pain radiates to:  Does not radiate Pain severity:  Mild Onset quality:  Gradual Duration:  1 week Timing:  Constant Progression:  Unchanged Chronicity:  New Context: diet changes   Context: not alcohol use, not awakening from sleep, not previous surgeries, not recent illness and not trauma   Relieved by:  Nothing Worsened by:  Nothing tried Ineffective treatments:  None tried Associated symptoms: melena   Associated symptoms: no anorexia, no belching, no chest pain, no chills, no constipation, no cough, no diarrhea, no dysuria, no fatigue, no fever, no hematuria, no nausea, no shortness of breath, no sore throat, no vaginal bleeding, no vaginal discharge and no vomiting   Risk factors: being elderly and obesity     Past Medical History  Diagnosis Date  . Diabetes mellitus   . Hypertension   . High cholesterol   . Breast cancer 11/2003  . Colonic adenoma   . OSA (obstructive  sleep apnea)     AHI 45.0/hr, REM sleep was 231 mins.sleep was 77% and 84%, RDI was 21.5/hr total sleep was 2h 58 mins  . Hx of echocardiogram 04/28/2012    EF>55%, Moderate to severe mitral annular calcification, mild mitral regurgitation, mild mitral stenosis, mild tricuspid regurgitation, the prosthetic aortic valve is not well visualized, 21 mm St Jude Regent michanical valve there is aortic root sclerosis/calcification. midcavity gradient.  . History of stress test 04/2008    Normal myocardial perfusion scan demonstrating an attenuationartifact in the anterior region of the myocardium. No ischemia or infarct/scar is seen in the remaining myocardium. compared to the previous study, there is no significant change. No significant ischemia demonstrated. this is a low risk study.  . Aortic stenosis   . H/O aortic valve replacement    Past Surgical History  Procedure Laterality Date  . Coronary artery bypass graft    . Breast surgery  12/16/2003  . Aortic valve replacement  08/01/10    By Dr Rexanne Mano, St Jude AVR with 21 mm St Jude regent mechanical valve  . Colonoscopy  03/04/2003    Dr. Jena Gauss- internal hemorrhoids o/w normal rectum, scattered L sided diveritula  . Colonoscopy  04/12/2009    Dr. Frankey Poot polyp, L dided diverticula, tubular adenoma on bx  . Colonoscopy N/A 01/06/2013    RMR; multiple colonic/rectal polyps s/p polypectomy. Fragments of tubular adenomas, needs surveillance Oct 2014  . Colonoscopy N/A 01/18/2013    Procedure: COLONOSCOPY;  Surgeon: Molly Maduro  Sonnie Alamo, MD;  Location: [facility];  Service: Endoscopy;  Laterality: N/A;  with clipping  . Cardiac catheterization  05/2010    Normal coronary arteries angiographically, normal left ventricular functionultrasonographically and severe aortic stenosis by right cath. Optimal therapy would be elective aortic valve replacement   Family History  Problem Relation Age of Onset  . Heart failure Mother   . CAD    .  Diabetes     History  Substance Use Topics  . Smoking status: Never Smoker   . Smokeless tobacco: Never Used  . Alcohol Use: No   OB History   Grav Para Term Preterm Abortions TAB SAB Ect Mult Living                 Review of Systems  Constitutional: Negative for fever, chills, appetite change and fatigue.  HENT: Negative for sore throat and trouble swallowing.   Respiratory: Negative for cough, chest tightness and shortness of breath.   Cardiovascular: Negative for chest pain.  Gastrointestinal: Positive for abdominal pain and melena. Negative for nausea, vomiting, diarrhea, constipation, blood in stool, abdominal distention and anorexia.  Genitourinary: Positive for frequency. Negative for dysuria, hematuria, flank pain, decreased urine volume, vaginal bleeding, vaginal discharge and difficulty urinating.  Musculoskeletal: Negative for back pain.  Skin: Negative for color change and rash.  Neurological: Negative for dizziness, weakness and numbness.  Hematological: Negative for adenopathy.  All other systems reviewed and are negative.    Allergies  Demerol  Home Medications   Current Outpatient Rx  Name  Route  Sig  Dispense  Refill  . citalopram (CELEXA) 40 MG tablet   Oral   Take 1 tablet by mouth every evening.         . cyanocobalamin (,VITAMIN B-12,) 1000 MCG/ML injection   Intramuscular   Inject 1,000 mcg into the muscle every 30 (thirty) days.         Tery Sanfilippo Sodium (COLACE PO)   Oral   Take 1 tablet by mouth 2 (two) times daily.         . fluticasone (FLONASE) 50 MCG/ACT nasal spray   Nasal   Place 2 sprays into the nose daily.         . sitaGLIPtan-metformin (JANUMET) 50-1000 MG per tablet   Oral   Take 1 tablet by mouth 2 (two) times daily with a meal.         . warfarin (COUMADIN) 7.5 MG tablet   Oral   Take 7.5 mg by mouth daily.         . hydrochlorothiazide (MICROZIDE) 12.5 MG capsule   Oral   Take 1 capsule (12.5 mg total)  by mouth daily.   90 capsule   3    BP 138/83  Pulse 84  Temp(Src) 98.1 F (36.7 C) (Oral)  Resp 20  Ht 5\' 3"  (1.6 m)  Wt 253 lb (114.76 kg)  BMI 44.83 kg/m2  SpO2 94% Physical Exam  Nursing note and vitals reviewed. Constitutional: She is oriented to person, place, and time. She appears well-developed and well-nourished. No distress.  HENT:  Head: Normocephalic and atraumatic.  Mouth/Throat: Oropharynx is clear and moist.  Eyes: Conjunctivae and EOM are normal.  Neck: No thyromegaly present.  Cardiovascular: Normal rate, regular rhythm and intact distal pulses.   Murmur heard. Clicking murmur secondary to mechanical heart valve  Pulmonary/Chest: Effort normal and breath sounds normal. No respiratory distress. She has no wheezes. She has no rales.  Abdominal:  Soft. Normal appearance and bowel sounds are normal. She exhibits no distension and no mass. There is no hepatosplenomegaly. There is tenderness in the suprapubic area. There is no rebound, no guarding and no CVA tenderness.    Mild suprapubic tenderness on exam.  No RLQ tenderness, guarding or rebound tenderness.  Remaining abdominal exam is nml.  Genitourinary: Rectum normal. Rectal exam shows no external hemorrhoid, no fissure, no mass, no tenderness and anal tone normal. Guaiac negative stool.  Heme negative , brown stool on digital exam. No palpable rectal masses.   Musculoskeletal: Normal range of motion. She exhibits no edema.  Lymphadenopathy:    She has no cervical adenopathy.  Neurological: She is alert and oriented to person, place, and time.  Skin: Skin is warm and dry.    ED Course   Procedures (including critical care time)  Labs Reviewed  URINALYSIS, ROUTINE W REFLEX MICROSCOPIC - Abnormal; Notable for the following:    APPearance HAZY (*)    Specific Gravity, Urine >1.030 (*)    Hgb urine dipstick SMALL (*)    All other components within normal limits  CBC - Abnormal; Notable for the following:      Hemoglobin 11.1 (*)    HCT 34.1 (*)    Platelets 105 (*)    All other components within normal limits  COMPREHENSIVE METABOLIC PANEL - Abnormal; Notable for the following:    Glucose, Bld 245 (*)    BUN 27 (*)    Creatinine, Ser 1.12 (*)    GFR calc non Af Amer 49 (*)    GFR calc Af Amer 57 (*)    All other components within normal limits  PROTIME-INR - Abnormal; Notable for the following:    Prothrombin Time 27.5 (*)    INR 2.67 (*)    All other components within normal limits  URINE MICROSCOPIC-ADD ON - Abnormal; Notable for the following:    Squamous Epithelial / LPF FEW (*)    Bacteria, UA FEW (*)    Casts GRANULAR CAST (*)    All other components within normal limits  URINE CULTURE     MDM   VSS.  Patient is non-toxic appearing.  Mild suprapubic tenderness on exam with remaining abdominal exam benign.  I have discussed labs with the patient today which appear to be improved from April.  U/A today does indicate probable UTI which would explain patient's suprapubic tenderness, so I will prescribe Keflex and she agrees to fluids and close f/u with her PMD early next week.  She also agrees to return here if her sx's worsen.  Patient also seen by EDP and care plan discussed .  Patient is feeling better and stable for discharge at this time   Sitlali Koerner L. Trisha Mangle, PA-C 05/17/13 1715

## 2013-05-15 NOTE — ED Notes (Signed)
Intermittent mid to lower abd pain x 1 week.  Progressively getting worse.  Noticed black stool x 2 days ago with ? Blood tinged.  Denies GU sx denies n/v/d.

## 2013-05-16 LAB — URINE CULTURE: Colony Count: 2000

## 2013-05-17 ENCOUNTER — Ambulatory Visit: Payer: Medicare Other | Admitting: Pharmacist Clinician (PhC)/ Clinical Pharmacy Specialist

## 2013-05-17 ENCOUNTER — Encounter: Payer: Medicare Other | Admitting: Pharmacist Clinician (PhC)/ Clinical Pharmacy Specialist

## 2013-05-18 NOTE — ED Provider Notes (Signed)
Medical screening examination/treatment/procedure(s) were conducted as a shared visit with non-physician practitioner(s) and myself.  I personally evaluated the patient during the encounter  Shelda Jakes, MD 05/18/13 [phone]

## 2013-05-21 ENCOUNTER — Ambulatory Visit (HOSPITAL_COMMUNITY): Payer: Medicare Other

## 2013-05-28 ENCOUNTER — Ambulatory Visit (HOSPITAL_COMMUNITY)
Admission: RE | Admit: 2013-05-28 | Discharge: 2013-05-28 | Disposition: A | Discharge status: Home / Self Care | Payer: Medicare Other | Source: Ambulatory Visit | Attending: Family Medicine | Admitting: Family Medicine

## 2013-05-28 DIAGNOSIS — Z139 Encounter for screening, unspecified: Secondary | ICD-10-CM

## 2013-05-28 DIAGNOSIS — Z1231 Encounter for screening mammogram for malignant neoplasm of breast: Secondary | ICD-10-CM | POA: Insufficient documentation

## 2013-06-14 ENCOUNTER — Telehealth: Payer: Self-pay | Admitting: Pharmacist Clinician (PhC)/ Clinical Pharmacy Specialist

## 2013-06-14 NOTE — Telephone Encounter (Signed)
Overdue INR letter sent 

## 2013-06-15 ENCOUNTER — Encounter: Payer: Self-pay | Admitting: Internal Medicine

## 2013-06-16 ENCOUNTER — Telehealth: Payer: Self-pay | Admitting: Gastroenterology

## 2013-06-16 ENCOUNTER — Ambulatory Visit (INDEPENDENT_AMBULATORY_CARE_PROVIDER_SITE_OTHER): Payer: Medicare Other | Admitting: Gastroenterology

## 2013-06-16 ENCOUNTER — Encounter: Payer: Self-pay | Admitting: Gastroenterology

## 2013-06-16 VITALS — BP 152/75 | HR 78 | Temp 98.2°F | Ht 63.0 in | Wt 254.8 lb

## 2013-06-16 DIAGNOSIS — R109 Unspecified abdominal pain: Secondary | ICD-10-CM

## 2013-06-16 NOTE — Telephone Encounter (Signed)
I received patient's TSH results from Sept 2013. They were normal.   Please remind patient to take supplemental fiber daily such as Metamucil or Benefiber. I did not include this on the AVS, but I did talk to her about this during clinic. I just wanted to remind her. Thanks!

## 2013-06-16 NOTE — Patient Instructions (Addendum)
Please complete the urinalysis and blood work.   We will see you back in Jan 2015.   I will find out about any health coaches in the area and get back with you.

## 2013-06-16 NOTE — Progress Notes (Signed)
Referring Provider: Corrie Mckusick, MD Primary Care Physician:  Colette Ribas, MD Primary GI: Dr. Jena Gauss   Chief Complaint  Patient presents with  . Follow-up    HPI:   Hailey Curry is a 67 year old pleasant female returning today in follow-up secondary to vague abdominal discomfort.   History of multiple adenomas and one tubulovillous adenoma in 2010, surveillance colonoscopy 2014 with multiple polyps s/p polypectomies, complicated by post-polypectomy bleed in the setting of chronic anticoagulation. She was hospitalized from 4/21 to 4/23 and received 1 unit of PRBCs with repeat colonoscopy s/p bleeding control therapy. She is due around this time now for surveillance due to multiple polyps and to reassess the site of the rectal polyp.  She would like to wait until Jan 2015 to pursue this.   States she is tired all the time. Not on a CPAP machine, although she has one. A month ago, started to feel lower abdominal "electrical charges" occasionally. Given Keflex empirically. Ate some chicken and wild rice, now feels something is not happy in RLQ. No constipation, diarrhea, fever, chills. No rectal bleeding. "Zaps" and then sore. Eases up if goes to bathroom. Trying to avoid seeds. Says what she eats is not that good for her. Feels depressed.   Past Medical History  Diagnosis Date  . Diabetes mellitus   . Hypertension   . High cholesterol   . Breast cancer 11/2003  . Colonic adenoma   . OSA (obstructive sleep apnea)     AHI 45.0/hr, REM sleep was 231 mins.sleep was 77% and 84%, RDI was 21.5/hr total sleep was 2h 58 mins  . Hx of echocardiogram 04/28/2012    EF>55%, Moderate to severe mitral annular calcification, mild mitral regurgitation, mild mitral stenosis, mild tricuspid regurgitation, the prosthetic aortic valve is not well visualized, 21 mm St Jude Regent michanical valve there is aortic root sclerosis/calcification. midcavity gradient.  . History of stress test  04/2008    Normal myocardial perfusion scan demonstrating an attenuationartifact in the anterior region of the myocardium. No ischemia or infarct/scar is seen in the remaining myocardium. compared to the previous study, there is no significant change. No significant ischemia demonstrated. this is a low risk study.  . Aortic stenosis   . H/O aortic valve replacement     Past Surgical History  Procedure Laterality Date  . Coronary artery bypass graft    . Breast surgery  12/16/2003  . Aortic valve replacement  08/01/10    By Dr Rexanne Mano, St Jude AVR with 21 mm St Jude regent mechanical valve  . Colonoscopy  03/04/2003    Dr. Jena Gauss- internal hemorrhoids o/w normal rectum, scattered L sided diveritula  . Colonoscopy  04/12/2009    Dr. Frankey Poot polyp, L dided diverticula, tubular adenoma on bx  . Colonoscopy N/A 01/06/2013    RMR; multiple colonic/rectal polyps s/p polypectomy. Fragments of tubular adenomas, needs surveillance Oct 2014  . Colonoscopy N/A 01/18/2013    YNW:[id number] polypectomy site with bleeding, s/p bleeding control therapy  . Cardiac catheterization  05/2010    Normal coronary arteries angiographically, normal left ventricular functionultrasonographically and severe aortic stenosis by right cath. Optimal therapy would be elective aortic valve replacement    Current Outpatient Prescriptions  Medication Sig Dispense Refill  . citalopram (CELEXA) 40 MG tablet Take 1 tablet by mouth every evening.      . cyanocobalamin (,VITAMIN B-12,) 1000 MCG/ML injection Inject 1,000 mcg into the muscle every 30 (thirty) days.      Marland Kitchen  Docusate Sodium (COLACE PO) Take 1 tablet by mouth 2 (two) times daily.      . fluticasone (FLONASE) 50 MCG/ACT nasal spray Place 2 sprays into the nose daily.      . hydrochlorothiazide (MICROZIDE) 12.5 MG capsule Take 1 capsule (12.5 mg total) by mouth daily.  90 capsule  3  . sitaGLIPtan-metformin (JANUMET) 50-1000 MG per tablet Take 1 tablet by mouth 2  (two) times daily with a meal.      . warfarin (COUMADIN) 7.5 MG tablet Take 7.5 mg by mouth daily.      . cephALEXin (KEFLEX) 500 MG capsule Take 1 capsule (500 mg total) by mouth 4 (four) times daily. For 7 days  28 capsule  0   No current facility-administered medications for this visit.    Allergies as of 06/16/2013 - Review Complete 06/16/2013  Allergen Reaction Noted  . Demerol Other (See Comments) 07/19/2011    Family History  Problem Relation Age of Onset  . Heart failure Mother   . CAD    . Diabetes      History   Social History  . Marital Status: Married    Spouse Name: N/A    Number of Children: N/A  . Years of Education: N/A   Social History Main Topics  . Smoking status: Never Smoker   . Smokeless tobacco: Never Used  . Alcohol Use: No  . Drug Use: No  . Sexual Activity: None   Other Topics Concern  . None   Social History Narrative  . None    Review of Systems: Negative unless mentioned in HPI. Depressed and overwhelmed due to situational life stressors but no evidence of suicidal ideation. Husband present and strong support partner.   Physical Exam: BP 152/75  Pulse 78  Temp(Src) 98.2 F (36.8 C) (Oral)  Ht 5\' 3"  (1.6 m)  Wt 254 lb 12.8 oz (115.577 kg)  BMI 45.15 kg/m2 General:   Alert and oriented. No distress noted. Pleasant and cooperative.  Heart:  S1, S2 present without murmurs, rubs, or gallops. Regular rate and rhythm. Abdomen:  +BS, soft, non-tender and non-distended. No rebound or guarding. No HSM or masses noted. Obese.  Msk:  Symmetrical without gross deformities. Normal posture. Extremities:  Without edema. Neurologic:  Alert and  oriented x4;  grossly normal neurologically. Skin:  Intact without significant lesions or rashes. Psych:  Alert and cooperative. Normal mood and affect.   Sept 2013 TSH 1.677

## 2013-06-16 NOTE — Assessment & Plan Note (Signed)
67 year old female well-known to our practice with a history of adenomatous polyps, presenting with vague abdominal discomfort that appears to be relieved with defecation. No fever, chills, rectal bleeding, or concerning signs. Doubt dealing with acute etiology. Admits to eating foods that are "not good for her".   Add supplemental fiber to diet to help with bowel regimen Consider CT if worsening/progression of discomfort.  Patient would like to wait until Jan 2015 to discuss surveillance colonoscopy I also discussed seeing a health coach for assistance in her overall wellness. She is excited to hear about this and would like to pursue this. Will research some possibilities for her and let her know.   Otherwise, return in Jan 2015 to discuss colonoscopy. Patient's case somewhat challenging due to prior post-polypectomy bleed in the setting of anticoagulation.

## 2013-06-17 LAB — URINALYSIS W MICROSCOPIC + REFLEX CULTURE
Casts: NONE SEEN
Crystals: NONE SEEN
Glucose, UA: NEGATIVE mg/dL
Nitrite: NEGATIVE
Specific Gravity, Urine: 1.014 (ref 1.005–1.030)
Squamous Epithelial / LPF: NONE SEEN
pH: 5 (ref 5.0–8.0)

## 2013-06-17 NOTE — Progress Notes (Signed)
CC'd to PCP 

## 2013-06-17 NOTE — Telephone Encounter (Signed)
Tried to call pt- NA 

## 2013-06-20 LAB — URINE CULTURE

## 2013-06-21 ENCOUNTER — Other Ambulatory Visit: Payer: Self-pay | Admitting: Gastroenterology

## 2013-06-21 MED ORDER — SULFAMETHOXAZOLE-TMP DS 800-160 MG PO TABS: 1.0000 | ORAL_TABLET | Freq: Two times a day (BID) | ORAL | Status: DC

## 2013-06-21 NOTE — Progress Notes (Signed)
Quick Note:  CORRECTION: I sent in Bactrim DS 1 tab po BID X 3 days.  SHE IS ON COUMADIN: taking abx can alter the affects of Coumadin significantly. Tell her DO NOT TAKE ABX without letting her cardiologist know about this. They may want to decrease Coumadin just for those 3 days. ______

## 2013-06-21 NOTE — Telephone Encounter (Signed)
Pt is aware.  

## 2013-06-23 ENCOUNTER — Telehealth: Payer: Self-pay | Admitting: Gastroenterology

## 2013-06-23 ENCOUNTER — Telehealth: Payer: Self-pay | Admitting: Cardiovascular Disease

## 2013-06-23 ENCOUNTER — Ambulatory Visit (INDEPENDENT_AMBULATORY_CARE_PROVIDER_SITE_OTHER): Payer: Medicare Other | Admitting: Pharmacist Clinician (PhC)/ Clinical Pharmacy Specialist

## 2013-06-23 DIAGNOSIS — Z7901 Long term (current) use of anticoagulants: Secondary | ICD-10-CM

## 2013-06-23 DIAGNOSIS — Z954 Presence of other heart-valve replacement: Secondary | ICD-10-CM

## 2013-06-23 DIAGNOSIS — Z952 Presence of prosthetic heart valve: Secondary | ICD-10-CM

## 2013-06-23 NOTE — Telephone Encounter (Signed)
Responding to your letter

## 2013-06-23 NOTE — Telephone Encounter (Signed)
See office visit dated 9/17. Patient desires to look into appointments with a health coach to help her in overall wellness. Gearlean Alf, assistant professor at the [place] [facility] at [place] has agreed to discuss health coaching with patient. I contacted patient's husband and asked her to return our call when able.   Best way to contact Gearlean Alf: [email or web address]    Credentials: Roger Shelter. Samuel Bouche, RN, MPH, CHES AP Assistant Professor Certified [employer] and [employer] secretary of [employer] [place] [facility] [place]

## 2013-06-23 NOTE — Telephone Encounter (Signed)
Informed patient

## 2013-06-24 LAB — TSH: TSH: 1.68 u[IU]/mL (ref 0.41–5.90)

## 2013-07-19 ENCOUNTER — Other Ambulatory Visit (HOSPITAL_COMMUNITY): Payer: Self-pay | Admitting: Family Medicine

## 2013-07-19 ENCOUNTER — Ambulatory Visit (HOSPITAL_COMMUNITY)
Admission: RE | Admit: 2013-07-19 | Discharge: 2013-07-19 | Disposition: A | Discharge status: Home / Self Care | Payer: Medicare Other | Source: Ambulatory Visit | Attending: Family Medicine | Admitting: Family Medicine

## 2013-07-19 DIAGNOSIS — R05 Cough: Secondary | ICD-10-CM

## 2013-07-19 DIAGNOSIS — R059 Cough, unspecified: Secondary | ICD-10-CM | POA: Insufficient documentation

## 2013-07-21 ENCOUNTER — Ambulatory Visit (INDEPENDENT_AMBULATORY_CARE_PROVIDER_SITE_OTHER): Payer: Medicare Other | Admitting: Pharmacist Clinician (PhC)/ Clinical Pharmacy Specialist

## 2013-07-21 VITALS — BP 146/70 | HR 80

## 2013-07-21 DIAGNOSIS — Z952 Presence of prosthetic heart valve: Secondary | ICD-10-CM

## 2013-07-21 DIAGNOSIS — Z954 Presence of other heart-valve replacement: Secondary | ICD-10-CM

## 2013-07-21 DIAGNOSIS — Z7901 Long term (current) use of anticoagulants: Secondary | ICD-10-CM

## 2013-07-21 LAB — POCT INR: INR: 4.9

## 2013-08-05 ENCOUNTER — Other Ambulatory Visit: Payer: Self-pay

## 2013-08-13 ENCOUNTER — Telehealth: Payer: Self-pay | Admitting: Pharmacist Clinician (PhC)/ Clinical Pharmacy Specialist

## 2013-08-13 DIAGNOSIS — Z952 Presence of prosthetic heart valve: Secondary | ICD-10-CM

## 2013-08-13 DIAGNOSIS — Z7901 Long term (current) use of anticoagulants: Secondary | ICD-10-CM

## 2013-08-13 NOTE — Telephone Encounter (Signed)
Pt called, needs lab order for [facility] - order mailed to pt 08/13/13

## 2013-08-30 ENCOUNTER — Other Ambulatory Visit: Payer: Self-pay | Admitting: Cardiovascular Disease

## 2013-08-31 ENCOUNTER — Ambulatory Visit (INDEPENDENT_AMBULATORY_CARE_PROVIDER_SITE_OTHER): Payer: Medicare Other | Admitting: Pharmacist Clinician (PhC)/ Clinical Pharmacy Specialist

## 2013-08-31 DIAGNOSIS — Z952 Presence of prosthetic heart valve: Secondary | ICD-10-CM

## 2013-08-31 DIAGNOSIS — Z954 Presence of other heart-valve replacement: Secondary | ICD-10-CM

## 2013-08-31 DIAGNOSIS — Z7901 Long term (current) use of anticoagulants: Secondary | ICD-10-CM

## 2013-08-31 LAB — PROTIME-INR
INR: 2.37 — ABNORMAL HIGH (ref <1.50)
Prothrombin Time: 25 seconds — ABNORMAL HIGH (ref 11.6–15.2)

## 2013-09-21 ENCOUNTER — Telehealth: Payer: Self-pay | Admitting: Cardiovascular Disease

## 2013-09-21 NOTE — Telephone Encounter (Signed)
Has been trying to get [facility] in [employer] set up to send results of inr check to [place].  Please call

## 2013-09-21 NOTE — Telephone Encounter (Signed)
Forward to Kristin 

## 2013-09-21 NOTE — Telephone Encounter (Signed)
Spoke with patient, will mail copy of standing order to her to give to [facility].  If that is not acceptable to them, I included a note with our fax # for them to send a request to [place] for standing INR order.

## 2013-09-27 ENCOUNTER — Telehealth: Payer: Self-pay | Admitting: Cardiovascular Disease

## 2013-09-27 NOTE — Telephone Encounter (Signed)
York Spaniel Tomi was supposed to get a lab order for INR ck   Was to be mailed still has not received.  Please call

## 2013-09-28 NOTE — Telephone Encounter (Signed)
Pt received lab order in mail today.

## 2013-10-01 ENCOUNTER — Other Ambulatory Visit: Payer: Self-pay | Admitting: Cardiovascular Disease

## 2013-10-02 LAB — PROTIME-INR
INR: 2.5 — AB (ref <1.50)
Prothrombin Time: 26.4 seconds — ABNORMAL HIGH (ref 11.6–15.2)

## 2013-10-05 ENCOUNTER — Ambulatory Visit (INDEPENDENT_AMBULATORY_CARE_PROVIDER_SITE_OTHER): Payer: Medicare Other | Admitting: Pharmacist Clinician (PhC)/ Clinical Pharmacy Specialist

## 2013-10-05 DIAGNOSIS — Z952 Presence of prosthetic heart valve: Secondary | ICD-10-CM

## 2013-10-05 DIAGNOSIS — Z7901 Long term (current) use of anticoagulants: Secondary | ICD-10-CM

## 2013-10-05 DIAGNOSIS — Z954 Presence of other heart-valve replacement: Secondary | ICD-10-CM

## 2013-10-09 ENCOUNTER — Other Ambulatory Visit: Payer: Self-pay | Admitting: Cardiovascular Disease

## 2013-10-11 ENCOUNTER — Encounter: Payer: Self-pay | Admitting: Cardiovascular Disease

## 2013-10-21 ENCOUNTER — Encounter: Payer: Self-pay | Admitting: Gastroenterology

## 2013-10-21 ENCOUNTER — Ambulatory Visit (INDEPENDENT_AMBULATORY_CARE_PROVIDER_SITE_OTHER): Payer: Medicare Other | Admitting: Gastroenterology

## 2013-10-21 VITALS — BP 171/57 | HR 93 | Temp 97.6°F | Wt 270.0 lb

## 2013-10-21 DIAGNOSIS — R109 Unspecified abdominal pain: Secondary | ICD-10-CM

## 2013-10-21 MED ORDER — DEXLANSOPRAZOLE 60 MG PO CPDR: 60.0000 mg | DELAYED_RELEASE_CAPSULE | Freq: Every day | ORAL | Status: DC

## 2013-10-21 NOTE — Patient Instructions (Addendum)
Stop Protonix. Start Dexilant daily. I have sent a prescription and provided samples.  We have set you up for a CT scan of your belly to further look at the bloodflow in your stomach.  I would like to schedule you for a colonoscopy in the near future, after we make sure everything is normal on the CT. Please review the reflux diet and follow strictly for now, as this could be causing some of your symptoms.    Diet for Gastroesophageal Reflux Disease, Adult Reflux (acid reflux) is when acid from your stomach flows up into the esophagus. When acid comes in contact with the esophagus, the acid causes irritation and soreness (inflammation) in the esophagus. When reflux happens often or so severely that it causes damage to the esophagus, it is called gastroesophageal reflux disease (GERD). Nutrition therapy can help ease the discomfort of GERD. FOODS OR DRINKS TO AVOID OR LIMIT  Smoking or chewing tobacco. Nicotine is one of the most potent stimulants to acid production in the gastrointestinal tract.  Caffeinated and decaffeinated coffee and black tea.  Regular or low-calorie carbonated beverages or energy drinks (caffeine-free carbonated beverages are allowed).   Strong spices, such as black pepper, white pepper, red pepper, cayenne, curry powder, and chili powder.  Peppermint or spearmint.  Chocolate.  High-fat foods, including meats and fried foods. Extra added fats including oils, butter, salad dressings, and nuts. Limit these to less than 8 tsp per day.  Fruits and vegetables if they are not tolerated, such as citrus fruits or tomatoes.  Alcohol.  Any food that seems to aggravate your condition. If you have questions regarding your diet, call your caregiver or a registered dietitian. OTHER THINGS THAT MAY HELP GERD INCLUDE:   Eating your meals slowly, in a relaxed setting.  Eating 5 to 6 small meals per day instead of 3 large meals.  Eliminating food for a period of time if it  causes distress.  Not lying down until 3 hours after eating a meal.  Keeping the head of your bed raised 6 to 9 inches (15 to 23 cm) by using a foam wedge or blocks under the legs of the bed. Lying flat may make symptoms worse.  Being physically active. Weight loss may be helpful in reducing reflux in overweight or obese adults.  Wear loose fitting clothing EXAMPLE MEAL PLAN This meal plan is approximately 2,000 calories based on [email or web address] meal planning guidelines. Breakfast   cup cooked oatmeal.  1 cup strawberries.  1 cup low-fat milk.  1 oz almonds. Snack  1 cup cucumber slices.  6 oz yogurt (made from low-fat or fat-free milk). Lunch  2 slice whole-wheat bread.  2 oz sliced [place].  2 tsp mayonnaise.  1 cup blueberries.  1 cup snap peas. Snack  6 whole-wheat crackers.  1 oz string cheese. Dinner   cup brown rice.  1 cup mixed veggies.  1 tsp olive oil.  3 oz grilled fish. Document Released: 09/16/2005 Document Revised: 12/09/2011 Document Reviewed: 08/02/2011 [facility] Patient Information 2014 [place], [place].

## 2013-10-21 NOTE — Progress Notes (Signed)
Referring Provider: Halford Chessman, MD Primary Care Physician:  Purvis Kilts, MD Primary GI: Dr. Gala Romney   Chief Complaint  Patient presents with  . Abdominal Pain    HPI:   Hailey Curry presents today in follow-up for worsening abdominal pain. She has a history of multiple adenomas and one tubulovillous adenoma in 2010, surveillance colonoscopy 2014 with multiple polyps s/p polypectomies, complicated by post-polypectomy bleed in the setting of chronic anticoagulation. She was hospitalized from 4/21 to 4/23 and received 1 unit of PRBCs with repeat colonoscopy s/p bleeding control therapy. She is due around this time now for surveillance due to multiple polyps and to reassess the site of the rectal polyp. In September 2014, I saw Hailey Curry, and she had elected to wait until Jan 2015 to discuss this again.   Notes abdominal pain worse after eating. Abdominal pain located between epigastric region and lower abdomen.Worse in periumbilical region.  Feels bloating and swollen. Last EGD at least 30 years ago. Feels like stinging pain. Yesterday felt like she could have had an aneurysm. Last 3 weeks worsening. No melena. Mild nausea, thinks related to sinus drainage. She tells me a CT scan is ordered for Monday by Dr. [name]. Has taken Prilosec and Protonix in the past. Spicy foods. Lots of oranges. [employer]. No constipation. No rectal bleeding.   Past Medical History  Diagnosis Date  . Diabetes mellitus   . Hypertension   . High cholesterol   . Breast cancer 11/2003  . Colonic adenoma   . OSA (obstructive sleep apnea)     AHI 45.0/hr, REM sleep was 231 mins.sleep was 77% and 84%, RDI was 21.5/hr total sleep was 2h 58 mins  . Hx of echocardiogram 04/28/2012    EF>55%, Moderate to severe mitral annular calcification, mild mitral regurgitation, mild mitral stenosis, mild tricuspid regurgitation, the prosthetic aortic valve is not well visualized, 21 mm St Jude Regent michanical  valve there is aortic root sclerosis/calcification. 18mmhg midcavity gradient.  . History of stress test 04/2008    Normal myocardial perfusion scan demonstrating an attenuationartifact in the anterior region of the myocardium. No ischemia or infarct/scar is seen in the remaining myocardium. compared to the previous study, there is no significant change. No significant ischemia demonstrated. this is a low risk study.  . Aortic stenosis   . H/O aortic valve replacement     Past Surgical History  Procedure Laterality Date  . Coronary artery bypass graft    . Breast surgery  12/16/2003  . Aortic valve replacement  08/01/10    By Dr Arvid Right, St Jude AVR with 21 mm St Jude regent mechanical valve  . Colonoscopy  03/04/2003    Dr. Gala Romney- internal hemorrhoids o/w normal rectum, scattered L sided diveritula  . Colonoscopy  04/12/2009    Dr. Imogene Burn polyp, L dided diverticula, tubular adenoma on bx  . Colonoscopy N/A 01/06/2013    RMR; multiple colonic/rectal polyps s/p polypectomy. Fragments of tubular adenomas, needs surveillance Oct 2014  . Colonoscopy N/A 01/18/2013    Y[facility]:[id number] polypectomy site with bleeding, s/p bleeding control therapy  . Cardiac catheterization  05/2010    Normal coronary arteries angiographically, normal left ventricular functionultrasonographically and severe aortic stenosis by right cath. Optimal therapy would be elective aortic valve replacement    Current Outpatient Prescriptions  Medication Sig Dispense Refill  . citalopram (CELEXA) 40 MG tablet Take 1 tablet by mouth every evening.      . cyanocobalamin (,VITAMIN  B-12,) 1000 MCG/ML injection Inject 1,000 mcg into the muscle every 30 (thirty) days.      Mariane Baumgarten Sodium (COLACE PO) Take 1 tablet by mouth 2 (two) times daily.      Marland Kitchen FIBER SELECT GUMMIES CHEW Chew 1 tablet by mouth 2 (two) times daily.      . fluticasone (FLONASE) 50 MCG/ACT nasal spray Place 2 sprays into the nose daily.      . insulin  aspart protamine- aspart (NOVOLOG MIX 70/30) (70-30) 100 UNIT/ML injection Inject 20-30 Units into the skin 2 (two) times daily with a meal.      . Loratadine 10 MG CAPS Take 10 mg by mouth daily as needed.      . metFORMIN (GLUCOPHAGE) 500 MG tablet Take 500 mg by mouth 2 (two) times daily with a meal.      . pantoprazole (PROTONIX) 40 MG tablet Take 40 mg by mouth daily as needed.      . warfarin (COUMADIN) 5 MG tablet TAKE 1 TO 1.5 TABLETS BY MOUTH DAILY  40 tablet  4  . olmesartan-hydrochlorothiazide (BENICAR HCT) 20-12.5 MG per tablet Take 1 tablet by mouth daily.       No current facility-administered medications for this visit.    Allergies as of 10/21/2013 - Review Complete 10/21/2013  Allergen Reaction Noted  . Demerol Other (See Comments) 07/19/2011    Family History  Problem Relation Age of Onset  . Heart failure Mother   . CAD    . Diabetes      History   Social History  . Marital Status: Married    Spouse Name: N/A    Number of Children: N/A  . Years of Education: N/A   Social History Main Topics  . Smoking status: Never Smoker   . Smokeless tobacco: Never Used  . Alcohol Use: No  . Drug Use: No  . Sexual Activity: None   Other Topics Concern  . None   Social History Narrative  . None    Review of Systems: As mentioned in HPI.   Physical Exam: BP 171/57  Pulse 93  Temp(Src) 97.6 F (36.4 C) (Oral)  Wt 270 lb (122.471 kg) General:   Alert and oriented. No distress noted. Pleasant and cooperative.  Head:  Normocephalic and atraumatic. Eyes:  Conjuctiva clear without scleral icterus. Mouth:  Oral mucosa pink and moist. Good dentition. No lesions. Heart:  S1, S2 present with prosthetic heart sounds.  Abdomen:  +BS, soft, TTP periumbilically and non-distended. No rebound or guarding. No HSM or masses noted. Msk:  Symmetrical without gross deformities. Normal posture. Extremities:  Without edema. Neurologic:  Alert and  oriented x4;  grossly normal  neurologically. Skin:  Intact without significant lesions or rashes. Psych:  Alert and cooperative. Normal mood and affect.

## 2013-10-22 ENCOUNTER — Other Ambulatory Visit: Payer: Self-pay | Admitting: Gastroenterology

## 2013-10-22 DIAGNOSIS — R109 Unspecified abdominal pain: Secondary | ICD-10-CM

## 2013-10-23 DIAGNOSIS — R109 Unspecified abdominal pain: Secondary | ICD-10-CM | POA: Insufficient documentation

## 2013-10-23 NOTE — Assessment & Plan Note (Signed)
68 year old female with new-onset abdominal pain, worse with eating, associated with bloating. Worse around periumbilical region. Last EGD in remote past. With her multiple comorbidities, I would like to rule out mesenteric ischemia before further proceeding with an invasive procedure. Upon further discussion with patient, she notes multiple foods that are worsening GERD symptoms and could possibly play a small role in her presentation. However, I discussed with her that I agree with Dr. Delanna Ahmadi decision to proceed with a CT in near future first. I attempted to call Dr. Delanna Ahmadi office to discuss changing the CT to CT angiogram, but the office was closed. As order was not on file yet at radiology, we have ordered a CT angiogram, which will allow further investigation of mesenteric vasculature. In the interim, stop Protonix and start Dexilant. Samples provided. Ultimately, needs colonoscopy for surveillance in near future; she may need an EGD if negative CT and persistent symptoms despite changing diet and PPI. Further recommendations to follow.

## 2013-10-25 ENCOUNTER — Telehealth: Payer: Self-pay

## 2013-10-25 ENCOUNTER — Other Ambulatory Visit (HOSPITAL_COMMUNITY): Payer: Medicare Other

## 2013-10-25 NOTE — Telephone Encounter (Signed)
Pt called this morning to lets [place] know she had blood work done at [facility] on 10/21/13 and they did a Creatine and it was 1.22.

## 2013-10-25 NOTE — Telephone Encounter (Signed)
Great. Please let radiology know. We need to get this result and scan into computer so they can have for their files.

## 2013-10-25 NOTE — Telephone Encounter (Signed)
Anderson [place] in [place] is aware of patients Creatine.

## 2013-10-27 ENCOUNTER — Ambulatory Visit (HOSPITAL_COMMUNITY): Admission: RE | Admit: 2013-10-27 | Payer: Medicare Other | Source: Ambulatory Visit

## 2013-10-29 ENCOUNTER — Ambulatory Visit (HOSPITAL_COMMUNITY)
Admission: RE | Admit: 2013-10-29 | Discharge: 2013-10-29 | Disposition: A | Discharge status: Home / Self Care | Payer: Medicare Other | Source: Ambulatory Visit | Attending: Gastroenterology | Admitting: Gastroenterology

## 2013-10-29 DIAGNOSIS — R599 Enlarged lymph nodes, unspecified: Secondary | ICD-10-CM | POA: Insufficient documentation

## 2013-10-29 DIAGNOSIS — R109 Unspecified abdominal pain: Secondary | ICD-10-CM | POA: Insufficient documentation

## 2013-10-29 DIAGNOSIS — N289 Disorder of kidney and ureter, unspecified: Secondary | ICD-10-CM | POA: Insufficient documentation

## 2013-10-29 DIAGNOSIS — R1909 Other intra-abdominal and pelvic swelling, mass and lump: Secondary | ICD-10-CM | POA: Insufficient documentation

## 2013-10-29 MED ORDER — IOHEXOL 350 MG/ML SOLN
100.0000 mL | Freq: Once | INTRAVENOUS | Status: AC | PRN
  Administered 2013-10-29: 100 mL via INTRAVENOUS

## 2013-11-02 NOTE — Progress Notes (Signed)
Quick Note:  No evidence of mesenteric ischemia, which is good.  However, there is a complex cyst in left kidney. Recommend MRI with contrast.  She has a history of an aortic valve replacement: we need to check with MRI to make sure this is ok prior to scheduling. I'm sure it is, but we need to be definite.  How is abdominal pain?   ______

## 2013-11-03 ENCOUNTER — Telehealth: Payer: Self-pay | Admitting: Gastroenterology

## 2013-11-03 MED ORDER — OMEPRAZOLE 20 MG PO CPDR: 20.0000 mg | DELAYED_RELEASE_CAPSULE | Freq: Every day | ORAL | Status: DC

## 2013-11-03 NOTE — Telephone Encounter (Signed)
Trial Omeprazole. I sent to pharmacy.  OV here in about 2-4 weeks.

## 2013-11-03 NOTE — Telephone Encounter (Signed)
Susan, please schedule ov 

## 2013-11-03 NOTE — Telephone Encounter (Signed)
Pt called and is aware. She wants to call back to schedule her appt, she has some other appts also.

## 2013-11-03 NOTE — Telephone Encounter (Signed)
Message copied by Orvil Feil on Wed Nov 03, 2013  2:17 PM ------      Message from: Claudina Lick      Created: Tue Nov 02, 2013  5:08 PM       Pt is going to urologist next week and would like to wait and see what they recommend before she has this done.       Also she continues to have the abd pain and swelling. She said she has the abd pain all the time but the severity of the pain is different all day. No N/V, no fever, she has cut out spicy foods and cut back on the soft drinks. She didn't try the dexilant samples because the pharmacy told her it would be $95 a month to have it filled. She is currently taking protonix but doesn't feel like it helps as much as it used to. She cant remember if she ever tried any other PPI's or not but would like for you to send in an rx for a generic she can try to see if it works better than the protonix. ------

## 2013-11-04 ENCOUNTER — Ambulatory Visit (INDEPENDENT_AMBULATORY_CARE_PROVIDER_SITE_OTHER): Payer: Medicare Other | Admitting: Pharmacist Clinician (PhC)/ Clinical Pharmacy Specialist

## 2013-11-04 ENCOUNTER — Encounter: Payer: Self-pay | Admitting: Gastroenterology

## 2013-11-04 VITALS — BP 144/64 | HR 84

## 2013-11-04 DIAGNOSIS — Z954 Presence of other heart-valve replacement: Secondary | ICD-10-CM

## 2013-11-04 DIAGNOSIS — Z952 Presence of prosthetic heart valve: Secondary | ICD-10-CM

## 2013-11-04 DIAGNOSIS — Z7901 Long term (current) use of anticoagulants: Secondary | ICD-10-CM

## 2013-11-04 LAB — POCT INR: INR: 5.8

## 2013-11-04 NOTE — Telephone Encounter (Signed)
Pt is aware of OV on 3/5 at 0800 with [facility] and appt card was mailed

## 2013-11-09 ENCOUNTER — Ambulatory Visit (INDEPENDENT_AMBULATORY_CARE_PROVIDER_SITE_OTHER): Payer: Medicare Other | Admitting: Urology

## 2013-11-09 DIAGNOSIS — N281 Cyst of kidney, acquired: Secondary | ICD-10-CM

## 2013-11-09 DIAGNOSIS — R3129 Other microscopic hematuria: Secondary | ICD-10-CM

## 2013-11-09 DIAGNOSIS — R599 Enlarged lymph nodes, unspecified: Secondary | ICD-10-CM

## 2013-12-02 ENCOUNTER — Ambulatory Visit: Payer: Medicare Other | Admitting: Gastroenterology

## 2013-12-03 ENCOUNTER — Other Ambulatory Visit: Payer: Self-pay | Admitting: Cardiovascular Disease

## 2013-12-04 LAB — PROTIME-INR
INR: 2.47 — AB (ref <1.50)
Prothrombin Time: 26.1 seconds — ABNORMAL HIGH (ref 11.6–15.2)

## 2013-12-06 ENCOUNTER — Ambulatory Visit (INDEPENDENT_AMBULATORY_CARE_PROVIDER_SITE_OTHER): Payer: Medicare Other | Admitting: Pharmacist Clinician (PhC)/ Clinical Pharmacy Specialist

## 2013-12-06 DIAGNOSIS — Z954 Presence of other heart-valve replacement: Secondary | ICD-10-CM

## 2013-12-06 DIAGNOSIS — Z7901 Long term (current) use of anticoagulants: Secondary | ICD-10-CM

## 2013-12-06 DIAGNOSIS — Z952 Presence of prosthetic heart valve: Secondary | ICD-10-CM

## 2013-12-20 ENCOUNTER — Encounter (HOSPITAL_COMMUNITY): Payer: Self-pay | Admitting: Emergency Medicine

## 2013-12-20 ENCOUNTER — Emergency Department (HOSPITAL_COMMUNITY): Payer: Medicare Other

## 2013-12-20 ENCOUNTER — Inpatient Hospital Stay (HOSPITAL_COMMUNITY)
Admission: EM | Admit: 2013-12-20 | Discharge: 2013-12-22 | DRG: 086 | Disposition: A | Discharge status: Home / Self Care | Payer: Medicare Other | Attending: General Surgery | Admitting: General Surgery

## 2013-12-20 DIAGNOSIS — S1093XA Contusion of unspecified part of neck, initial encounter: Secondary | ICD-10-CM

## 2013-12-20 DIAGNOSIS — S0083XA Contusion of other part of head, initial encounter: Secondary | ICD-10-CM

## 2013-12-20 DIAGNOSIS — Z853 Personal history of malignant neoplasm of breast: Secondary | ICD-10-CM

## 2013-12-20 DIAGNOSIS — Z79899 Other long term (current) drug therapy: Secondary | ICD-10-CM

## 2013-12-20 DIAGNOSIS — S0100XA Unspecified open wound of scalp, initial encounter: Secondary | ICD-10-CM | POA: Diagnosis present

## 2013-12-20 DIAGNOSIS — D649 Anemia, unspecified: Secondary | ICD-10-CM | POA: Diagnosis present

## 2013-12-20 DIAGNOSIS — I4510 Unspecified right bundle-branch block: Secondary | ICD-10-CM | POA: Diagnosis present

## 2013-12-20 DIAGNOSIS — E119 Type 2 diabetes mellitus without complications: Secondary | ICD-10-CM | POA: Diagnosis present

## 2013-12-20 DIAGNOSIS — S066XAA Traumatic subarachnoid hemorrhage with loss of consciousness status unknown, initial encounter: Secondary | ICD-10-CM

## 2013-12-20 DIAGNOSIS — S0101XA Laceration without foreign body of scalp, initial encounter: Secondary | ICD-10-CM

## 2013-12-20 DIAGNOSIS — Z951 Presence of aortocoronary bypass graft: Secondary | ICD-10-CM

## 2013-12-20 DIAGNOSIS — Z952 Presence of prosthetic heart valve: Secondary | ICD-10-CM

## 2013-12-20 DIAGNOSIS — E7800 Pure hypercholesterolemia, unspecified: Secondary | ICD-10-CM | POA: Diagnosis present

## 2013-12-20 DIAGNOSIS — S0003XA Contusion of scalp, initial encounter: Secondary | ICD-10-CM | POA: Diagnosis present

## 2013-12-20 DIAGNOSIS — D62 Acute posthemorrhagic anemia: Secondary | ICD-10-CM | POA: Diagnosis present

## 2013-12-20 DIAGNOSIS — S066X9A Traumatic subarachnoid hemorrhage with loss of consciousness of unspecified duration, initial encounter: Principal | ICD-10-CM | POA: Diagnosis present

## 2013-12-20 DIAGNOSIS — Z833 Family history of diabetes mellitus: Secondary | ICD-10-CM

## 2013-12-20 DIAGNOSIS — W010XXA Fall on same level from slipping, tripping and stumbling without subsequent striking against object, initial encounter: Secondary | ICD-10-CM | POA: Diagnosis present

## 2013-12-20 DIAGNOSIS — W19XXXA Unspecified fall, initial encounter: Secondary | ICD-10-CM | POA: Diagnosis present

## 2013-12-20 DIAGNOSIS — R55 Syncope and collapse: Secondary | ICD-10-CM | POA: Diagnosis present

## 2013-12-20 DIAGNOSIS — Y92009 Unspecified place in unspecified non-institutional (private) residence as the place of occurrence of the external cause: Secondary | ICD-10-CM

## 2013-12-20 DIAGNOSIS — D696 Thrombocytopenia, unspecified: Secondary | ICD-10-CM | POA: Diagnosis present

## 2013-12-20 DIAGNOSIS — I10 Essential (primary) hypertension: Secondary | ICD-10-CM | POA: Diagnosis present

## 2013-12-20 DIAGNOSIS — Z794 Long term (current) use of insulin: Secondary | ICD-10-CM

## 2013-12-20 DIAGNOSIS — Z7901 Long term (current) use of anticoagulants: Secondary | ICD-10-CM

## 2013-12-20 DIAGNOSIS — G4733 Obstructive sleep apnea (adult) (pediatric): Secondary | ICD-10-CM | POA: Diagnosis present

## 2013-12-20 DIAGNOSIS — I2510 Atherosclerotic heart disease of native coronary artery without angina pectoris: Secondary | ICD-10-CM | POA: Diagnosis present

## 2013-12-20 DIAGNOSIS — Z8249 Family history of ischemic heart disease and other diseases of the circulatory system: Secondary | ICD-10-CM

## 2013-12-20 DIAGNOSIS — Z954 Presence of other heart-valve replacement: Secondary | ICD-10-CM

## 2013-12-20 LAB — CBC WITH DIFFERENTIAL/PLATELET
Basophils Absolute: 0 10*3/uL (ref 0.0–0.1)
Basophils Relative: 0 % (ref 0–1)
Eosinophils Absolute: 0.1 10*3/uL (ref 0.0–0.7)
Eosinophils Relative: 1 % (ref 0–5)
HCT: 36.1 % (ref 36.0–46.0)
Hemoglobin: 11.7 g/dL — ABNORMAL LOW (ref 12.0–15.0)
Lymphocytes Relative: 20 % (ref 12–46)
Lymphs Abs: 2.2 10*3/uL (ref 0.7–4.0)
MCH: 25.3 pg — ABNORMAL LOW (ref 26.0–34.0)
MCHC: 32.4 g/dL (ref 30.0–36.0)
MCV: 78 fL (ref 78.0–100.0)
Monocytes Absolute: 1.1 10*3/uL — ABNORMAL HIGH (ref 0.1–1.0)
Monocytes Relative: 10 % (ref 3–12)
Neutro Abs: 7.6 10*3/uL (ref 1.7–7.7)
Neutrophils Relative %: 69 % (ref 43–77)
Platelets: 95 10*3/uL — ABNORMAL LOW (ref 150–400)
RBC: 4.63 MIL/uL (ref 3.87–5.11)
RDW: 14 % (ref 11.5–15.5)
WBC: 11 10*3/uL — ABNORMAL HIGH (ref 4.0–10.5)

## 2013-12-20 LAB — BASIC METABOLIC PANEL
BUN: 29 mg/dL — ABNORMAL HIGH (ref 6–23)
CO2: 27 mEq/L (ref 19–32)
Calcium: 9.1 mg/dL (ref 8.4–10.5)
Chloride: 102 mEq/L (ref 96–112)
Creatinine, Ser: 1.26 mg/dL — ABNORMAL HIGH (ref 0.50–1.10)
GFR calc Af Amer: 49 mL/min — ABNORMAL LOW (ref >90)
GFR calc non Af Amer: 42 mL/min — ABNORMAL LOW (ref >90)
Glucose, Bld: 206 mg/dL — ABNORMAL HIGH (ref 70–99)
Potassium: 4.3 mEq/L (ref 3.7–5.3)
Sodium: 139 mEq/L (ref 137–147)

## 2013-12-20 LAB — CBG MONITORING, ED
GLUCOSE-CAPILLARY: 152 mg/dL — AB (ref 70–99)
Glucose-Capillary: 242 mg/dL — ABNORMAL HIGH (ref 70–99)

## 2013-12-20 LAB — PROTIME-INR
INR: 3.62 — ABNORMAL HIGH (ref 0.00–1.49)
Prothrombin Time: 34.7 seconds — ABNORMAL HIGH (ref 11.6–15.2)

## 2013-12-20 MED ORDER — ONDANSETRON HCL 4 MG/2ML IJ SOLN
INTRAMUSCULAR | Status: AC
  Filled 2013-12-20: qty 2

## 2013-12-20 MED ORDER — ONDANSETRON HCL 4 MG/2ML IJ SOLN
4.0000 mg | Freq: Once | INTRAMUSCULAR | Status: AC
  Administered 2013-12-20: 4 mg via INTRAVENOUS
  Filled 2013-12-20: qty 2

## 2013-12-20 MED ORDER — VITAMIN K1 10 MG/ML IJ SOLN
10.0000 mg | Freq: Once | INTRAVENOUS | Status: DC
  Filled 2013-12-20: qty 1

## 2013-12-20 MED ORDER — VITAMIN K1 10 MG/ML IJ SOLN
INTRAMUSCULAR | Status: AC
  Filled 2013-12-20: qty 1

## 2013-12-20 MED ORDER — ONDANSETRON HCL 4 MG/2ML IJ SOLN
4.0000 mg | Freq: Once | INTRAMUSCULAR | Status: AC
  Administered 2013-12-20: 4 mg via INTRAVENOUS

## 2013-12-20 MED ORDER — TETANUS-DIPHTH-ACELL PERTUSSIS 5-2.5-18.5 LF-MCG/0.5 IM SUSP
0.5000 mL | Freq: Once | INTRAMUSCULAR | Status: AC
  Administered 2013-12-20: 0.5 mL via INTRAMUSCULAR
  Filled 2013-12-20: qty 0.5

## 2013-12-20 MED ORDER — MORPHINE SULFATE 4 MG/ML IJ SOLN
4.0000 mg | Freq: Once | INTRAMUSCULAR | Status: AC
  Administered 2013-12-20: 4 mg via INTRAVENOUS
  Filled 2013-12-20: qty 1

## 2013-12-20 MED ORDER — LIDOCAINE-EPINEPHRINE (PF) 1 %-1:200000 IJ SOLN: 10.0000 mL | Freq: Once | INTRAMUSCULAR | Status: DC

## 2013-12-20 NOTE — ED Notes (Signed)
Got out of car, was trying to get into the house quickly because she had an urgent need to have a BM. Next thing she remembers is the rescue squad over her. Pt apparently fell over backwards striking her head on concrete.arrives with horse collar neck support and pressure dressing on back of head.

## 2013-12-20 NOTE — ED Provider Notes (Signed)
CSN: [id number]     Arrival date & time 12/20/13  2011 History   First MD Initiated Contact with Patient 12/20/13 2025     Chief Complaint  Patient presents with  . Fall     (Consider location/radiation/quality/duration/timing/severity/associated sxs/prior Treatment) HPI  68 year old female presenting after a fall. Patient was ambulating in her house just prior to arrival. She had urgent need to have a bowel movement and while on her way in she passed out. She fell backwards striking the back of her head. She is not sure if she lost consciousness before the fall or as a result of the fall. Brief period LOC. Mild confusion afterwards, now back to her baseline. Wound to the back of her head. She is on Coumadin for history of aortic valve replacement. Nausea, no vomiting. No acute visual changes. No acute numbness, tingling or loss of strength. Unsure of last tetanus.  Past Medical History  Diagnosis Date  . Diabetes mellitus   . Hypertension   . High cholesterol   . Breast cancer 11/2003  . Colonic adenoma   . OSA (obstructive sleep apnea)     AHI 45.0/hr, REM sleep was 231 mins.sleep was 77% and 84%, RDI was 21.5/hr total sleep was 2h 58 mins  . Hx of echocardiogram 04/28/2012    EF>55%, Moderate to severe mitral annular calcification, mild mitral regurgitation, mild mitral stenosis, mild tricuspid regurgitation, the prosthetic aortic valve is not well visualized, 21 mm St Jude Regent michanical valve there is aortic root sclerosis/calcification. 17mmhg midcavity gradient.  . History of stress test 04/2008    Normal myocardial perfusion scan demonstrating an attenuationartifact in the anterior region of the myocardium. No ischemia or infarct/scar is seen in the remaining myocardium. compared to the previous study, there is no significant change. No significant ischemia demonstrated. this is a low risk study.  . Aortic stenosis   . H/O aortic valve replacement    Past Surgical History   Procedure Laterality Date  . Coronary artery bypass graft    . Breast surgery  12/16/2003  . Aortic valve replacement  08/01/10    By Dr Arvid Right, St Jude AVR with 21 mm St Jude regent mechanical valve  . Colonoscopy  03/04/2003    Dr. Gala Romney- internal hemorrhoids o/w normal rectum, scattered L sided diveritula  . Colonoscopy  04/12/2009    Dr. Imogene Burn polyp, L dided diverticula, tubular adenoma on bx  . Colonoscopy N/A 01/06/2013    RMR; multiple colonic/rectal polyps s/p polypectomy. Fragments of tubular adenomas, needs surveillance Oct 2014  . Colonoscopy N/A 01/18/2013    ZHG:DJMEQA polypectomy site with bleeding, s/p bleeding control therapy  . Cardiac catheterization  05/2010    Normal coronary arteries angiographically, normal left ventricular functionultrasonographically and severe aortic stenosis by right cath. Optimal therapy would be elective aortic valve replacement   Family History  Problem Relation Age of Onset  . Heart failure Mother   . CAD    . Diabetes     History  Substance Use Topics  . Smoking status: Never Smoker   . Smokeless tobacco: Never Used  . Alcohol Use: No   OB History   Grav Para Term Preterm Abortions TAB SAB Ect Mult Living                 Review of Systems  All systems reviewed and negative, other than as noted in HPI.   Allergies  Demerol  Home Medications   Current Outpatient Rx  Name  Route  Sig  Dispense  Refill  . citalopram (CELEXA) 40 MG tablet   Oral   Take 1 tablet by mouth every evening.         . cyanocobalamin (,VITAMIN B-12,) 1000 MCG/ML injection   Intramuscular   Inject 1,000 mcg into the muscle every 30 (thirty) days.         Marland Kitchen dexlansoprazole (DEXILANT) 60 MG capsule   Oral   Take 1 capsule (60 mg total) by mouth daily.   30 capsule   3   . Docusate Sodium (COLACE PO)   Oral   Take 1 tablet by mouth 2 (two) times daily.         Marland Kitchen FIBER SELECT GUMMIES CHEW   Oral   Chew 1 tablet by mouth 2  (two) times daily.         . fluticasone (FLONASE) 50 MCG/ACT nasal spray   Nasal   Place 2 sprays into the nose daily.         . insulin aspart protamine- aspart (NOVOLOG MIX 70/30) (70-30) 100 UNIT/ML injection   Subcutaneous   Inject 20-30 Units into the skin 2 (two) times daily with a meal.         . Loratadine 10 MG CAPS   Oral   Take 10 mg by mouth daily as needed.         . metFORMIN (GLUCOPHAGE) 500 MG tablet   Oral   Take 500 mg by mouth 2 (two) times daily with a meal.         . omeprazole (PRILOSEC) 20 MG capsule   Oral   Take 1 capsule (20 mg total) by mouth daily.   30 capsule   3   . pantoprazole (PROTONIX) 40 MG tablet   Oral   Take 40 mg by mouth daily as needed.         . warfarin (COUMADIN) 5 MG tablet      TAKE 1 TO 1.5 TABLETS BY MOUTH DAILY   40 tablet   4    BP 172/77  Pulse 78  Temp(Src) 97.5 F (36.4 C) (Oral)  Resp 18  Ht 5' 3.5" (1.613 m)  Wt 255 lb (115.667 kg)  BMI 44.46 kg/m2  SpO2 94% Physical Exam  Nursing note and vitals reviewed. Constitutional: She is oriented to person, place, and time. She appears well-developed and well-nourished. No distress.  Laying in bed. NAD. Obese.   HENT:  Head: Normocephalic.  Posterior scalp laceration/abrasion occipital region. Extensive hematoma.   Eyes: Conjunctivae are normal. Right eye exhibits no discharge. Left eye exhibits no discharge.  Neck: Neck supple.  No midline spinal tenderness  Cardiovascular: Normal rate, regular rhythm and normal heart sounds.  Exam reveals no gallop and no friction rub.   No murmur heard. Pulmonary/Chest: Effort normal and breath sounds normal. No respiratory distress.  Abdominal: Soft. She exhibits no distension. There is no tenderness.  Musculoskeletal: She exhibits no edema and no tenderness.  Neurological: She is alert and oriented to person, place, and time. No cranial nerve deficit. She exhibits normal muscle tone. Coordination normal.   Skin: Skin is warm and dry.  Psychiatric: She has a normal mood and affect. Her behavior is normal. Thought content normal.    ED Course  Procedures (including critical care time)  CRITICAL CARE Performed by: Virgel Manifold  Total critical care time: 35 minutes  Critical care time was exclusive of separately billable procedures and treating other patients. Critical care  was necessary to treat or prevent imminent or life-threatening deterioration. Critical care was time spent personally by me on the following activities: development of treatment plan with patient and/or surrogate as well as nursing, discussions with consultants, evaluation of patient's response to treatment, examination of patient, obtaining history from patient or surrogate, ordering and performing treatments and interventions, ordering and review of laboratory studies, ordering and review of radiographic studies, pulse oximetry and re-evaluation of patient's condition.  Labs Review Labs Reviewed  PROTIME-INR - Abnormal; Notable for the following:    Prothrombin Time 34.7 (*)    INR 3.62 (*)    All other components within normal limits  BASIC METABOLIC PANEL - Abnormal; Notable for the following:    Glucose, Bld 206 (*)    BUN 29 (*)    Creatinine, Ser 1.26 (*)    GFR calc non Af Amer 42 (*)    GFR calc Af Amer 49 (*)    All other components within normal limits  CBC WITH DIFFERENTIAL - Abnormal; Notable for the following:    WBC 11.0 (*)    Hemoglobin 11.7 (*)    MCH 25.3 (*)    Platelets 95 (*)    Monocytes Absolute 1.1 (*)    All other components within normal limits  CBG MONITORING, ED - Abnormal; Notable for the following:    Glucose-Capillary 152 (*)    All other components within normal limits  TYPE AND SCREEN  PREPARE FRESH FROZEN PLASMA   Imaging Review Ct Head Wo Contrast  12/20/2013   CLINICAL DATA:  Trauma.  EXAM: CT HEAD WITHOUT CONTRAST  CT CERVICAL SPINE WITHOUT CONTRAST  TECHNIQUE:  Multidetector CT imaging of the head and cervical spine was performed following the standard protocol without intravenous contrast. Multiplanar CT image reconstructions of the cervical spine were also generated.  COMPARISON:  07/19/2011 CT head. 11/16/2007 head CT and facial CT. No comparison cervical spine CT.  FINDINGS: CT HEAD FINDINGS  Large posterior parietal -occipital subcutaneous hematoma without underlying fracture.  Scattered subarachnoid blood left frontal lobe gyri and right parietal-occipital lobe medially located gyri.  No CT evidence of large acute thrombotic infarct.  No hydrocephalus.  No intracranial mass lesion noted on this unenhanced exam.  CT CERVICAL SPINE FINDINGS  Examination is slightly limited by patient's habitus. No cervical spine fracture is detected.  Straightening of the cervical spine may be related to head position or spasm. If there is a high clinical suspicion of ligamentous injury, flexion and extension views or MR can be performed for further delineation.  Prominent calcification palatini tonsils.  IMPRESSION: CT HEAD:  Large posterior parietal -occipital subcutaneous hematoma without underlying fracture.  Scattered subarachnoid blood left frontal lobe gyri and right parietal-occipital lobe medially located gyri.  CT CERVICAL SPINE:  Examination is slightly limited by patient's habitus.  No cervical spine fracture is detected.  Straightening of the cervical spine may be related to head position or spasm. If there is a high clinical suspicion of ligamentous injury, flexion and extension views or MR can be performed for further delineation.  These results were called by telephone at the time of interpretation on 12/20/2013 at 9:06 PM to Dr. Virgel Manifold , who verbally acknowledged these results.   Electronically Signed   By: Chauncey Cruel M.D.   On: 12/20/2013 21:16   Ct Cervical Spine Wo Contrast  12/20/2013   CLINICAL DATA:  Trauma.  EXAM: CT HEAD WITHOUT CONTRAST  CT CERVICAL  SPINE WITHOUT CONTRAST  TECHNIQUE:  Multidetector CT imaging of the head and cervical spine was performed following the standard protocol without intravenous contrast. Multiplanar CT image reconstructions of the cervical spine were also generated.  COMPARISON:  07/19/2011 CT head. 11/16/2007 head CT and facial CT. No comparison cervical spine CT.  FINDINGS: CT HEAD FINDINGS  Large posterior parietal -occipital subcutaneous hematoma without underlying fracture.  Scattered subarachnoid blood left frontal lobe gyri and right parietal-occipital lobe medially located gyri.  No CT evidence of large acute thrombotic infarct.  No hydrocephalus.  No intracranial mass lesion noted on this unenhanced exam.  CT CERVICAL SPINE FINDINGS  Examination is slightly limited by patient's habitus. No cervical spine fracture is detected.  Straightening of the cervical spine may be related to head position or spasm. If there is a high clinical suspicion of ligamentous injury, flexion and extension views or MR can be performed for further delineation.  Prominent calcification palatini tonsils.  IMPRESSION: CT HEAD:  Large posterior parietal -occipital subcutaneous hematoma without underlying fracture.  Scattered subarachnoid blood left frontal lobe gyri and right parietal-occipital lobe medially located gyri.  CT CERVICAL SPINE:  Examination is slightly limited by patient's habitus.  No cervical spine fracture is detected.  Straightening of the cervical spine may be related to head position or spasm. If there is a high clinical suspicion of ligamentous injury, flexion and extension views or MR can be performed for further delineation.  These results were called by telephone at the time of interpretation on 12/20/2013 at 9:06 PM to Dr. Virgel Manifold , who verbally acknowledged these results.   Electronically Signed   By: Chauncey Cruel M.D.   On: 12/20/2013 21:16     EKG Interpretation None      MDM   Final diagnoses:  Subarachnoid  hemorrhage, traumatic  Scalp laceration  Anticoagulated on Coumadin  Thrombocytopenia   68yF s/p fall on coumadin. Suspect that vasovagal episode with urgent feeling needing to have a bowel movement just preceding. Small scattered areas of subarachnoid blood. INR 3.6. Extensive subcutaneous scalp hematoma preventing approximating wound margins enough to staple. Tried injecting lidocaine with epi and then evacuating enough to relieve tension but just resulting in a mess and still unable to close. New pressure dressing applied. Intracranial and scalp bleeding felt to currently supercede artifical valve and reversal initiated with vitamin K. HD stable. Nonfocal neuro exam. Discussed with Dr Ellene Route, neurosurgery, and Dr. Ninfa Linden, trauma. Will transfer to [facility] ED.    Virgel Manifold, MD 12/20/13 2303

## 2013-12-21 ENCOUNTER — Inpatient Hospital Stay (HOSPITAL_COMMUNITY): Payer: Medicare Other

## 2013-12-21 ENCOUNTER — Encounter (HOSPITAL_COMMUNITY): Payer: Self-pay | Admitting: Cardiology

## 2013-12-21 DIAGNOSIS — S066X9A Traumatic subarachnoid hemorrhage with loss of consciousness of unspecified duration, initial encounter: Secondary | ICD-10-CM

## 2013-12-21 DIAGNOSIS — S0100XA Unspecified open wound of scalp, initial encounter: Secondary | ICD-10-CM

## 2013-12-21 DIAGNOSIS — I2510 Atherosclerotic heart disease of native coronary artery without angina pectoris: Secondary | ICD-10-CM

## 2013-12-21 DIAGNOSIS — S069X9A Unspecified intracranial injury with loss of consciousness of unspecified duration, initial encounter: Secondary | ICD-10-CM

## 2013-12-21 DIAGNOSIS — S069XAA Unspecified intracranial injury with loss of consciousness status unknown, initial encounter: Secondary | ICD-10-CM

## 2013-12-21 DIAGNOSIS — D649 Anemia, unspecified: Secondary | ICD-10-CM | POA: Diagnosis present

## 2013-12-21 DIAGNOSIS — E119 Type 2 diabetes mellitus without complications: Secondary | ICD-10-CM

## 2013-12-21 DIAGNOSIS — Z7901 Long term (current) use of anticoagulants: Secondary | ICD-10-CM

## 2013-12-21 DIAGNOSIS — S0101XA Laceration without foreign body of scalp, initial encounter: Secondary | ICD-10-CM | POA: Diagnosis present

## 2013-12-21 DIAGNOSIS — W19XXXA Unspecified fall, initial encounter: Secondary | ICD-10-CM | POA: Diagnosis present

## 2013-12-21 DIAGNOSIS — Z954 Presence of other heart-valve replacement: Secondary | ICD-10-CM

## 2013-12-21 DIAGNOSIS — S066XAA Traumatic subarachnoid hemorrhage with loss of consciousness status unknown, initial encounter: Secondary | ICD-10-CM | POA: Diagnosis present

## 2013-12-21 DIAGNOSIS — I10 Essential (primary) hypertension: Secondary | ICD-10-CM

## 2013-12-21 HISTORY — DX: Traumatic subarachnoid hemorrhage with loss of consciousness of unspecified duration, initial encounter: S06.6X9A

## 2013-12-21 HISTORY — DX: Traumatic subarachnoid hemorrhage with loss of consciousness status unknown, initial encounter: S06.6XAA

## 2013-12-21 LAB — BASIC METABOLIC PANEL
BUN: 31 mg/dL — AB (ref 6–23)
CO2: 23 mEq/L (ref 19–32)
CREATININE: 1.49 mg/dL — AB (ref 0.50–1.10)
Calcium: 8.4 mg/dL (ref 8.4–10.5)
Chloride: 107 mEq/L (ref 96–112)
GFR, EST AFRICAN AMERICAN: 40 mL/min — AB (ref >90)
GFR, EST NON AFRICAN AMERICAN: 35 mL/min — AB (ref >90)
Glucose, Bld: 289 mg/dL — ABNORMAL HIGH (ref 70–99)
POTASSIUM: 5.2 meq/L (ref 3.7–5.3)
Sodium: 142 mEq/L (ref 137–147)

## 2013-12-21 LAB — CBC
HCT: 28.3 % — ABNORMAL LOW (ref 36.0–46.0)
HEMATOCRIT: 30.7 % — AB (ref 36.0–46.0)
Hemoglobin: 9.8 g/dL — ABNORMAL LOW (ref 12.0–15.0)
Hemoglobin: 9.3 g/dL — ABNORMAL LOW (ref 12.0–15.0)
MCH: 24.6 pg — ABNORMAL LOW (ref 26.0–34.0)
MCH: 25.3 pg — ABNORMAL LOW (ref 26.0–34.0)
MCHC: 31.9 g/dL (ref 30.0–36.0)
MCHC: 32.9 g/dL (ref 30.0–36.0)
MCV: 77.1 fL — ABNORMAL LOW (ref 78.0–100.0)
MCV: 76.9 fL — AB (ref 78.0–100.0)
PLATELETS: 103 10*3/uL — AB (ref 150–400)
Platelets: 86 10*3/uL — ABNORMAL LOW (ref 150–400)
RBC: 3.98 MIL/uL (ref 3.87–5.11)
RBC: 3.68 MIL/uL — ABNORMAL LOW (ref 3.87–5.11)
RDW: 13.9 % (ref 11.5–15.5)
RDW: 14.2 % (ref 11.5–15.5)
WBC: 15.6 10*3/uL — ABNORMAL HIGH (ref 4.0–10.5)
WBC: 12 10*3/uL — ABNORMAL HIGH (ref 4.0–10.5)

## 2013-12-21 LAB — GLUCOSE, CAPILLARY
Glucose-Capillary: 256 mg/dL — ABNORMAL HIGH (ref 70–99)
Glucose-Capillary: 156 mg/dL — ABNORMAL HIGH (ref 70–99)
Glucose-Capillary: 144 mg/dL — ABNORMAL HIGH (ref 70–99)
Glucose-Capillary: 171 mg/dL — ABNORMAL HIGH (ref 70–99)
Glucose-Capillary: 207 mg/dL — ABNORMAL HIGH (ref 70–99)

## 2013-12-21 LAB — PROTIME-INR
INR: 1.59 — ABNORMAL HIGH (ref 0.00–1.49)
Prothrombin Time: 18.5 s — ABNORMAL HIGH (ref 11.6–15.2)

## 2013-12-21 LAB — MRSA PCR SCREENING: MRSA by PCR: NEGATIVE

## 2013-12-21 MED ORDER — ACETAMINOPHEN 325 MG PO TABS
650.0000 mg | ORAL_TABLET | Freq: Four times a day (QID) | ORAL | Status: DC | PRN
  Administered 2013-12-21 (×3): 325 mg via ORAL
  Filled 2013-12-21 (×3): qty 2

## 2013-12-21 MED ORDER — SODIUM CHLORIDE 0.9 % IV BOLUS (SEPSIS)
1000.0000 mL | Freq: Once | INTRAVENOUS | Status: AC
  Administered 2013-12-21: 1000 mL via INTRAVENOUS

## 2013-12-21 MED ORDER — ONDANSETRON HCL 4 MG/2ML IJ SOLN
4.0000 mg | INTRAMUSCULAR | Status: AC
  Administered 2013-12-21: 4 mg via INTRAVENOUS
  Filled 2013-12-21: qty 2

## 2013-12-21 MED ORDER — INSULIN ASPART 100 UNIT/ML ~~LOC~~ SOLN
0.0000 [IU] | Freq: Three times a day (TID) | SUBCUTANEOUS | Status: DC
  Administered 2013-12-21 (×2): 3 [IU] via SUBCUTANEOUS
  Administered 2013-12-21: 2 [IU] via SUBCUTANEOUS
  Administered 2013-12-22 (×2): 5 [IU] via SUBCUTANEOUS

## 2013-12-21 MED ORDER — METFORMIN HCL 500 MG PO TABS
500.0000 mg | ORAL_TABLET | Freq: Two times a day (BID) | ORAL | Status: DC
  Administered 2013-12-21: 500 mg via ORAL
  Filled 2013-12-21 (×3): qty 1

## 2013-12-21 MED ORDER — MORPHINE SULFATE 4 MG/ML IJ SOLN: 4.0000 mg | INTRAMUSCULAR | Status: DC | PRN

## 2013-12-21 MED ORDER — ONDANSETRON HCL 4 MG/2ML IJ SOLN: 4.0000 mg | Freq: Four times a day (QID) | INTRAMUSCULAR | Status: DC | PRN

## 2013-12-21 MED ORDER — ONDANSETRON HCL 4 MG PO TABS: 4.0000 mg | ORAL_TABLET | Freq: Four times a day (QID) | ORAL | Status: DC | PRN

## 2013-12-21 MED ORDER — INSULIN ASPART 100 UNIT/ML ~~LOC~~ SOLN
0.0000 [IU] | SUBCUTANEOUS | Status: DC
  Administered 2013-12-21: 11 [IU] via SUBCUTANEOUS

## 2013-12-21 MED ORDER — MORPHINE SULFATE 2 MG/ML IJ SOLN: 1.0000 mg | INTRAMUSCULAR | Status: DC | PRN

## 2013-12-21 MED ORDER — POTASSIUM CHLORIDE IN NACL 20-0.9 MEQ/L-% IV SOLN
INTRAVENOUS | Status: DC
  Administered 2013-12-21: 03:00:00 via INTRAVENOUS
  Filled 2013-12-21 (×2): qty 1000

## 2013-12-21 MED ORDER — VITAMIN K1 10 MG/ML IJ SOLN
5.0000 mg | Freq: Once | INTRAVENOUS | Status: AC
  Administered 2013-12-21: 5 mg via INTRAVENOUS
  Filled 2013-12-21: qty 0.5

## 2013-12-21 MED ORDER — OXYCODONE-ACETAMINOPHEN 5-325 MG PO TABS: 1.0000 | ORAL_TABLET | ORAL | Status: DC | PRN

## 2013-12-21 MED ORDER — MORPHINE SULFATE 2 MG/ML IJ SOLN: 2.0000 mg | INTRAMUSCULAR | Status: DC | PRN

## 2013-12-21 MED ORDER — PANTOPRAZOLE SODIUM 40 MG IV SOLR
40.0000 mg | Freq: Every day | INTRAVENOUS | Status: DC
  Filled 2013-12-21 (×2): qty 40

## 2013-12-21 MED ORDER — FUROSEMIDE 20 MG PO TABS
10.0000 mg | ORAL_TABLET | Freq: Every morning | ORAL | Status: DC
  Administered 2013-12-21 – 2013-12-22 (×2): 10 mg via ORAL
  Filled 2013-12-21 (×2): qty 0.5

## 2013-12-21 MED ORDER — PANTOPRAZOLE SODIUM 40 MG PO TBEC
40.0000 mg | DELAYED_RELEASE_TABLET | Freq: Every day | ORAL | Status: DC
  Administered 2013-12-21 – 2013-12-22 (×2): 40 mg via ORAL
  Filled 2013-12-21 (×2): qty 1

## 2013-12-21 NOTE — ED Provider Notes (Signed)
  Patient in the ED, transferred from [facility]. Pt with a fall, on coumadin. CT shows: Large posterior parietal -occipital subcutaneous hematoma without underlying fracture. Scattered subarachnoid blood left frontal lobe gyri and right parietal-occipital lobe medially located gyri.   INR > 3. Vitamin K given. Neuro exam is WNL at this time - CN 2-12 , grip strength and gross sensory exam completed. Pt has no dizziness, no visual complains, no headaches. + nausea.  Patient had no complains, no concerns from the nursing side. Will continue to monitor. Filed Vitals:   12/20/13 2322  BP: 150/66  Pulse:   Temp:   Resp:     Surgery has been notified. Per note, it appears that patient has persistent bleed from the scalp, that they were unable to repair as proximation was difficult. She has pressure dressing on, not soaked, we haven't removed it in the ER, and will let Surgery assess.   Varney Biles, MD 12/21/13 0045

## 2013-12-21 NOTE — Progress Notes (Signed)
PHARMACIST - PHYSICIAN COMMUNICATION DR:  Hulen Skains CONCERNING:  METFORMIN SAFE ADMINISTRATION POLICY  RECOMMENDATION: Metformin has been placed on DISCONTINUE (rejected order) STATUS and should be reordered only after any of the conditions below are ruled out.  Current safety recommendations include avoiding metformin for a minimum of 48 hours after the patient's exposure to intravenous contrast media.  DESCRIPTION:  The Pharmacy Committee has adopted a policy that restricts the use of metformin in hospitalized patients until all the contraindications to administration have been ruled out. Specific contraindications are: []  Serum creatinine ? 1.5 for males [x]  Serum creatinine ? 1.4 for females []  Shock, acute MI, sepsis, hypoxemia, dehydration []  Planned administration of intravenous iodinated contrast media []  Heart Failure patients with low EF []  Acute or chronic metabolic acidosis (including DKA)     Salome Arnt, PharmD, BCPS Pager # [phone] 12/21/2013 9:04 AM

## 2013-12-21 NOTE — H&P (Signed)
History   Hailey Curry is an 68 y.o. female.   Chief Complaint:  Chief Complaint  Patient presents with  . Fall    Fall  This is a 68 year old female status post a syncopal episode and fall earlier this evening. She hit the back of her head. There was a brief loss of consciousness. She was taken to [facility] A CAT scan of the brain showed a traumatic brain injury. Neurosurgery was called  And recommended the patient be transferred to [facility].  She had a large scalp laceration and hematoma which could not be repaired by the emergency room physician. A pressure dressing was applied. Apparently, she was given intravenous vitamin K. She arrived here awake and alert with minimal headache. She is otherwise without complaints  Past Medical History  Diagnosis Date  . Diabetes mellitus   . Hypertension   . High cholesterol   . Breast cancer 11/2003  . Colonic adenoma   . OSA (obstructive sleep apnea)     AHI 45.0/hr, REM sleep was 231 mins.sleep was 77% and 84%, RDI was 21.5/hr total sleep was 2h 58 mins  . Hx of echocardiogram 04/28/2012    EF>55%, Moderate to severe mitral annular calcification, mild mitral regurgitation, mild mitral stenosis, mild tricuspid regurgitation, the prosthetic aortic valve is not well visualized, 21 mm St Jude Regent michanical valve there is aortic root sclerosis/calcification. 31mhg midcavity gradient.  . History of stress test 04/2008    Normal myocardial perfusion scan demonstrating an attenuationartifact in the anterior region of the myocardium. No ischemia or infarct/scar is seen in the remaining myocardium. compared to the previous study, there is no significant change. No significant ischemia demonstrated. this is a low risk study.  . Aortic stenosis   . H/O aortic valve replacement     Past Surgical History  Procedure Laterality Date  . Coronary artery bypass graft    . Breast surgery  12/16/2003  . Aortic valve replacement   08/01/10    By Dr BArvid Right St Jude AVR with 21 mm St Jude regent mechanical valve  . Colonoscopy  03/04/2003    Dr. RGala Romney internal hemorrhoids o/w normal rectum, scattered L sided diveritula  . Colonoscopy  04/12/2009    Dr. RImogene Burnpolyp, L dided diverticula, tubular adenoma on bx  . Colonoscopy N/A 01/06/2013    RMR; multiple colonic/rectal polyps s/p polypectomy. Fragments of tubular adenomas, needs surveillance Oct 2014  . Colonoscopy N/A 01/18/2013    RFMB:WGYKZLpolypectomy site with bleeding, s/p bleeding control therapy  . Cardiac catheterization  05/2010    Normal coronary arteries angiographically, normal left ventricular functionultrasonographically and severe aortic stenosis by right cath. Optimal therapy would be elective aortic valve replacement    Family History  Problem Relation Age of Onset  . Heart failure Mother   . CAD    . Diabetes     Social History:  reports that she has never smoked. She has never used smokeless tobacco. She reports that she does not drink alcohol or use illicit drugs.  Allergies   Allergies  Allergen Reactions  . Demerol Other (See Comments)    Blood pressure goes up    Home Medications   (Not in a hospital admission)  Trauma Course   Results for orders placed during the hospital encounter of 12/20/13 (from the past 48 hour(s))  PROTIME-INR     Status: Abnormal   Collection Time    12/20/13  8:34 PM  Result Value Ref Range   Prothrombin Time 34.7 (*) 11.6 - 15.2 seconds   INR 3.62 (*) 0.00 - 9.38  BASIC METABOLIC PANEL     Status: Abnormal   Collection Time    12/20/13  8:34 PM      Result Value Ref Range   Sodium 139  137 - 147 mEq/L   Potassium 4.3  3.7 - 5.3 mEq/L   Chloride 102  96 - 112 mEq/L   CO2 27  19 - 32 mEq/L   Glucose, Bld 206 (*) 70 - 99 mg/dL   BUN 29 (*) 6 - 23 mg/dL   Creatinine, Ser 1.26 (*) 0.50 - 1.10 mg/dL   Calcium 9.1  8.4 - 10.5 mg/dL   GFR calc non Af Amer 42 (*) >90 mL/min   GFR calc Af  Amer 49 (*) >90 mL/min   Comment: (NOTE)     The eGFR has been calculated using the CKD EPI equation.     This calculation has not been validated in all clinical situations.     eGFR's persistently <90 mL/min signify possible Chronic Kidney     Disease.  CBC WITH DIFFERENTIAL     Status: Abnormal   Collection Time    12/20/13  8:34 PM      Result Value Ref Range   WBC 11.0 (*) 4.0 - 10.5 K/uL   RBC 4.63  3.87 - 5.11 MIL/uL   Hemoglobin 11.7 (*) 12.0 - 15.0 g/dL   HCT 36.1  36.0 - 46.0 %   MCV 78.0  78.0 - 100.0 fL   MCH 25.3 (*) 26.0 - 34.0 pg   MCHC 32.4  30.0 - 36.0 g/dL   RDW 14.0  11.5 - 15.5 %   Platelets 95 (*) 150 - 400 K/uL   Comment: RESULT REPEATED AND VERIFIED   Neutrophils Relative % 69  43 - 77 %   Lymphocytes Relative 20  12 - 46 %   Monocytes Relative 10  3 - 12 %   Eosinophils Relative 1  0 - 5 %   Basophils Relative 0  0 - 1 %   Neutro Abs 7.6  1.7 - 7.7 K/uL   Lymphs Abs 2.2  0.7 - 4.0 K/uL   Monocytes Absolute 1.1 (*) 0.1 - 1.0 K/uL   Eosinophils Absolute 0.1  0.0 - 0.7 K/uL   Basophils Absolute 0.0  0.0 - 0.1 K/uL   RBC Morphology STOMATOCYTES     WBC Morphology TOXIC GRANULATION     Smear Review PLATELET COUNT CONFIRMED BY SMEAR    CBG MONITORING, ED     Status: Abnormal   Collection Time    12/20/13  8:35 PM      Result Value Ref Range   Glucose-Capillary 152 (*) 70 - 99 mg/dL  CBG MONITORING, ED     Status: Abnormal   Collection Time    12/20/13 10:59 PM      Result Value Ref Range   Glucose-Capillary 242 (*) 70 - 99 mg/dL   Ct Head Wo Contrast  12/20/2013   CLINICAL DATA:  Trauma.  EXAM: CT HEAD WITHOUT CONTRAST  CT CERVICAL SPINE WITHOUT CONTRAST  TECHNIQUE: Multidetector CT imaging of the head and cervical spine was performed following the standard protocol without intravenous contrast. Multiplanar CT image reconstructions of the cervical spine were also generated.  COMPARISON:  07/19/2011 CT head. 11/16/2007 head CT and facial CT. No comparison  cervical spine CT.  FINDINGS: CT HEAD FINDINGS  Large posterior parietal -occipital subcutaneous hematoma without underlying fracture.  Scattered subarachnoid blood left frontal lobe gyri and right parietal-occipital lobe medially located gyri.  No CT evidence of large acute thrombotic infarct.  No hydrocephalus.  No intracranial mass lesion noted on this unenhanced exam.  CT CERVICAL SPINE FINDINGS  Examination is slightly limited by patient's habitus. No cervical spine fracture is detected.  Straightening of the cervical spine may be related to head position or spasm. If there is a high clinical suspicion of ligamentous injury, flexion and extension views or MR can be performed for further delineation.  Prominent calcification palatini tonsils.  IMPRESSION: CT HEAD:  Large posterior parietal -occipital subcutaneous hematoma without underlying fracture.  Scattered subarachnoid blood left frontal lobe gyri and right parietal-occipital lobe medially located gyri.  CT CERVICAL SPINE:  Examination is slightly limited by patient's habitus.  No cervical spine fracture is detected.  Straightening of the cervical spine may be related to head position or spasm. If there is a high clinical suspicion of ligamentous injury, flexion and extension views or MR can be performed for further delineation.  These results were called by telephone at the time of interpretation on 12/20/2013 at 9:06 PM to Dr. Virgel Manifold , who verbally acknowledged these results.   Electronically Signed   By: Chauncey Cruel M.D.   On: 12/20/2013 21:16   Ct Cervical Spine Wo Contrast  12/20/2013   CLINICAL DATA:  Trauma.  EXAM: CT HEAD WITHOUT CONTRAST  CT CERVICAL SPINE WITHOUT CONTRAST  TECHNIQUE: Multidetector CT imaging of the head and cervical spine was performed following the standard protocol without intravenous contrast. Multiplanar CT image reconstructions of the cervical spine were also generated.  COMPARISON:  07/19/2011 CT head. 11/16/2007  head CT and facial CT. No comparison cervical spine CT.  FINDINGS: CT HEAD FINDINGS  Large posterior parietal -occipital subcutaneous hematoma without underlying fracture.  Scattered subarachnoid blood left frontal lobe gyri and right parietal-occipital lobe medially located gyri.  No CT evidence of large acute thrombotic infarct.  No hydrocephalus.  No intracranial mass lesion noted on this unenhanced exam.  CT CERVICAL SPINE FINDINGS  Examination is slightly limited by patient's habitus. No cervical spine fracture is detected.  Straightening of the cervical spine may be related to head position or spasm. If there is a high clinical suspicion of ligamentous injury, flexion and extension views or MR can be performed for further delineation.  Prominent calcification palatini tonsils.  IMPRESSION: CT HEAD:  Large posterior parietal -occipital subcutaneous hematoma without underlying fracture.  Scattered subarachnoid blood left frontal lobe gyri and right parietal-occipital lobe medially located gyri.  CT CERVICAL SPINE:  Examination is slightly limited by patient's habitus.  No cervical spine fracture is detected.  Straightening of the cervical spine may be related to head position or spasm. If there is a high clinical suspicion of ligamentous injury, flexion and extension views or MR can be performed for further delineation.  These results were called by telephone at the time of interpretation on 12/20/2013 at 9:06 PM to Dr. Virgel Manifold , who verbally acknowledged these results.   Electronically Signed   By: Chauncey Cruel M.D.   On: 12/20/2013 21:16    Review of Systems  All other systems reviewed and are negative.    Blood pressure 150/66, pulse 97, temperature 97.5 F (36.4 C), temperature source Oral, resp. rate 15, height 5' 3.5" (1.613 m), weight 255 lb (115.667 kg), SpO2 91.00%. Physical Exam  Constitutional: She is oriented  to person, place, and time. She appears well-developed and well-nourished.  No distress.  obese  HENT:  Right Ear: External ear normal.  Left Ear: External ear normal.  Nose: Nose normal.  Mouth/Throat: Oropharynx is clear and moist. No oropharyngeal exudate.  7 cm posterior scalp laceration with hematoma and active bleeding  Eyes: Conjunctivae are normal. Pupils are equal, round, and reactive to light. Right eye exhibits no discharge. Left eye exhibits no discharge.  Neck: Normal range of motion. Neck supple. No tracheal deviation present.  Cardiovascular: Normal rate, regular rhythm and intact distal pulses.   Murmur heard. Respiratory: Effort normal and breath sounds normal. No respiratory distress. She has no wheezes.  GI: Soft. Bowel sounds are normal. She exhibits no distension. There is no tenderness.  Musculoskeletal: Normal range of motion. She exhibits no edema and no tenderness.  Lymphadenopathy:    She has no cervical adenopathy.  Neurological: She is alert and oriented to person, place, and time.  Skin: Skin is dry. No rash noted. She is not diaphoretic. No erythema.  Psychiatric: Her behavior is normal. Judgment normal.     Assessment/Plan Traumatic brain injury status post fall with 7 cm scalp laceration and hematoma  I had to emergently repair the scalp laceration in the emergency department. I prepped her head with Betadine, anesthetized it with 1% lidocaine with epinephrine. I then irrigated the wound with saline. I had to trim a large amount of hair with scissors and a razor. There was a large amount of bleeding from the scalp laceration. I evacuated hematoma. It was difficult to initially controlled bleeding with 3-0 Vicryl sutures. I was then able to control the hemorrhage with a combination of 3-0 Vicryl sutures, Surgicel, 2-0 nylon sutures, and skin staples.  Dr. Ellene Route is aware of the patient's admission from a neurosurgical standpoint. She will be admitted to the intensive care unit. I will transfuse FFP and check a CBC. A repeat CAT scan  of her head has been ordered to follow the traumatic brain injury.  Sedonia Kitner A 12/21/2013, 1:26 AM   Procedures

## 2013-12-21 NOTE — Progress Notes (Signed)
Patient is awake and alert.  GCS 15.  INR back at 1.59.  Repeat CT head show some mild progression of contrecoup SAH.   Will allow the patient to have a carbohydrate modified diet. It sounds like patient likely tripped and hit her head.  Not as likely to have been a syncopal episode as originally thought.  Will get Dr. Gwenlyn Found to see the patient today.  Kathryne Eriksson. Dahlia Bailiff, MD, [place] [phone] Trauma Surgeon

## 2013-12-21 NOTE — ED Notes (Signed)
Pt. Arrived by carelink with pressure dressing applied to back of head. Pt. Alert and oriented x4. Neuro exam WDL. Dr. Kathrynn Humble at bedside

## 2013-12-21 NOTE — Evaluation (Addendum)
Physical Therapy Evaluation and Discharge Patient Details Name: Hailey Curry MRN: [id number] DOB: July 30, 1946 Today's Date: 12/21/2013   History of Present Illness  This is a 68 year old female status post a syncopal episode and fall earlier this evening. She hit the back of her head. There was a brief loss of consciousness. She was taken to [facility]. A CAT scan of the brain showed a traumatic brain injury. Neurosurgery was called  And recommended the patient be transferred to [place]  Patient evaluated by Physical Therapy with no further acute PT needs identified. All education has been completed and the patient has no further questions.  See below for any follow-up Physial Therapy or equipment needs. PT is signing off. Thank you for this referral.     Follow Up Recommendations No PT follow up;Supervision for mobility/OOB    Equipment Recommendations  None recommended by PT    Recommendations for Other Services       Precautions / Restrictions Precautions Precautions: Fall      Mobility  Bed Mobility Overal bed mobility: Needs Assistance Bed Mobility: Supine to Sit     Supine to sit: Min assist     General bed mobility comments: truncal assist only  Transfers Overall transfer level: Needs assistance   Transfers: Sit to/from Stand Sit to Stand: Min guard         General transfer comment: min guard for safety  Ambulation/Gait Ambulation/Gait assistance: Min guard Ambulation Distance (Feet): 360 Feet Assistive device: Rolling walker (2 wheeled) Gait Pattern/deviations: Step-through pattern Gait velocity: slow Gait velocity interpretation: Below normal speed for age/gender General Gait Details: slow and steady with some dyspnea,  sats in the mid to low 90's (with 3 hort rests when HR got into 110's)  Stairs            Wheelchair Mobility    Modified Rankin (Stroke Patients Only)       Balance Overall  balance assessment: No apparent balance deficits (not formally assessed);Needs assistance Sitting-balance support: Feet supported;No upper extremity supported Sitting balance-Leahy Scale: Fair     Standing balance support: During functional activity;Bilateral upper extremity supported;No upper extremity supported Standing balance-Leahy Scale: Fair                       Pertinent Vitals/Pain sats were in range of 93 to 98% throughout  (when sensor read well)  Max EHR up to 118 bpm.  Some dyspnea, but mostly mild.    Home Living Family/patient expects to be discharged to:: Private residence Living Arrangements: Spouse/significant other Available Help at Discharge: Family Type of Home: House Home Access: Stairs to enter Entrance Stairs-Rails: Psychiatric nurse of Steps: 2 Home Layout: One level [place]: Environmental consultant - 2 wheels      Prior Function Level of Independence: Independent with assistive device(s)               Hand Dominance        Extremity/Trunk Assessment   Upper Extremity Assessment: Defer to OT evaluation           Lower Extremity Assessment: Overall WFL for tasks assessed         Communication   Communication: No difficulties  Cognition Arousal/Alertness: Awake/alert   Overall Cognitive Status: Within Functional Limits for tasks assessed                      General Comments  Exercises        Assessment/Plan    PT Assessment Patent does not need any further PT services  PT Diagnosis     PT Problem List    PT Treatment Interventions     PT Goals (Current goals can be found in the Care Plan section) Acute Rehab PT Goals Patient Stated Goal: get home as soon as possible PT Goal Formulation: No goals set, d/c therapy Time For Goal Achievement: 12/21/13    Frequency     Barriers to discharge        End of Session Equipment Utilized During Treatment: Oxygen Activity Tolerance: Patient  tolerated treatment well Patient left: in chair;with call bell/phone within reach;with nursing/sitter in room         Time: 1128-1153 PT Time Calculation (min): 25 min   Charges:   PT Evaluation $Initial PT Evaluation Tier I: 1 Procedure PT Treatments $Gait Training: 8-22 mins   PT G Codes:          Medora Roorda, Tessie Fass 12/21/2013, 12:26 PM 12/21/2013  Donnella Sham, PT [phone]  (pager)

## 2013-12-21 NOTE — Progress Notes (Signed)
Inpatient Diabetes Program Recommendations  AACE/ADA: New Consensus Statement on Inpatient Glycemic Control (2013)  Target Ranges:  Prepandial:   less than 140 mg/dL      Peak postprandial:   less than 180 mg/dL (1-2 hours)      Critically ill patients:  140 - 180 mg/dL   Reason for Assessment: Hyperglycemia  Diabetes history: Type 2 diabetes Outpatient Diabetes medications: 70/30 10 units q am, 35 units q pm, Glucophage 500 mg bid Current orders for Inpatient glycemic control: Moderate Novolog correction tid   Note: Patient on premixed insulin prior to admission-- covers both basal needs and food.  Has a diet order starting today- ate 90% breakfast.  Request MD :  Begin Lantus or Levemir 20 to 25 units daily or at HS  Novolog 4 units tid with meals-- to be held if patient eats <50% or CBG < 80 mg/dl  Order A1C to assess glycemic control.  (Could glycemic control issues have contributed to fall?) Thank you.  Blessed [place] S. Marcelline Mates, RN, MSN, CDE

## 2013-12-21 NOTE — Consult Note (Addendum)
Admit date: 12/20/2013 Referring Physician  Dr. Lindie Spruce Primary Physician Colette Ribas, MD Primary Cardiologist  Dr. Erlene Quan Reason for Consultation  subarachnoid hemorrhage, mechanical aortic valve, fall/possible syncope  HPI: 68 year-old female with mechanical aortic valve on chronic Coumadin therapy, prior bypass surgery/CAD, hypertension, diabetes who was admitted with fall to the back of the head. Originally, it was documented that she had the urgent need to have a bowel movement and on her way to the bathroom she passed out falling and striking her head. In talking with her now, it sounds like she was rushing from the carport to the house and has a small step or two which she likely slipped on and fell backward on to the concrete car port. Her husband was in the house and heard her say "Oh my".  Originally, she was unsure she had lost consciousness before the fall or as a result of the fall. She was transferred to [place] because of scalp laceration as well as subarachnoid bleed. FFP as well as vitamin K have been administered. INR is currently 1.59.   Currently sitting up in chair, feeling well. Walking. Clear.   Mechanical aortic valve was placed in November of 2011, 21 mm St. Jude. She has a long-standing history of right bundle branch block.  PMH:   Past Medical History  Diagnosis Date  . Diabetes mellitus   . Hypertension   . High cholesterol   . Breast cancer 11/2003  . Colonic adenoma   . OSA (obstructive sleep apnea)     AHI 45.0/hr, REM sleep was 231 mins.sleep was 77% and 84%, RDI was 21.5/hr total sleep was 2h 58 mins  . Hx of echocardiogram 04/28/2012    EF>55%, Moderate to severe mitral annular calcification, mild mitral regurgitation, mild mitral stenosis, mild tricuspid regurgitation, the prosthetic aortic valve is not well visualized, 21 mm St Jude Regent michanical valve there is aortic root sclerosis/calcification. midcavity gradient.  . History  of stress test 04/2008    Normal myocardial perfusion scan demonstrating an attenuationartifact in the anterior region of the myocardium. No ischemia or infarct/scar is seen in the remaining myocardium. compared to the previous study, there is no significant change. No significant ischemia demonstrated. this is a low risk study.  . Aortic stenosis   . H/O aortic valve replacement     PSH:   Past Surgical History  Procedure Laterality Date  . Coronary artery bypass graft    . Breast surgery  12/16/2003  . Aortic valve replacement  08/01/10    By Dr Rexanne Mano, St Jude AVR with 21 mm St Jude regent mechanical valve  . Colonoscopy  03/04/2003    Dr. Jena Gauss- internal hemorrhoids o/w normal rectum, scattered L sided diveritula  . Colonoscopy  04/12/2009    Dr. Frankey Poot polyp, L dided diverticula, tubular adenoma on bx  . Colonoscopy N/A 01/06/2013    RMR; multiple colonic/rectal polyps s/p polypectomy. Fragments of tubular adenomas, needs surveillance Oct 2014  . Colonoscopy N/A 01/18/2013    FEX:MDYJWL polypectomy site with bleeding, s/p bleeding control therapy  . Cardiac catheterization  05/2010    Normal coronary arteries angiographically, normal left ventricular functionultrasonographically and severe aortic stenosis by right cath. Optimal therapy would be elective aortic valve replacement   Allergies:  Demerol Prior to Admit Meds:   Prior to Admission medications   Medication Sig Start Date End Date Taking? Authorizing Provider  citalopram (CELEXA) 40 MG tablet Take 1 tablet by  mouth every evening. 04/05/13  Yes Historical Provider, MD  cyanocobalamin (,VITAMIN B-12,) 1000 MCG/ML injection Inject 1,000 mcg into the muscle every 30 (thirty) days.   Yes Historical Provider, MD  FIBER SELECT GUMMIES CHEW Chew 1 tablet by mouth 2 (two) times daily.   Yes Historical Provider, MD  fluticasone (FLONASE) 50 MCG/ACT nasal spray Place 2 sprays into the nose daily as needed for allergies or  rhinitis.    Yes Historical Provider, MD  furosemide (LASIX) 20 MG tablet Take 10 mg by mouth every morning.  12/01/13  Yes Historical Provider, MD  insulin aspart protamine- aspart (NOVOLOG MIX 70/30) (70-30) 100 UNIT/ML injection Inject 10-35 Units into the skin 2 (two) times daily with a meal. Takes 10 units in the evening and 35 units in the morning   Yes Historical Provider, MD  loratadine (CLARITIN) 10 MG tablet Take 10 mg by mouth every morning.   Yes Historical Provider, MD  metFORMIN (GLUCOPHAGE) 500 MG tablet Take 500 mg by mouth 2 (two) times daily with a meal.   Yes Historical Provider, MD  mupirocin ointment (BACTROBAN) 2 % Apply 1 application topically daily as needed.  10/21/13  Yes Historical Provider, MD  omeprazole (PRILOSEC) 20 MG capsule Take 20 mg by mouth every morning. 11/03/13  Yes Orvil Feil, NP  warfarin (COUMADIN) 5 MG tablet Take 7.5 mg by mouth every evening. Takes one and one half tablet daily in the evening   Yes Historical Provider, MD   Fam HX:    Family History  Problem Relation Age of Onset  . Heart failure Mother   . CAD    . Diabetes     Social HX:    History   Social History  . Marital Status: Married    Spouse Name: N/A    Number of Children: N/A  . Years of Education: N/A   Occupational History  . Not on file.   Social History Main Topics  . Smoking status: Never Smoker   . Smokeless tobacco: Never Used  . Alcohol Use: No  . Drug Use: No  . Sexual Activity: Not on file   Other Topics Concern  . Not on file   Social History Narrative  . No narrative on file     ROS:  Mild dyspnea over the past few weeks after 10 pound weight gain with insulin. All 11 ROS were addressed and are negative except what is stated in the HPI   Physical Exam: Blood pressure 119/62, pulse 95, temperature 98.6 F (37 C), temperature source Oral, resp. rate 15, height 5' 3.5" (1.613 m), weight 268 lb 15.4 oz (122 kg), SpO2 92.00%.   General: Well developed,  well nourished, in no acute distress Head: Head wound noted  Lungs:   Clear bilaterally to auscultation and percussion. Normal respiratory effort. No wheezes, no rales. Heart:   HRRR S1 S2 click, mechanical Pulses are 2+ & equal. 2/6 systolic murmur RUSB,no rubs, gallops.  No carotid bruit. No JVD.  No abdominal bruits.  Abdomen: Bowel sounds are positive, abdomen soft and non-tender without masses. No hepatosplenomegaly. Obese Msk:  Back normal. Normal strength and tone for age. Extremities:  No clubbing, cyanosis or edema.  DP +1 Neuro: Alert and oriented X 3, non-focal, MAE x 4 GU: Deferred Rectal: Deferred Psych:  Good affect, responds appropriately      Labs: Lab Results  Component Value Date   WBC 12.0* 12/21/2013   HGB 9.3* 12/21/2013   HCT 28.3*  12/21/2013   MCV 76.9* 12/21/2013   PLT 86* 12/21/2013     Recent Labs Lab 12/21/13 0400  NA 142  K 5.2  CL 107  CO2 23  BUN 31*  CREATININE 1.49*  CALCIUM 8.4  GLUCOSE 289*     Radiology:  Dg Wrist 2 Views Right  12/21/2013   CLINICAL DATA:  Pain and swelling.  EXAM: RIGHT WRIST - 2 VIEW  COMPARISON:  None.  FINDINGS: Diffuse mild soft tissue swelling noted. No acute bony abnormality identified. Radiocarpal degenerative change present.  IMPRESSION: 1. Diffuse soft tissue swelling, no acute bony abnormality. 2. Radiocarpal degenerative change.   Electronically Signed   By: Marcello Moores  Register   On: 12/21/2013 10:31   Ct Head Without Contrast  12/21/2013   CLINICAL DATA:  Followup traumatic brain injury.  EXAM: CT HEAD WITHOUT CONTRAST  TECHNIQUE: Contiguous axial images were obtained from the base of the skull through the vertex without contrast.  COMPARISON:  Head CT earlier in the day.  FINDINGS: Large scalp hematoma with lacerations in the right and left paramedian occipital region have been stapled closed. Considerable subcutaneous air. No underlying skull fracture.  Left frontal contusion suspected with overlying subarachnoid  blood in the left frontal sulci. There appears to be slightly more subarachnoid blood and slightly increased parenchymal contusion compared with priors.  Less impressive is right parieto-occipital subarachnoid blood, stable from earlier today. Mild atrophy.  IMPRESSION: Slight worsening left frontal contusion and left frontal subarachnoid blood. Stable right parieto-occipital subarachnoid collection.  Occipital lacerations have been stapled. No underlying skull fracture.   Electronically Signed   By: Rolla Flatten M.D.   On: 12/21/2013 08:02   Ct Head Wo Contrast  12/20/2013   CLINICAL DATA:  Trauma.  EXAM: CT HEAD WITHOUT CONTRAST  CT CERVICAL SPINE WITHOUT CONTRAST  TECHNIQUE: Multidetector CT imaging of the head and cervical spine was performed following the standard protocol without intravenous contrast. Multiplanar CT image reconstructions of the cervical spine were also generated.  COMPARISON:  07/19/2011 CT head. 11/16/2007 head CT and facial CT. No comparison cervical spine CT.  FINDINGS: CT HEAD FINDINGS  Large posterior parietal -occipital subcutaneous hematoma without underlying fracture.  Scattered subarachnoid blood left frontal lobe gyri and right parietal-occipital lobe medially located gyri.  No CT evidence of large acute thrombotic infarct.  No hydrocephalus.  No intracranial mass lesion noted on this unenhanced exam.  CT CERVICAL SPINE FINDINGS  Examination is slightly limited by patient's habitus. No cervical spine fracture is detected.  Straightening of the cervical spine may be related to head position or spasm. If there is a high clinical suspicion of ligamentous injury, flexion and extension views or MR can be performed for further delineation.  Prominent calcification palatini tonsils.  IMPRESSION: CT HEAD:  Large posterior parietal -occipital subcutaneous hematoma without underlying fracture.  Scattered subarachnoid blood left frontal lobe gyri and right parietal-occipital lobe medially  located gyri.  CT CERVICAL SPINE:  Examination is slightly limited by patient's habitus.  No cervical spine fracture is detected.  Straightening of the cervical spine may be related to head position or spasm. If there is a high clinical suspicion of ligamentous injury, flexion and extension views or MR can be performed for further delineation.  These results were called by telephone at the time of interpretation on 12/20/2013 at 9:06 PM to Dr. Virgel Manifold , who verbally acknowledged these results.   Electronically Signed   By: Chauncey Cruel M.D.   On: 12/20/2013  21:16   Ct Cervical Spine Wo Contrast  12/20/2013   CLINICAL DATA:  Trauma.  EXAM: CT HEAD WITHOUT CONTRAST  CT CERVICAL SPINE WITHOUT CONTRAST  TECHNIQUE: Multidetector CT imaging of the head and cervical spine was performed following the standard protocol without intravenous contrast. Multiplanar CT image reconstructions of the cervical spine were also generated.  COMPARISON:  07/19/2011 CT head. 11/16/2007 head CT and facial CT. No comparison cervical spine CT.  FINDINGS: CT HEAD FINDINGS  Large posterior parietal -occipital subcutaneous hematoma without underlying fracture.  Scattered subarachnoid blood left frontal lobe gyri and right parietal-occipital lobe medially located gyri.  No CT evidence of large acute thrombotic infarct.  No hydrocephalus.  No intracranial mass lesion noted on this unenhanced exam.  CT CERVICAL SPINE FINDINGS  Examination is slightly limited by patient's habitus. No cervical spine fracture is detected.  Straightening of the cervical spine may be related to head position or spasm. If there is a high clinical suspicion of ligamentous injury, flexion and extension views or MR can be performed for further delineation.  Prominent calcification palatini tonsils.  IMPRESSION: CT HEAD:  Large posterior parietal -occipital subcutaneous hematoma without underlying fracture.  Scattered subarachnoid blood left frontal lobe gyri and  right parietal-occipital lobe medially located gyri.  CT CERVICAL SPINE:  Examination is slightly limited by patient's habitus.  No cervical spine fracture is detected.  Straightening of the cervical spine may be related to head position or spasm. If there is a high clinical suspicion of ligamentous injury, flexion and extension views or MR can be performed for further delineation.  These results were called by telephone at the time of interpretation on 12/20/2013 at 9:06 PM to Dr. Virgel Manifold , who verbally acknowledged these results.   Electronically Signed   By: Chauncey Cruel M.D.   On: 12/20/2013 21:16   Dg Hand 2 View Right  12/21/2013   CLINICAL DATA:  Swelling and pain.  EXAM: RIGHT HAND - 2 VIEW  COMPARISON:  None.  FINDINGS: Mild diffuse soft-tissue swelling. Diffuse degenerative changes noted about the hand and wrist particularly the radiocarpal joint. No acute bony or joint abnormality. Bony mineralization is normal. No periarticular osteopenia. No erosive arthropathy.  IMPRESSION: Degenerative changes noted diffusely about the hand and wrist, particularly at the radiocarpal joint. No acute or focal abnormality. No evidence of erosive arthropathy.   Electronically Signed   By: Marcello Moores  Register   On: 12/21/2013 10:25     EKG:  04/14/13-sinus rhythm, right bundle branch block, no other abnormalities. Personally viewed.   ECHO: 05/05/13: - Left ventricle: The cavity size was normal. Wall thickness was increased in a pattern of moderate LVH. There was severe concentric hypertrophy. Systolic function was vigorous with near mid cavitary obliteration.The estimated ejection fraction was in the range of 65% to 70%. Wall motion was normal; there were no regional wall motion abnormalities. Doppler parameters are consistent with abnormal left ventricular relaxation (grade 1 diastolic dysfunction). Doppler parameters are consistent with elevated mean left atrial filling pressure. - Aortic valve: A 21  mm mechanical St Jude Regent prosthesis was present. Trivial regurgitation. Valve area: 2.28cm^2(VTI). Valve area: 1.99cm^2 (Vmax). - Mitral valve: Moderately to severely calcified annulus. Moderately calcified leaflets . The findings are consistent with mild stenosis. Valve area by continuity equation (using LVOT flow): 2.61cm^2. - Left atrium: The atrium was severely dilated. - Atrial septum: No defect or patent foramen ovale was identified.  ASSESSMENT/PLAN:   68 year-old female with mechanical aortic valve  on chronic anticoagulation with Coumadin, prior bypass surgery , diabetes here with fall, likely mechanical resulting in scalp laceration with CT scan demonstrating subarachnoid hemorrhage.  1. Mechanical aortic valve/chronic anticoagulation-obviously with evidence of subarachnoid hemorrhage, Coumadin is on hold. She has been given reversal agents, FFP, vitamin K. Mechanical aortic valve in aortic position can go for several days without anticoagulation unlike mechanical mitral valve. Obviously, her subarachnoid hemorrhage dictates when anticoagulation can be resumed. This will need to be determined by surgical/neuro team. Coumadin should be resumed when felt safe to do so to help reduce use risk of embolic phenomenon as a result of mechanical prosthesis. She and her husband understand the clinical dilemma.    2. Fall-may not be syncope. Could have been a mechanical fall. I will check an EKG. She does have underlying conduction disorder with right bundle branch block. Also given the emergency Department history of urgent need for bowel movement sometimes points towards a vasovagal etiology which can lead to syncope. Continue to monitor on tele.  3. Diabetes-per primary team.  4. Coronary artery disease-coronary artery bypass graft is listed in her past surgical history. TCT postop office visit with Dr. Cyndia Bent mentions only mechanical aortic valve. There are no operative reports in the  computer system currently. She remembers being told that her coronary arteries looked normal after cath. I will remove from history.   Candee Furbish, MD  12/21/2013  11:46 AM

## 2013-12-22 ENCOUNTER — Ambulatory Visit: Payer: Medicare Other | Admitting: Cardiology

## 2013-12-22 DIAGNOSIS — S066X9A Traumatic subarachnoid hemorrhage with loss of consciousness of unspecified duration, initial encounter: Secondary | ICD-10-CM

## 2013-12-22 DIAGNOSIS — R42 Dizziness and giddiness: Secondary | ICD-10-CM

## 2013-12-22 DIAGNOSIS — S066XAA Traumatic subarachnoid hemorrhage with loss of consciousness status unknown, initial encounter: Secondary | ICD-10-CM

## 2013-12-22 LAB — PREPARE FRESH FROZEN PLASMA
Unit division: 0
Unit division: 0

## 2013-12-22 LAB — GLUCOSE, CAPILLARY
GLUCOSE-CAPILLARY: 233 mg/dL — AB (ref 70–99)
GLUCOSE-CAPILLARY: 201 mg/dL — AB (ref 70–99)

## 2013-12-22 MED ORDER — INSULIN DETEMIR 100 UNIT/ML ~~LOC~~ SOLN
15.0000 [IU] | Freq: Every day | SUBCUTANEOUS | Status: DC
  Filled 2013-12-22: qty 0.15

## 2013-12-22 MED ORDER — SODIUM CHLORIDE 0.9 % IJ SOLN: 3.0000 mL | INTRAMUSCULAR | Status: DC | PRN

## 2013-12-22 MED ORDER — ACETAMINOPHEN 325 MG PO TABS: 650.0000 mg | ORAL_TABLET | ORAL | Status: DC | PRN

## 2013-12-22 NOTE — Discharge Summary (Signed)
Physician Discharge Summary  Patient ID: Hailey Curry MRN: [id number] DOB/AGE: August 09, 1946 68 y.o.  Admit date: 12/20/2013 Discharge date: 12/22/2013  Discharge Diagnoses Patient Active Problem List   Diagnosis Date Noted  . Traumatic subarachnoid hemorrhage 12/21/2013  . Fall 12/21/2013  . Scalp laceration 12/21/2013  . Chronic anemia 12/21/2013  . Abdominal pain, unspecified site 10/23/2013  . Abdominal discomfort 06/16/2013  . Acute blood loss anemia 01/18/2013  . GI bleed 01/18/2013  . Acute renal failure 01/18/2013  . DM (diabetes mellitus) 01/18/2013  . Hypertension   . Colonic adenoma   . Long term (current) use of anticoagulants 12/04/2012  . S/P AVR (aortic valve replacement) 12/04/2012    Consultants Dr. Kristeen Miss for neurosurgery  Dr. Candee Furbish for cardiology   Procedures 3/23 -- Complex repair of scalp laceration by Dr. Coralie Keens   HPI: Hailey Curry fell and hit the back of her head. It was initially reported as syncopal but was later determined to just be a mechanical fall. There was a brief loss of consciousness. She is anticoagulated on coumadin. She was taken to [facility] A CT scan of the brain showed a traumatic brain injury. Neurosurgery was called and recommended the patient be transferred to [facility]. She had a large scalp laceration and hematoma which could not be repaired by the emergency room physician. A pressure dressing was applied and she was transferred. Her scalp laceration was closed in the ED emergently to achieve hemostasis. She was admitted by the trauma service to the ICU.   Hospital Course: Neurosurgery recommended non-operative treatment and a repeat head CT the following day. This was stable and the patient remained neurologically normal with the exception of a mild headache for the duration of her hospitalization. She received vitamin K and fresh frozen plasma to reverse her anticoagulation. Cardiology was  consulted for the possible syncope and to give guidance with respect to her anticoagulation. She was mobilized with physical therapy and did well. Neurosurgery recommended holding anticoagulation for a week. She was discharged home in good condition in the care of her husband.       Medication List    STOP taking these medications       warfarin 5 MG tablet  Commonly known as:  COUMADIN      TAKE these medications       acetaminophen 325 MG tablet  Commonly known as:  TYLENOL  Take 2 tablets (650 mg total) by mouth every 4 (four) hours as needed for headache.     citalopram 40 MG tablet  Commonly known as:  CELEXA  Take 1 tablet by mouth every evening.     cyanocobalamin 1000 MCG/ML injection  Commonly known as:  (VITAMIN B-12)  Inject 1,000 mcg into the muscle every 30 (thirty) days.     FIBER SELECT GUMMIES Chew  Chew 1 tablet by mouth 2 (two) times daily.     fluticasone 50 MCG/ACT nasal spray  Commonly known as:  FLONASE  Place 2 sprays into the nose daily as needed for allergies or rhinitis.     furosemide 20 MG tablet  Commonly known as:  LASIX  Take 10 mg by mouth every morning.     insulin aspart protamine- aspart (70-30) 100 UNIT/ML injection  Commonly known as:  NOVOLOG MIX 70/30  Inject 10-35 Units into the skin 2 (two) times daily with a meal. Takes 10 units in the evening and 35 units in the morning  loratadine 10 MG tablet  Commonly known as:  CLARITIN  Take 10 mg by mouth every morning.     metFORMIN 500 MG tablet  Commonly known as:  GLUCOPHAGE  Take 500 mg by mouth 2 (two) times daily with a meal.     mupirocin ointment 2 %  Commonly known as:  BACTROBAN  Apply 1 application topically daily as needed.     omeprazole 20 MG capsule  Commonly known as:  PRILOSEC  Take 20 mg by mouth every morning.             Follow-up Information   Follow up with Lorretta Harp, MD. Schedule an appointment as soon as possible for a visit on  12/27/2013.   Specialty:  Cardiology   Contact information:   [address] [place] [address] [phone]       Call Earleen Newport, MD. (As needed)    Specialty:  Neurosurgery   Contact information:   [address]  [place] [address] [phone]       Call [place]. (As needed)    Contact information:   [address] [place] [address] [phone]        Signed: Lisette Abu, PA-C Pager: [phone] General Trauma PA Pager: [phone] 12/22/2013, 2:00 PM

## 2013-12-22 NOTE — Discharge Summary (Signed)
Will restart coumadin in one week.

## 2013-12-22 NOTE — Discharge Instructions (Signed)
Do not restart coumadin until you see Dr. Gwenlyn Found.

## 2013-12-22 NOTE — Progress Notes (Addendum)
Pt overview:  HPI: 68 year-old female with mechanical aortic valve on chronic Coumadin therapy, prior bypass surgery/CAD, hypertension, diabetes who was admitted with fall to the back of the head and subarachnoid hemorrhage. Possible syncope. Admit date 03/24   Subjective:  Hailey Curry reports feeling "much better" today.  She has been up ad lib, and feels like she can walk some more today without issues.  She reports mild SOB, however states that has been present for "several weeks" leading up to this fall.  She denies chest pain, dizziness, N/V.    HPI:  Originally, it was documented that she had the urgent need to have a bowel movement and on her way to the bathroom she passed out falling and striking her head.   In talking with her now, it sounds like she was rushing from the carport to the house and has a small step or two which she likely slipped on and fell backward on to concrete.   Her husband was in the house and heard her say "Oh my".   Originally, she was unsure she had lost consciousness before the fall or as a result of the fall. She was transferred to [facility] because of scalp laceration as well as subarachnoid bleed. FFP as well as vitamin K have been administered. INR is currently 1.59.  Objective:  Vital Signs in the last 24 hours: Temp:  [97.8 F (36.6 C)-98.4 F (36.9 C)] 97.8 F (36.6 C) (03/25 0726) Pulse Rate:  [58-109] 78 (03/25 0900) Resp:  [11-27] 20 (03/25 0900) BP: (98-133)/(30-95) 124/30 mmHg (03/25 0900) SpO2:  [83 %-100 %] 94 % (03/25 0900)  Intake/Output from previous day: 03/24 0701 - 03/25 0700 In: 877.5 [P.O.:660; I.V.:167.5; IV Piggyback:50] Out: 1100 [Urine:1100] Intake/Output from this shift: Total I/O In: 150 [P.O.:150] Out: 800 [Urine:800]  Physical Exam: General appearance: alert, cooperative and no distress Neck: no carotid bruit, no JVD and supple, symmetrical, trachea midline Lungs: clear to auscultation  bilaterally Heart: regular rate and rhythm, S2: increased intensity, normal prosthetic S2 and systolic murmur: early systolic 3/6, blowing at 2nd left intercostal space Abdomen: soft, non-tender; bowel sounds normal; no masses,  no organomegaly Extremities: Homans sign is negative, no sign of DVT and no edema, redness or tenderness in the calves or thighs Pulses: Left Dorsalis Pedis present 2+ Right Dorsalis Pedis present 2+ Radial pulses 2+ bilaterally  Lab Results:  Recent Labs  12/21/13 0130 12/21/13 0400  WBC 15.6* 12.0*  HGB 9.8* 9.3*  PLT 103* 86*    Recent Labs  12/20/13 2034 12/21/13 0400  NA 139 142  K 4.3 5.2  CL 102 107  CO2 27 23  GLUCOSE 206* 289*  BUN 29* 31*  CREATININE 1.26* 1.49*   Lab Results  Component Value Date   INR 1.59* 12/21/2013   INR 3.62* 12/20/2013    Imaging: Dg Wrist 2 Views Right 12/21/2013   CLINICAL DATA:  Pain and swelling.  EXAM: RIGHT WRIST - 2 VIEW  COMPARISON:  None.  FINDINGS: Diffuse mild soft tissue swelling noted. No acute bony abnormality identified. Radiocarpal degenerative change present.  IMPRESSION: 1. Diffuse soft tissue swelling, no acute bony abnormality. 2. Radiocarpal degenerative change.   Electronically Signed   By: Marcello Moores  Register   On: 12/21/2013 10:31   Ct Head Without Contrast 12/21/2013   CLINICAL DATA:  Followup traumatic brain injury.  EXAM: CT HEAD WITHOUT CONTRAST  TECHNIQUE: Contiguous axial images were obtained from the base of  the skull through the vertex without contrast.  COMPARISON:  Head CT earlier in the day.  FINDINGS: Large scalp hematoma with lacerations in the right and left paramedian occipital region have been stapled closed. Considerable subcutaneous air. No underlying skull fracture.  Left frontal contusion suspected with overlying subarachnoid blood in the left frontal sulci. There appears to be slightly more subarachnoid blood and slightly increased parenchymal contusion compared with priors.   Less impressive is right parieto-occipital subarachnoid blood, stable from earlier today. Mild atrophy.  IMPRESSION: Slight worsening left frontal contusion and left frontal subarachnoid blood. Stable right parieto-occipital subarachnoid collection.  Occipital lacerations have been stapled. No underlying skull fracture.   Electronically Signed   By: Rolla Flatten M.D.   On: 12/21/2013 08:02   Ct Head Wo Contrast 12/20/2013   CLINICAL DATA:  Trauma.  EXAM: CT HEAD WITHOUT CONTRAST  CT CERVICAL SPINE WITHOUT CONTRAST  TECHNIQUE: Multidetector CT imaging of the head and cervical spine was performed following the standard protocol without intravenous contrast. Multiplanar CT image reconstructions of the cervical spine were also generated.  COMPARISON:  07/19/2011 CT head. 11/16/2007 head CT and facial CT. No comparison cervical spine CT.  FINDINGS: CT HEAD FINDINGS  Large posterior parietal -occipital subcutaneous hematoma without underlying fracture.  Scattered subarachnoid blood left frontal lobe gyri and right parietal-occipital lobe medially located gyri.  No CT evidence of large acute thrombotic infarct.  No hydrocephalus.  No intracranial mass lesion noted on this unenhanced exam.  CT CERVICAL SPINE FINDINGS  Examination is slightly limited by patient's habitus. No cervical spine fracture is detected.  Straightening of the cervical spine may be related to head position or spasm. If there is a high clinical suspicion of ligamentous injury, flexion and extension views or MR can be performed for further delineation.  Prominent calcification palatini tonsils.  IMPRESSION: CT HEAD:  Large posterior parietal -occipital subcutaneous hematoma without underlying fracture.  Scattered subarachnoid blood left frontal lobe gyri and right parietal-occipital lobe medially located gyri.  CT CERVICAL SPINE:  Examination is slightly limited by patient's habitus.  No cervical spine fracture is detected.  Straightening of the  cervical spine may be related to head position or spasm. If there is a high clinical suspicion of ligamentous injury, flexion and extension views or MR can be performed for further delineation.  These results were called by telephone at the time of interpretation on 12/20/2013 at 9:06 PM to Dr. Virgel Manifold , who verbally acknowledged these results.   Electronically Signed   By: Chauncey Cruel M.D.   On: 12/20/2013 21:16   Ct Cervical Spine Wo Contrast 12/20/2013   CLINICAL DATA:  Trauma.  EXAM: CT HEAD WITHOUT CONTRAST  CT CERVICAL SPINE WITHOUT CONTRAST  TECHNIQUE: Multidetector CT imaging of the head and cervical spine was performed following the standard protocol without intravenous contrast. Multiplanar CT image reconstructions of the cervical spine were also generated.  COMPARISON:  07/19/2011 CT head. 11/16/2007 head CT and facial CT. No comparison cervical spine CT.  FINDINGS: CT HEAD FINDINGS  Large posterior parietal -occipital subcutaneous hematoma without underlying fracture.  Scattered subarachnoid blood left frontal lobe gyri and right parietal-occipital lobe medially located gyri.  No CT evidence of large acute thrombotic infarct.  No hydrocephalus.  No intracranial mass lesion noted on this unenhanced exam.  CT CERVICAL SPINE FINDINGS  Examination is slightly limited by patient's habitus. No cervical spine fracture is detected.  Straightening of the cervical spine may be related to head position  or spasm. If there is a high clinical suspicion of ligamentous injury, flexion and extension views or MR can be performed for further delineation.  Prominent calcification palatini tonsils.  IMPRESSION: CT HEAD:  Large posterior parietal -occipital subcutaneous hematoma without underlying fracture.  Scattered subarachnoid blood left frontal lobe gyri and right parietal-occipital lobe medially located gyri.  CT CERVICAL SPINE:  Examination is slightly limited by patient's habitus.  No cervical spine fracture  is detected.  Straightening of the cervical spine may be related to head position or spasm. If there is a high clinical suspicion of ligamentous injury, flexion and extension views or MR can be performed for further delineation.  These results were called by telephone at the time of interpretation on 12/20/2013 at 9:06 PM to Dr. Virgel Manifold , who verbally acknowledged these results.   Electronically Signed   By: Chauncey Cruel M.D.   On: 12/20/2013 21:16   Dg Hand 2 View Right 12/21/2013   CLINICAL DATA:  Swelling and pain.  EXAM: RIGHT HAND - 2 VIEW  COMPARISON:  None.  FINDINGS: Mild diffuse soft-tissue swelling. Diffuse degenerative changes noted about the hand and wrist particularly the radiocarpal joint. No acute bony or joint abnormality. Bony mineralization is normal. No periarticular osteopenia. No erosive arthropathy.  IMPRESSION: Degenerative changes noted diffusely about the hand and wrist, particularly at the radiocarpal joint. No acute or focal abnormality. No evidence of erosive arthropathy.   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  No acute injury or ectopy noted.   Scheduled Meds: . furosemide  10 mg Oral q morning - 10a  . insulin aspart  0-15 Units Subcutaneous TID WC  . insulin detemir  15 Units Subcutaneous Daily  . pantoprazole  40 mg Oral Daily   Or  . pantoprazole (PROTONIX) IV  40 mg Intravenous Daily  PRN Meds:.acetaminophen, morphine injection, morphine injection, morphine injection, ondansetron (ZOFRAN) IV, ondansetron, oxyCODONE-acetaminophen, sodium chloride  Assessment/Plan:  Active Problems:   Long term (current) use of anticoagulants   Acute blood loss anemia   Traumatic subarachnoid hemorrhage   Fall   Scalp laceration   Chronic anemia  Possible Syncope - History now available from the husband makes syncope less  likely. Possible vasovagal contribution. Not on rate-lowering Rx PTA, BP and CBG OK on admission. No history of syncope or presyncope. MD advise on event monitor. HR/BP stable, no evidence of vasovagal syncope since admission.  F/u with Dr. Gwenlyn Found out pt.    Subarachnoid hemorrhage -Coumadin on hold per Neuro recs, restart when OK w/ neuro   LOS: 2 days    Hailey Curry 12/22/2013, 9:56 AM  Agree, w/ changes made. Hailey Ferries, PA-C 12/22/2013 10:35 AM Beeper [phone]  I have personally seen and examined this patient with Hailey Ferries, PA-C. I agree with the assessment and plan as outlined above. Coumadin is on hold and recommendations are to hold for one week per surgery teams. She will need f/u on Monday or Tuesday next week with Dr. Gwenlyn Found or Pa in [place] office. Being discharged home today.   Hailey Curry 12/22/2013 11:41 AM

## 2013-12-22 NOTE — Progress Notes (Signed)
Trauma Service Note  Subjective: Patient doing well.  Sitting up in chair in minimal distress  Objective: Vital signs in last 24 hours: Temp:  [97.8 F (36.6 C)-98.4 F (36.9 C)] 97.8 F (36.6 C) (03/25 0726) Pulse Rate:  [83-109] 83 (03/25 0700) Resp:  [11-21] 12 (03/25 0700) BP: (98-158)/(37-95) 132/56 mmHg (03/25 0700) SpO2:  [83 %-100 %] 96 % (03/25 0700) Last BM Date: 12/20/13  Intake/Output from previous day: 03/24 0701 - 03/25 0700 In: 877.5 [P.O.:660; I.V.:167.5; IV Piggyback:50] Out: 1100 [Urine:1100] Intake/Output this shift: Total I/O In: -  Out: 800 [Urine:800]  General: No acute distress  Lungs: Clear to auscultation  Abd: Benign  Extremities: No DVT concerns  Neuro: Completely intact  Lab Results: CBC   Recent Labs  12/21/13 0130 12/21/13 0400  WBC 15.6* 12.0*  HGB 9.8* 9.3*  HCT 30.7* 28.3*  PLT 103* 86*   BMET  Recent Labs  12/20/13 2034 12/21/13 0400  NA 139 142  K 4.3 5.2  CL 102 107  CO2 27 23  GLUCOSE 206* 289*  BUN 29* 31*  CREATININE 1.26* 1.49*  CALCIUM 9.1 8.4   PT/INR  Recent Labs  12/20/13 2034 12/21/13 0506  LABPROT 34.7* 18.5*  INR 3.62* 1.59*   ABG No results found for this basename: PHART, PCO2, PO2, HCO3,  in the last 72 hours  Studies/Results: Dg Wrist 2 Views Right  12/21/2013   CLINICAL DATA:  Pain and swelling.  EXAM: RIGHT WRIST - 2 VIEW  COMPARISON:  None.  FINDINGS: Diffuse mild soft tissue swelling noted. No acute bony abnormality identified. Radiocarpal degenerative change present.  IMPRESSION: 1. Diffuse soft tissue swelling, no acute bony abnormality. 2. Radiocarpal degenerative change.   Electronically Signed   By: Marcello Moores  Register   On: 12/21/2013 10:31   Ct Head Without Contrast  12/21/2013   CLINICAL DATA:  Followup traumatic brain injury.  EXAM: CT HEAD WITHOUT CONTRAST  TECHNIQUE: Contiguous axial images were obtained from the base of the skull through the vertex without contrast.   COMPARISON:  Head CT earlier in the day.  FINDINGS: Large scalp hematoma with lacerations in the right and left paramedian occipital region have been stapled closed. Considerable subcutaneous air. No underlying skull fracture.  Left frontal contusion suspected with overlying subarachnoid blood in the left frontal sulci. There appears to be slightly more subarachnoid blood and slightly increased parenchymal contusion compared with priors.  Less impressive is right parieto-occipital subarachnoid blood, stable from earlier today. Mild atrophy.  IMPRESSION: Slight worsening left frontal contusion and left frontal subarachnoid blood. Stable right parieto-occipital subarachnoid collection.  Occipital lacerations have been stapled. No underlying skull fracture.   Electronically Signed   By: Rolla Flatten M.D.   On: 12/21/2013 08:02   Ct Head Wo Contrast  12/20/2013   CLINICAL DATA:  Trauma.  EXAM: CT HEAD WITHOUT CONTRAST  CT CERVICAL SPINE WITHOUT CONTRAST  TECHNIQUE: Multidetector CT imaging of the head and cervical spine was performed following the standard protocol without intravenous contrast. Multiplanar CT image reconstructions of the cervical spine were also generated.  COMPARISON:  07/19/2011 CT head. 11/16/2007 head CT and facial CT. No comparison cervical spine CT.  FINDINGS: CT HEAD FINDINGS  Large posterior parietal -occipital subcutaneous hematoma without underlying fracture.  Scattered subarachnoid blood left frontal lobe gyri and right parietal-occipital lobe medially located gyri.  No CT evidence of large acute thrombotic infarct.  No hydrocephalus.  No intracranial mass lesion noted on this unenhanced exam.  CT  CERVICAL SPINE FINDINGS  Examination is slightly limited by patient's habitus. No cervical spine fracture is detected.  Straightening of the cervical spine may be related to head position or spasm. If there is a high clinical suspicion of ligamentous injury, flexion and extension views or MR can  be performed for further delineation.  Prominent calcification palatini tonsils.  IMPRESSION: CT HEAD:  Large posterior parietal -occipital subcutaneous hematoma without underlying fracture.  Scattered subarachnoid blood left frontal lobe gyri and right parietal-occipital lobe medially located gyri.  CT CERVICAL SPINE:  Examination is slightly limited by patient's habitus.  No cervical spine fracture is detected.  Straightening of the cervical spine may be related to head position or spasm. If there is a high clinical suspicion of ligamentous injury, flexion and extension views or MR can be performed for further delineation.  These results were called by telephone at the time of interpretation on 12/20/2013 at 9:06 PM to Dr. Virgel Manifold , who verbally acknowledged these results.   Electronically Signed   By: Chauncey Cruel M.D.   On: 12/20/2013 21:16   Ct Cervical Spine Wo Contrast  12/20/2013   CLINICAL DATA:  Trauma.  EXAM: CT HEAD WITHOUT CONTRAST  CT CERVICAL SPINE WITHOUT CONTRAST  TECHNIQUE: Multidetector CT imaging of the head and cervical spine was performed following the standard protocol without intravenous contrast. Multiplanar CT image reconstructions of the cervical spine were also generated.  COMPARISON:  07/19/2011 CT head. 11/16/2007 head CT and facial CT. No comparison cervical spine CT.  FINDINGS: CT HEAD FINDINGS  Large posterior parietal -occipital subcutaneous hematoma without underlying fracture.  Scattered subarachnoid blood left frontal lobe gyri and right parietal-occipital lobe medially located gyri.  No CT evidence of large acute thrombotic infarct.  No hydrocephalus.  No intracranial mass lesion noted on this unenhanced exam.  CT CERVICAL SPINE FINDINGS  Examination is slightly limited by patient's habitus. No cervical spine fracture is detected.  Straightening of the cervical spine may be related to head position or spasm. If there is a high clinical suspicion of ligamentous injury,  flexion and extension views or MR can be performed for further delineation.  Prominent calcification palatini tonsils.  IMPRESSION: CT HEAD:  Large posterior parietal -occipital subcutaneous hematoma without underlying fracture.  Scattered subarachnoid blood left frontal lobe gyri and right parietal-occipital lobe medially located gyri.  CT CERVICAL SPINE:  Examination is slightly limited by patient's habitus.  No cervical spine fracture is detected.  Straightening of the cervical spine may be related to head position or spasm. If there is a high clinical suspicion of ligamentous injury, flexion and extension views or MR can be performed for further delineation.  These results were called by telephone at the time of interpretation on 12/20/2013 at 9:06 PM to Dr. Virgel Manifold , who verbally acknowledged these results.   Electronically Signed   By: Chauncey Cruel M.D.   On: 12/20/2013 21:16   Dg Hand 2 View Right  12/21/2013   CLINICAL DATA:  Swelling and pain.  EXAM: RIGHT HAND - 2 VIEW  COMPARISON:  None.  FINDINGS: Mild diffuse soft-tissue swelling. Diffuse degenerative changes noted about the hand and wrist particularly the radiocarpal joint. No acute bony or joint abnormality. Bony mineralization is normal. No periarticular osteopenia. No erosive arthropathy.  IMPRESSION: Degenerative changes noted diffusely about the hand and wrist, particularly at the radiocarpal joint. No acute or focal abnormality. No evidence of erosive arthropathy.   Electronically Signed   By: Marcello Moores  Register  On: 12/21/2013 10:25    Anti-infectives: Anti-infectives   None      Assessment/Plan: s/p  Discharge The neurosurgeon has seen her scans and will see her today.  More than likely she will be able to go home today. Not to resume her Coumadin for at least a week, or per the recommendations of the neurosurgeon.  LOS: 2 days   Hailey Curry. Dahlia Bailiff, MD, FACS [phone] Trauma Surgeon 12/22/2013

## 2013-12-22 NOTE — Consult Note (Signed)
Reason for Consult: Closed head injury Referring Physician: [name] is an 68 y.o. female.  HPI: Patient is a 68 year old individual who has had a fall onto the back of the head. She sustained a comminuted laceration of the occipital scalp. There was a brief loss of consciousness. CT scan demonstrated the presence of some subarachnoid blood in the sylvian fissure on the left side with a small left frontal contusion. There is some right parietal occipital subarachnoid blood also. Followup CT scan seems to show the blood a bit more clearly however the total volume of hemorrhage does not appear to have changed. Logically the patient has remained stable. She is on Coumadin anticoagulation for an aortic valve. Her anticoagulated status has been reversed. His been advised that this may continue for a week's period time but then the patient should resume anticoagulation at the earliest convenience.   Past Medical History  Diagnosis Date  . Diabetes mellitus   . Hypertension   . High cholesterol   . Breast cancer 11/2003  . Colonic adenoma   . OSA (obstructive sleep apnea)     AHI 45.0/hr, REM sleep was 231 mins.sleep was 77% and 84%, RDI was 21.5/hr total sleep was 2h 58 mins  . Hx of echocardiogram 04/28/2012    EF>55%, Moderate to severe mitral annular calcification, mild mitral regurgitation, mild mitral stenosis, mild tricuspid regurgitation, the prosthetic aortic valve is not well visualized, 21 mm St Jude Regent michanical valve there is aortic root sclerosis/calcification. 75mmhg midcavity gradient.  . History of stress test 04/2008    Normal myocardial perfusion scan demonstrating an attenuationartifact in the anterior region of the myocardium. No ischemia or infarct/scar is seen in the remaining myocardium. compared to the previous study, there is no significant change. No significant ischemia demonstrated. this is a low risk study.  . Aortic stenosis   . H/O aortic valve  replacement     Past Surgical History  Procedure Laterality Date  . Breast surgery  12/16/2003  . Aortic valve replacement  08/01/10    By Dr Arvid Right, St Jude AVR with 21 mm St Jude regent mechanical valve  . Colonoscopy  03/04/2003    Dr. Gala Romney- internal hemorrhoids o/w normal rectum, scattered L sided diveritula  . Colonoscopy  04/12/2009    Dr. Imogene Burn polyp, L dided diverticula, tubular adenoma on bx  . Colonoscopy N/A 01/06/2013    RMR; multiple colonic/rectal polyps s/p polypectomy. Fragments of tubular adenomas, needs surveillance Oct 2014  . Colonoscopy N/A 01/18/2013    MWU:[id number] polypectomy site with bleeding, s/p bleeding control therapy  . Cardiac catheterization  05/2010    Normal coronary arteries angiographically, normal left ventricular functionultrasonographically and severe aortic stenosis by right cath. Optimal therapy would be elective aortic valve replacement    Family History  Problem Relation Age of Onset  . Heart failure Mother   . CAD    . Diabetes      Social History:  reports that she has never smoked. She has never used smokeless tobacco. She reports that she does not drink alcohol or use illicit drugs.  Allergies:  Allergies  Allergen Reactions  . Demerol Other (See Comments)    Blood pressure goes up    Medications: I have reviewed the patient's current medications.  Results for orders placed during the hospital encounter of 12/20/13 (from the past 48 hour(s))  PROTIME-INR     Status: Abnormal   Collection Time    12/20/13  8:34 PM      Result Value Ref Range   Prothrombin Time 34.7 (*) 11.6 - 15.2 seconds   INR 3.62 (*) 0.00 - 8.67  BASIC METABOLIC PANEL     Status: Abnormal   Collection Time    12/20/13  8:34 PM      Result Value Ref Range   Sodium 139  137 - 147 mEq/L   Potassium 4.3  3.7 - 5.3 mEq/L   Chloride 102  96 - 112 mEq/L   CO2 27  19 - 32 mEq/L   Glucose, Bld 206 (*) 70 - 99 mg/dL   BUN 29 (*) 6 - 23 mg/dL    Creatinine, Ser 1.26 (*) 0.50 - 1.10 mg/dL   Calcium 9.1  8.4 - 10.5 mg/dL   GFR calc non Af Amer 42 (*) >90 mL/min   GFR calc Af Amer 49 (*) >90 mL/min   Comment: (NOTE)     The eGFR has been calculated using the CKD EPI equation.     This calculation has not been validated in all clinical situations.     eGFR's persistently <90 mL/min signify possible Chronic Kidney     Disease.  CBC WITH DIFFERENTIAL     Status: Abnormal   Collection Time    12/20/13  8:34 PM      Result Value Ref Range   WBC 11.0 (*) 4.0 - 10.5 K/uL   RBC 4.63  3.87 - 5.11 MIL/uL   Hemoglobin 11.7 (*) 12.0 - 15.0 g/dL   HCT 36.1  36.0 - 46.0 %   MCV 78.0  78.0 - 100.0 fL   MCH 25.3 (*) 26.0 - 34.0 pg   MCHC 32.4  30.0 - 36.0 g/dL   RDW 14.0  11.5 - 15.5 %   Platelets 95 (*) 150 - 400 K/uL   Comment: RESULT REPEATED AND VERIFIED   Neutrophils Relative % 69  43 - 77 %   Lymphocytes Relative 20  12 - 46 %   Monocytes Relative 10  3 - 12 %   Eosinophils Relative 1  0 - 5 %   Basophils Relative 0  0 - 1 %   Neutro Abs 7.6  1.7 - 7.7 K/uL   Lymphs Abs 2.2  0.7 - 4.0 K/uL   Monocytes Absolute 1.1 (*) 0.1 - 1.0 K/uL   Eosinophils Absolute 0.1  0.0 - 0.7 K/uL   Basophils Absolute 0.0  0.0 - 0.1 K/uL   RBC Morphology STOMATOCYTES     WBC Morphology TOXIC GRANULATION     Smear Review PLATELET COUNT CONFIRMED BY SMEAR    CBG MONITORING, ED     Status: Abnormal   Collection Time    12/20/13  8:35 PM      Result Value Ref Range   Glucose-Capillary 152 (*) 70 - 99 mg/dL  CBG MONITORING, ED     Status: Abnormal   Collection Time    12/20/13 10:59 PM      Result Value Ref Range   Glucose-Capillary 242 (*) 70 - 99 mg/dL  CBC     Status: Abnormal   Collection Time    12/21/13  1:30 AM      Result Value Ref Range   WBC 15.6 (*) 4.0 - 10.5 K/uL   RBC 3.98  3.87 - 5.11 MIL/uL   Hemoglobin 9.8 (*) 12.0 - 15.0 g/dL   HCT 30.7 (*) 36.0 - 46.0 %   MCV 77.1 (*) 78.0 - 100.0 fL  MCH 24.6 (*) 26.0 - 34.0 pg   MCHC  31.9  30.0 - 36.0 g/dL   RDW 13.9  11.5 - 15.5 %   Platelets 103 (*) 150 - 400 K/uL   Comment: PLATELET COUNT CONFIRMED BY SMEAR     LARGE PLATELETS PRESENT  PREPARE FRESH FROZEN PLASMA     Status: None   Collection Time    12/21/13  2:10 AM      Result Value Ref Range   Unit Number [id number]     Blood Component Type THAWED PLASMA     Unit division 00     Status of Unit ISSUED,FINAL     Transfusion Status OK TO TRANSFUSE     Unit Number [id number]     Blood Component Type THAWED PLASMA     Unit division 00     Status of Unit ISSUED,FINAL     Transfusion Status OK TO TRANSFUSE    MRSA PCR SCREENING     Status: None   Collection Time    12/21/13  3:04 AM      Result Value Ref Range   MRSA by PCR NEGATIVE  NEGATIVE   Comment:            The GeneXpert MRSA Assay (FDA     approved for NASAL specimens     only), is one component of a     comprehensive MRSA colonization     surveillance program. It is not     intended to diagnose MRSA     infection nor to guide or     monitor treatment for     MRSA infections.  CBC     Status: Abnormal   Collection Time    12/21/13  4:00 AM      Result Value Ref Range   WBC 12.0 (*) 4.0 - 10.5 K/uL   RBC 3.68 (*) 3.87 - 5.11 MIL/uL   Hemoglobin 9.3 (*) 12.0 - 15.0 g/dL   HCT 28.3 (*) 36.0 - 46.0 %   MCV 76.9 (*) 78.0 - 100.0 fL   MCH 25.3 (*) 26.0 - 34.0 pg   MCHC 32.9  30.0 - 36.0 g/dL   RDW 14.2  11.5 - 15.5 %   Platelets 86 (*) 150 - 400 K/uL   Comment: CONSISTENT WITH PREVIOUS RESULT  BASIC METABOLIC PANEL     Status: Abnormal   Collection Time    12/21/13  4:00 AM      Result Value Ref Range   Sodium 142  137 - 147 mEq/L   Potassium 5.2  3.7 - 5.3 mEq/L   Chloride 107  96 - 112 mEq/L   CO2 23  19 - 32 mEq/L   Glucose, Bld 289 (*) 70 - 99 mg/dL   BUN 31 (*) 6 - 23 mg/dL   Creatinine, Ser 1.49 (*) 0.50 - 1.10 mg/dL   Calcium 8.4  8.4 - 10.5 mg/dL   GFR calc non Af Amer 35 (*) >90 mL/min   GFR calc Af Amer 40 (*) >90  mL/min   Comment: (NOTE)     The eGFR has been calculated using the CKD EPI equation.     This calculation has not been validated in all clinical situations.     eGFR's persistently <90 mL/min signify possible Chronic Kidney     Disease.  GLUCOSE, CAPILLARY     Status: Abnormal   Collection Time    12/21/13  4:07 AM      Result Value  Ref Range   Glucose-Capillary 256 (*) 70 - 99 mg/dL  PROTIME-INR     Status: Abnormal   Collection Time    12/21/13  5:06 AM      Result Value Ref Range   Prothrombin Time 18.5 (*) 11.6 - 15.2 seconds   INR 1.59 (*) 0.00 - 1.49  GLUCOSE, CAPILLARY     Status: Abnormal   Collection Time    12/21/13  7:30 AM      Result Value Ref Range   Glucose-Capillary 156 (*) 70 - 99 mg/dL  GLUCOSE, CAPILLARY     Status: Abnormal   Collection Time    12/21/13 11:57 AM      Result Value Ref Range   Glucose-Capillary 144 (*) 70 - 99 mg/dL  GLUCOSE, CAPILLARY     Status: Abnormal   Collection Time    12/21/13  5:00 PM      Result Value Ref Range   Glucose-Capillary 171 (*) 70 - 99 mg/dL  GLUCOSE, CAPILLARY     Status: Abnormal   Collection Time    12/21/13  9:46 PM      Result Value Ref Range   Glucose-Capillary 207 (*) 70 - 99 mg/dL   Comment 1 Documented in Chart     Comment 2 Notify RN    GLUCOSE, CAPILLARY     Status: Abnormal   Collection Time    12/22/13  7:23 AM      Result Value Ref Range   Glucose-Capillary 233 (*) 70 - 99 mg/dL   Comment 1 Notify RN      Dg Wrist 2 Views Right  12/21/2013   CLINICAL DATA:  Pain and swelling.  EXAM: RIGHT WRIST - 2 VIEW  COMPARISON:  None.  FINDINGS: Diffuse mild soft tissue swelling noted. No acute bony abnormality identified. Radiocarpal degenerative change present.  IMPRESSION: 1. Diffuse soft tissue swelling, no acute bony abnormality. 2. Radiocarpal degenerative change.   Electronically Signed   By: Marcello Moores  Register   On: 12/21/2013 10:31   Ct Head Without Contrast  12/21/2013   CLINICAL DATA:  Followup  traumatic brain injury.  EXAM: CT HEAD WITHOUT CONTRAST  TECHNIQUE: Contiguous axial images were obtained from the base of the skull through the vertex without contrast.  COMPARISON:  Head CT earlier in the day.  FINDINGS: Large scalp hematoma with lacerations in the right and left paramedian occipital region have been stapled closed. Considerable subcutaneous air. No underlying skull fracture.  Left frontal contusion suspected with overlying subarachnoid blood in the left frontal sulci. There appears to be slightly more subarachnoid blood and slightly increased parenchymal contusion compared with priors.  Less impressive is right parieto-occipital subarachnoid blood, stable from earlier today. Mild atrophy.  IMPRESSION: Slight worsening left frontal contusion and left frontal subarachnoid blood. Stable right parieto-occipital subarachnoid collection.  Occipital lacerations have been stapled. No underlying skull fracture.   Electronically Signed   By: Rolla Flatten M.D.   On: 12/21/2013 08:02   Ct Head Wo Contrast  12/20/2013   CLINICAL DATA:  Trauma.  EXAM: CT HEAD WITHOUT CONTRAST  CT CERVICAL SPINE WITHOUT CONTRAST  TECHNIQUE: Multidetector CT imaging of the head and cervical spine was performed following the standard protocol without intravenous contrast. Multiplanar CT image reconstructions of the cervical spine were also generated.  COMPARISON:  07/19/2011 CT head. 11/16/2007 head CT and facial CT. No comparison cervical spine CT.  FINDINGS: CT HEAD FINDINGS  Large posterior parietal -occipital subcutaneous hematoma without underlying fracture.  Scattered subarachnoid blood left frontal lobe gyri and right parietal-occipital lobe medially located gyri.  No CT evidence of large acute thrombotic infarct.  No hydrocephalus.  No intracranial mass lesion noted on this unenhanced exam.  CT CERVICAL SPINE FINDINGS  Examination is slightly limited by patient's habitus. No cervical spine fracture is detected.   Straightening of the cervical spine may be related to head position or spasm. If there is a high clinical suspicion of ligamentous injury, flexion and extension views or MR can be performed for further delineation.  Prominent calcification palatini tonsils.  IMPRESSION: CT HEAD:  Large posterior parietal -occipital subcutaneous hematoma without underlying fracture.  Scattered subarachnoid blood left frontal lobe gyri and right parietal-occipital lobe medially located gyri.  CT CERVICAL SPINE:  Examination is slightly limited by patient's habitus.  No cervical spine fracture is detected.  Straightening of the cervical spine may be related to head position or spasm. If there is a high clinical suspicion of ligamentous injury, flexion and extension views or MR can be performed for further delineation.  These results were called by telephone at the time of interpretation on 12/20/2013 at 9:06 PM to Dr. Virgel Manifold , who verbally acknowledged these results.   Electronically Signed   By: Chauncey Cruel M.D.   On: 12/20/2013 21:16   Ct Cervical Spine Wo Contrast  12/20/2013   CLINICAL DATA:  Trauma.  EXAM: CT HEAD WITHOUT CONTRAST  CT CERVICAL SPINE WITHOUT CONTRAST  TECHNIQUE: Multidetector CT imaging of the head and cervical spine was performed following the standard protocol without intravenous contrast. Multiplanar CT image reconstructions of the cervical spine were also generated.  COMPARISON:  07/19/2011 CT head. 11/16/2007 head CT and facial CT. No comparison cervical spine CT.  FINDINGS: CT HEAD FINDINGS  Large posterior parietal -occipital subcutaneous hematoma without underlying fracture.  Scattered subarachnoid blood left frontal lobe gyri and right parietal-occipital lobe medially located gyri.  No CT evidence of large acute thrombotic infarct.  No hydrocephalus.  No intracranial mass lesion noted on this unenhanced exam.  CT CERVICAL SPINE FINDINGS  Examination is slightly limited by patient's habitus. No  cervical spine fracture is detected.  Straightening of the cervical spine may be related to head position or spasm. If there is a high clinical suspicion of ligamentous injury, flexion and extension views or MR can be performed for further delineation.  Prominent calcification palatini tonsils.  IMPRESSION: CT HEAD:  Large posterior parietal -occipital subcutaneous hematoma without underlying fracture.  Scattered subarachnoid blood left frontal lobe gyri and right parietal-occipital lobe medially located gyri.  CT CERVICAL SPINE:  Examination is slightly limited by patient's habitus.  No cervical spine fracture is detected.  Straightening of the cervical spine may be related to head position or spasm. If there is a high clinical suspicion of ligamentous injury, flexion and extension views or MR can be performed for further delineation.  These results were called by telephone at the time of interpretation on 12/20/2013 at 9:06 PM to Dr. Virgel Manifold , who verbally acknowledged these results.   Electronically Signed   By: Chauncey Cruel M.D.   On: 12/20/2013 21:16   Dg Hand 2 View Right  12/21/2013   CLINICAL DATA:  Swelling and pain.  EXAM: RIGHT HAND - 2 VIEW  COMPARISON:  None.  FINDINGS: Mild diffuse soft-tissue swelling. Diffuse degenerative changes noted about the hand and wrist particularly the radiocarpal joint. No acute bony or joint abnormality. Bony mineralization is normal. No periarticular osteopenia. No  erosive arthropathy.  IMPRESSION: Degenerative changes noted diffusely about the hand and wrist, particularly at the radiocarpal joint. No acute or focal abnormality. No evidence of erosive arthropathy.   Electronically Signed   By: Marcello Moores  Register   On: 12/21/2013 10:25    Review of Systems  Eyes: Negative.   Respiratory: Negative.   Cardiovascular: Negative.   Gastrointestinal: Negative.   Genitourinary: Negative.   Musculoskeletal: Negative.   Skin: Negative.   Neurological: Positive for  headaches.  Endo/Heme/Allergies: Negative.   Psychiatric/Behavioral: Negative.    Blood pressure 134/36, pulse 79, temperature 97.8 F (36.6 C), temperature source Oral, resp. rate 20, height 5' 3.5" (1.613 m), weight 122 kg (268 lb 15.4 oz), SpO2 91.00%. Physical Exam  Constitutional: She is oriented to person, place, and time. She appears well-developed and well-nourished.  HENT:  Stellate laceration on occipital scalp.  Eyes: Conjunctivae and EOM are normal.  Neck: Normal range of motion. Neck supple.  Neurological: She is alert and oriented to person, place, and time. She has normal reflexes. No cranial nerve deficit.  Skin: Skin is warm and dry.  Psychiatric: She has a normal mood and affect. Her behavior is normal. Judgment and thought content normal.    Assessment/Plan:  stable traumatic subarachnoid hemorrhage in the sylvian fissure and right parietal occipital region.  The patient can be discharged from a clinical standpoint. She may resume her Coumadin anticoagulation and weeks' time as planned. Followup with neurosurgery can be on a when necessary basis.   Remedios Mckone J 12/22/2013, 12:18 PM

## 2013-12-22 NOTE — Progress Notes (Signed)
Discharge paper work explained and given to patient. Pt verbalizes understanding and has no questions at this time. Pt is AAO, NI, GCS 15 VS WNL. MD assessed pt, IVs removed and pt belongings sent with her. Pt transported to car with her husband. Safety maintained.

## 2013-12-24 ENCOUNTER — Telehealth (HOSPITAL_COMMUNITY): Payer: Self-pay | Admitting: Emergency Medicine

## 2013-12-24 NOTE — Telephone Encounter (Signed)
Made appt for 4/1.

## 2013-12-28 ENCOUNTER — Ambulatory Visit: Payer: Medicare Other | Admitting: Cardiology

## 2013-12-28 ENCOUNTER — Ambulatory Visit (INDEPENDENT_AMBULATORY_CARE_PROVIDER_SITE_OTHER): Payer: Medicare Other | Admitting: *Deleted

## 2013-12-28 ENCOUNTER — Encounter: Payer: Self-pay | Admitting: Cardiology

## 2013-12-28 ENCOUNTER — Ambulatory Visit (INDEPENDENT_AMBULATORY_CARE_PROVIDER_SITE_OTHER): Payer: Medicare Other | Admitting: Cardiology

## 2013-12-28 VITALS — BP 148/60 | HR 84 | Ht 63.0 in | Wt 262.0 lb

## 2013-12-28 DIAGNOSIS — S066X9A Traumatic subarachnoid hemorrhage with loss of consciousness of unspecified duration, initial encounter: Secondary | ICD-10-CM

## 2013-12-28 DIAGNOSIS — Z954 Presence of other heart-valve replacement: Secondary | ICD-10-CM

## 2013-12-28 DIAGNOSIS — Z7901 Long term (current) use of anticoagulants: Secondary | ICD-10-CM

## 2013-12-28 DIAGNOSIS — Z952 Presence of prosthetic heart valve: Secondary | ICD-10-CM

## 2013-12-28 DIAGNOSIS — S066XAA Traumatic subarachnoid hemorrhage with loss of consciousness status unknown, initial encounter: Secondary | ICD-10-CM

## 2013-12-28 MED ORDER — FUROSEMIDE 20 MG PO TABS: 20.0000 mg | ORAL_TABLET | Freq: Every morning | ORAL | Status: DC

## 2013-12-28 NOTE — Patient Instructions (Addendum)
Please INCREASE furosemide to 20mg  ONCE DAILY  Please restart taking coumadin.  Take 2 tablets (10mg ) TODAY and TOMORROW 4/1 then resume previous dose of 7.5mg  daily.  Repeat INR in [place] physician recommends that you schedule a follow-up appointment in: 4-6 weeks with Dr. Gwenlyn Found

## 2013-12-29 ENCOUNTER — Ambulatory Visit (INDEPENDENT_AMBULATORY_CARE_PROVIDER_SITE_OTHER): Payer: Medicare Other | Admitting: General Surgery

## 2013-12-29 ENCOUNTER — Encounter (INDEPENDENT_AMBULATORY_CARE_PROVIDER_SITE_OTHER): Payer: Self-pay

## 2013-12-29 VITALS — BP 138/84 | HR 81 | Temp 97.8°F | Resp 18 | Ht 63.5 in | Wt 262.0 lb

## 2013-12-29 DIAGNOSIS — S0100XA Unspecified open wound of scalp, initial encounter: Secondary | ICD-10-CM

## 2013-12-29 DIAGNOSIS — S066XAA Traumatic subarachnoid hemorrhage with loss of consciousness status unknown, initial encounter: Secondary | ICD-10-CM

## 2013-12-29 DIAGNOSIS — S066X9A Traumatic subarachnoid hemorrhage with loss of consciousness of unspecified duration, initial encounter: Secondary | ICD-10-CM

## 2013-12-29 DIAGNOSIS — S0101XA Laceration without foreign body of scalp, initial encounter: Secondary | ICD-10-CM

## 2013-12-29 DIAGNOSIS — W19XXXA Unspecified fall, initial encounter: Secondary | ICD-10-CM

## 2013-12-29 NOTE — Progress Notes (Signed)
Subjective: Hailey Curry is a 68 y.o. female who presents today for follow up after a fall.  Hailey Curry fell and hit the back of her head. It was initially reported as syncopal but was later determined to just be a mechanical fall. There was a brief loss of consciousness. She is anticoagulated on coumadin. She was taken to [facility]. A CT scan of the brain showed a traumatic brain injury. Neurosurgery was called and recommended the patient be transferred to [facility]. She had a large scalp laceration and hematoma which could not be repaired by the emergency room physician. A pressure dressing was applied and she was transferred. Her scalp laceration was closed in the ED emergently to achieve hemostasis. She was admitted by the trauma service to the ICU.   Neurosurgery recommended non-operative treatment and a repeat head CT the following day. This was stable and the patient remained neurologically normal with the exception of a mild headache for the duration of her hospitalization. She received vitamin K and fresh frozen plasma to reverse her anticoagulation. Cardiology was consulted for the possible syncope and to give guidance with respect to her anticoagulation. She was mobilized with physical therapy and did well. Neurosurgery recommended holding anticoagulation for a week. She was discharged home in good condition in the care of her husband.    She was discharged from the hospital on 12/22/13.  The patient is tolerating their diet well, starting to get back to normal activities.  Bowel function is good.  She complains of intermittent headaches, weakness with ambulation, mild SOB with exertion, and leg swelling.  She just saw her cardiologist yesterday who restarted her coumadin.  Her husband notes she has been doing very well after surgery and denies any significant neurological deficits such as changes in behavior, balance issues, memory loss.  He notes she is weak and has a hard time  getting up and down out of bed or a chair.  She has not made a f/u appt with the neurosurgeon and was told to see him on an as needed basis.   Objective: Vital signs in last 24 hours: Reviewed   PE: General: pleasant, WD/WN white female who is in NAD HEENT: head is normocephalic, large healed scalp laceration with improved scalp hematoma which is soft and non-tender.  Sclera are noninjected.  PERRL.  Ears and nose without any masses or lesions.  Mouth is pink and moist Heart: regular, rate, and rhythm.  Normal s1,s2. No obvious murmurs, gallops, or rubs noted.  Palpable radial and pedal pulses bilaterally Lungs: CTAB, no wheezes, rhonchi, or rales noted.  Respiratory effort nonlabored Abd: soft, NT/ND, +BS, no masses, hernias, or organomegaly MS: all 4 extremities are symmetrical with no cyanosis, clubbing; +2 edema b/l LE Skin: warm and dry with no masses, lesions, or rashes Neuro/Psych: A&Ox3 with an appropriate affect.  Moves all extremities well.  CN II-XII intact.   Assessment/Plan  S/P Fall, SDH, scalp laceration, scalp hematoma:  1.  Doing well, may start to resume normal activity/exercise as tolerated.  She can follow up with [place] PRN and knows to call with questions or concerns.   2.  Staples removed today.  Okay to shower and clean with soap and water.  Scab will start to fall off.  Can apply mederma or vitamin E ointment to help with scarring.  The scalp hematoma will resolve over time.  If she develops worsening headaches, dizziness, N/V, changes in behavior, loss of balance, memory loss  call the Neurosurgeon for an ASAP follow up.       Coralie Keens, PA-C 12/29/2013

## 2013-12-29 NOTE — Patient Instructions (Signed)
Staples removed today.  Okay to shower and clean with soap and water.  Scab will start to fall off.  Can apply mederma or vitamin E ointment to help with scarring.  The bruise under the skin will resolve over time.  If you develop worsening headaches, dizziness, N/V, changes in behavior, loss of balance, memory loss call the Neurosurgeon for an ASAP follow up.

## 2013-12-31 ENCOUNTER — Emergency Department (HOSPITAL_COMMUNITY)
Admission: EM | Admit: 2013-12-31 | Discharge: 2013-12-31 | Disposition: A | Discharge status: Home / Self Care | Payer: Medicare Other | Attending: Emergency Medicine | Admitting: Emergency Medicine

## 2013-12-31 ENCOUNTER — Encounter (HOSPITAL_COMMUNITY): Payer: Self-pay | Admitting: Emergency Medicine

## 2013-12-31 ENCOUNTER — Emergency Department (HOSPITAL_COMMUNITY): Payer: Medicare Other

## 2013-12-31 ENCOUNTER — Telehealth (INDEPENDENT_AMBULATORY_CARE_PROVIDER_SITE_OTHER): Payer: Self-pay | Admitting: General Surgery

## 2013-12-31 DIAGNOSIS — R51 Headache: Secondary | ICD-10-CM | POA: Insufficient documentation

## 2013-12-31 DIAGNOSIS — Z954 Presence of other heart-valve replacement: Secondary | ICD-10-CM | POA: Insufficient documentation

## 2013-12-31 DIAGNOSIS — R519 Headache, unspecified: Secondary | ICD-10-CM

## 2013-12-31 DIAGNOSIS — G8929 Other chronic pain: Secondary | ICD-10-CM | POA: Insufficient documentation

## 2013-12-31 DIAGNOSIS — Z860100 Personal history of colon polyps, unspecified: Secondary | ICD-10-CM | POA: Insufficient documentation

## 2013-12-31 DIAGNOSIS — I10 Essential (primary) hypertension: Secondary | ICD-10-CM | POA: Insufficient documentation

## 2013-12-31 DIAGNOSIS — R5381 Other malaise: Secondary | ICD-10-CM | POA: Insufficient documentation

## 2013-12-31 DIAGNOSIS — Z794 Long term (current) use of insulin: Secondary | ICD-10-CM | POA: Insufficient documentation

## 2013-12-31 DIAGNOSIS — D649 Anemia, unspecified: Secondary | ICD-10-CM | POA: Insufficient documentation

## 2013-12-31 DIAGNOSIS — IMO0002 Reserved for concepts with insufficient information to code with codable children: Secondary | ICD-10-CM | POA: Insufficient documentation

## 2013-12-31 DIAGNOSIS — R5383 Other fatigue: Secondary | ICD-10-CM

## 2013-12-31 DIAGNOSIS — Z79899 Other long term (current) drug therapy: Secondary | ICD-10-CM | POA: Insufficient documentation

## 2013-12-31 DIAGNOSIS — Z853 Personal history of malignant neoplasm of breast: Secondary | ICD-10-CM | POA: Insufficient documentation

## 2013-12-31 DIAGNOSIS — Z9889 Other specified postprocedural states: Secondary | ICD-10-CM | POA: Insufficient documentation

## 2013-12-31 DIAGNOSIS — G8911 Acute pain due to trauma: Secondary | ICD-10-CM | POA: Insufficient documentation

## 2013-12-31 DIAGNOSIS — S0003XA Contusion of scalp, initial encounter: Secondary | ICD-10-CM

## 2013-12-31 DIAGNOSIS — E119 Type 2 diabetes mellitus without complications: Secondary | ICD-10-CM | POA: Insufficient documentation

## 2013-12-31 DIAGNOSIS — G4733 Obstructive sleep apnea (adult) (pediatric): Secondary | ICD-10-CM | POA: Insufficient documentation

## 2013-12-31 DIAGNOSIS — Z7901 Long term (current) use of anticoagulants: Secondary | ICD-10-CM | POA: Insufficient documentation

## 2013-12-31 HISTORY — DX: Anemia, unspecified: D64.9

## 2013-12-31 HISTORY — DX: Unspecified abdominal pain: R10.9

## 2013-12-31 HISTORY — DX: Laceration without foreign body of scalp, initial encounter: S01.01XA

## 2013-12-31 HISTORY — DX: Traumatic subarachnoid hemorrhage with loss of consciousness of unspecified duration, initial encounter: S06.6X9A

## 2013-12-31 HISTORY — DX: Other chronic pain: G89.29

## 2013-12-31 LAB — PROTIME-INR
INR: 1.43 (ref 0.00–1.49)
Prothrombin Time: 17.1 seconds — ABNORMAL HIGH (ref 11.6–15.2)

## 2013-12-31 MED ORDER — ACETAMINOPHEN 325 MG PO TABS
650.0000 mg | ORAL_TABLET | Freq: Once | ORAL | Status: AC
  Administered 2013-12-31: 650 mg via ORAL
  Filled 2013-12-31: qty 2

## 2013-12-31 MED ORDER — HYDROCODONE-ACETAMINOPHEN 5-325 MG PO TABS: 1.0000 | ORAL_TABLET | Freq: Once | ORAL | Status: DC

## 2013-12-31 NOTE — ED Notes (Signed)
Pt also co severe weakness and nausea along with headache.

## 2013-12-31 NOTE — ED Provider Notes (Signed)
CSN: [id number]     Arrival date & time 12/31/13  1211 History   First MD Initiated Contact with Patient 12/31/13 1314     Chief Complaint  Patient presents with  . Headache      HPI Pt was seen at 1325. Per pt, c/o gradual onset and persistence of intermittent headache for the past 1 to 2 weeks. States the pain began after she fell last week, hitting her posterior scalp, and sustaining a scalp laceration as well as traumatic SAH. Pt was admitted to the hospital for same and discharged last week. Pt had the staples removed from her scalp laceration yesterday. States she continues to have a "dull" headache, as well as fleeting episodes of "shooting pain," lasting 1 to 2 seconds, since being discharged from the hospital. Her generalized weakness/fatigue also continues since being discharged from the hospital.  Pt states her Cards MD restarted her coumadin 3 days ago. Pt was also started on zithromax for a presumed "sinus infection." States she has taken only one OTC tylenol 325mg  tablet several days ago for her discomfort. Pt states she came to the ED today "because I'm scared."  Denies headache was sudden or maximal in onset or at any time.  Denies visual changes, no focal motor weakness, no tingling/numbness in extremities, no fevers, no neck pain, no rash, no new fall/injury.      Past Medical History  Diagnosis Date  . Diabetes mellitus   . Hypertension   . High cholesterol   . Breast cancer 11/2003  . Colonic adenoma   . OSA (obstructive sleep apnea)     AHI 45.0/hr, REM sleep was 231 mins.sleep was 77% and 84%, RDI was 21.5/hr total sleep was 2h 58 mins  . Hx of echocardiogram 04/28/2012    EF>55%, Moderate to severe mitral annular calcification, mild mitral regurgitation, mild mitral stenosis, mild tricuspid regurgitation, the prosthetic aortic valve is not well visualized, 21 mm St Jude Regent michanical valve there is aortic root sclerosis/calcification. 38mmhg midcavity gradient.  .  History of stress test 04/2008    Normal myocardial perfusion scan demonstrating an attenuationartifact in the anterior region of the myocardium. No ischemia or infarct/scar is seen in the remaining myocardium. compared to the previous study, there is no significant change. No significant ischemia demonstrated. this is a low risk study.  . Aortic stenosis   . H/O aortic valve replacement   . Scalp laceration 12/21/2013  . Traumatic subarachnoid hemorrhage 12/21/2013  . Chronic anemia   . Chronic abdominal pain    Past Surgical History  Procedure Laterality Date  . Breast surgery  12/16/2003  . Aortic valve replacement  08/01/10    By Dr Arvid Right, St Jude AVR with 21 mm St Jude regent mechanical valve  . Colonoscopy  03/04/2003    Dr. Gala Romney- internal hemorrhoids o/w normal rectum, scattered L sided diveritula  . Colonoscopy  04/12/2009    Dr. Imogene Burn polyp, L dided diverticula, tubular adenoma on bx  . Colonoscopy N/A 01/06/2013    RMR; multiple colonic/rectal polyps s/p polypectomy. Fragments of tubular adenomas, needs surveillance Oct 2014  . Colonoscopy N/A 01/18/2013    KWI:OXBDZH polypectomy site with bleeding, s/p bleeding control therapy  . Cardiac catheterization  05/2010    Normal coronary arteries angiographically, normal left ventricular functionultrasonographically and severe aortic stenosis by right cath. Optimal therapy would be elective aortic valve replacement   Family History  Problem Relation Age of Onset  . Heart failure Mother   .  CAD    . Diabetes     History  Substance Use Topics  . Smoking status: Never Smoker   . Smokeless tobacco: Never Used  . Alcohol Use: No    Review of Systems ROS: Statement: All systems negative except as marked or noted in the HPI; Constitutional: Negative for fever and chills. ; ; Eyes: Negative for eye pain, redness and discharge. ; ; ENMT: Negative for ear pain, hoarseness, nasal congestion, sinus pressure and sore throat. ; ;  Cardiovascular: Negative for chest pain, palpitations, diaphoresis, dyspnea and peripheral edema. ; ; Respiratory: Negative for cough, wheezing and stridor. ; ; Gastrointestinal: Negative for nausea, vomiting, diarrhea, abdominal pain, blood in stool, hematemesis, jaundice and rectal bleeding. . ; ; Genitourinary: Negative for dysuria, flank pain and hematuria. ; ; Musculoskeletal: Negative for back pain and neck pain. Negative for swelling and trauma.; ; Skin: Negative for pruritus, rash, abrasions, blisters, bruising and skin lesion.; ; Neuro: +headache. Negative for lightheadedness and neck stiffness. Negative for weakness, altered level of consciousness , altered mental status, extremity weakness, paresthesias, involuntary movement, seizure and syncope.      Allergies  Demerol  Home Medications   Current Outpatient Rx  Name  Route  Sig  Dispense  Refill  . acetaminophen (TYLENOL) 325 MG tablet   Oral   Take 2 tablets (650 mg total) by mouth every 4 (four) hours as needed for headache.         . citalopram (CELEXA) 40 MG tablet   Oral   Take 1 tablet by mouth every evening.         . cyanocobalamin (,VITAMIN B-12,) 1000 MCG/ML injection   Intramuscular   Inject 1,000 mcg into the muscle every 30 (thirty) days.         Marland Kitchen FIBER SELECT GUMMIES CHEW   Oral   Chew 1 tablet by mouth 2 (two) times daily.         . fluticasone (FLONASE) 50 MCG/ACT nasal spray   Nasal   Place 2 sprays into the nose daily as needed for allergies or rhinitis.          . furosemide (LASIX) 20 MG tablet   Oral   Take 1 tablet (20 mg total) by mouth every morning.   30 tablet   6   . insulin aspart protamine- aspart (NOVOLOG MIX 70/30) (70-30) 100 UNIT/ML injection   Subcutaneous   Inject 10-35 Units into the skin 2 (two) times daily with a meal. Takes 10 units in the evening and 35 units in the morning         . loratadine (CLARITIN) 10 MG tablet   Oral   Take 10 mg by mouth every  morning.         . metFORMIN (GLUCOPHAGE) 500 MG tablet   Oral   Take 500 mg by mouth 2 (two) times daily with a meal.         . mupirocin ointment (BACTROBAN) 2 %   Topical   Apply 1 application topically daily as needed.          Marland Kitchen omeprazole (PRILOSEC) 20 MG capsule   Oral   Take 20 mg by mouth every morning.         . warfarin (COUMADIN) 5 MG tablet   Oral   Take 5 mg by mouth as directed.          BP 137/37  Pulse 90  Temp(Src) 97.6 F (36.4 C) (Oral)  Resp 18  Ht 5\' 3"  (1.6 m)  Wt 260 lb (117.935 kg)  BMI 46.07 kg/m2  SpO2 95% Physical Exam 1330: Physical examination:  Nursing notes reviewed; Vital signs and O2 SAT reviewed;  Constitutional: Well developed, Well nourished, Well hydrated, In no acute distress; Head:  Normocephalic, atraumatic. +healing wound posterior scalp. No open wounds, no active bleeding. +TTP right occipital area without overlying erythema, edema, open wounds.; Eyes: EOMI, PERRL, No scleral icterus; ENMT: Mouth and pharynx normal, Mucous membranes moist; Neck: Supple, Full range of motion, No lymphadenopathy; Cardiovascular: Regular rate and rhythm, No gallop; Respiratory: Breath sounds clear & equal bilaterally, No rales, rhonchi, wheezes.  Speaking full sentences with ease, Normal respiratory effort/excursion; Chest: Nontender, Movement normal; Abdomen: Soft, Nontender, Nondistended, Normal bowel sounds; Genitourinary: No CVA tenderness; Spine:  No midline CS, TS, LS tenderness.;; Extremities: Pulses normal, No tenderness, No edema, No calf edema or asymmetry.; Neuro: AA&Ox3, Major CN grossly intact. No facial droop. Speech clear. No gross focal motor or sensory deficits in extremities.; Skin: Color normal, Warm, Dry.; Psych:  Anxious.    ED Course  Procedures     EKG Interpretation None      MDM  MDM Reviewed: previous chart, nursing note and vitals Reviewed previous: labs and CT scan Interpretation: labs and CT scan   Results  for orders placed during the hospital encounter of 12/31/13  PROTIME-INR      Result Value Ref Range   Prothrombin Time 17.1 (*) 11.6 - 15.2 seconds   INR 1.43  0.00 - 1.49   Ct Head Wo Contrast 12/31/2013   CLINICAL DATA:  [place] last week hitting back of the head. Patient complaining of abnormal headache today.  EXAM: CT HEAD WITHOUT CONTRAST  TECHNIQUE: Contiguous axial images were obtained from the base of the skull through the vertex without intravenous contrast.  COMPARISON:  12/21/2013  FINDINGS: Posterior scalp hematoma with associated air has increased from the prior study. It now measures 19 mm in thickness. There is no radiopaque foreign body. There is no underlying skull fracture.  Anterior left frontal lobe contusion and subarachnoid hemorrhage along the left frontal lobe anteriorly and posteriorly has evolved, less conspicuous than on the prior study.  No evidence of new intracranial hemorrhage.  Ventricles are normal in size and configuration. There are no parenchymal masses or mass effect. Minor periventricular white matter hypoattenuation is stable consistent with chronic microvascular ischemic change.  There is no evidence of a cortical infarct.  No extra-axial masses or abnormal fluid collections.  Visualized sinuses and mastoid air cells are clear.  IMPRESSION: 1. Posterior scalp hematoma, which is associated with air, is larger that was on the prior exam. No radiopaque foreign body or underlying skull fracture. 2. No new intracranial hemorrhage. Previously described areas of left anterior frontal lobe contusion and left anterior and posterior frontal regions subarachnoid hemorrhage have evolved and are less conspicuous.   Electronically Signed   By: Lajean Manes M.D.   On: 12/31/2013 14:53    1525:  Pt states she feels better after tylenol and wants to go home now.  CT scan today with continued scalp hematoma, no open wounds visualized, as well as resolving intracranial hemorrhage. T/C to  [facility] Tra[facility] Dr. Grandville Silos, case discussed, including:  HPI, pertinent PM/SHx, VS/PE, dx testing, ED course and treatment:  Agreeable with ED evaluation, remind pt she will likely have headaches for the next 2 to 4 weeks, tx headache symptomatically, have pt f/u next week for  INR check as previously scheduled. Dx and testing d/w pt and family.  Questions answered.  Verb understanding, agreeable to d/c home with outpt f/u.    Alfonzo Feller, DO 01/03/14 1329

## 2013-12-31 NOTE — Telephone Encounter (Signed)
Pt is s/p fall with SAH and scalp laceration repaired in the ED.  Pt on Coumadin.  Staples removed on Wed.  Pt c/o pain around ear and difficulty turning her neck without pain.  Pt is taking a Z pack for a presumed sinus infection.  I advised that she have a physician evaluate her wound and her pain today.

## 2013-12-31 NOTE — Discharge Instructions (Signed)
°Emergency Department Resource Guide °1) Find a Doctor and Pay Out of Pocket °Although you won't have to find out who is covered by your insurance plan, it is a good idea to ask around and get recommendations. You will then need to call the office and see if the doctor you have chosen will accept you as a new patient and what types of options they offer for patients who are self-pay. Some doctors offer discounts or will set up payment plans for their patients who do not have insurance, but you will need to ask so you aren't surprised when you get to your appointment. ° °2) Contact Your Local Health Department °Not all health departments have doctors that can see patients for sick visits, but many do, so it is worth a call to see if yours does. If you don't know where your local health department is, you can check in your phone book. The CDC also has a tool to help you locate your state's health department, and many state websites also have listings of all of their local health departments. ° °3) Find a Walk-in Clinic °If your illness is not likely to be very severe or complicated, you may want to try a walk in clinic. These are popping up all over the country in pharmacies, drugstores, and shopping centers. They're usually staffed by nurse practitioners or physician assistants that have been trained to treat common illnesses and complaints. They're usually fairly quick and inexpensive. However, if you have serious medical issues or chronic medical problems, these are probably not your best option. ° °No Primary Care Doctor: °- Call Health Connect at  832-8000 - they can help you locate a primary care doctor that  accepts your insurance, provides certain services, etc. °- Physician Referral Service- 1-800-533-3463 ° °Chronic Pain Problems: °Organization         Address  Phone   Notes  °[facility]  (336) [phone] Patients need to be referred by their primary care doctor.  ° °Medication  Assistance: °Organization         Address  Phone   Notes  °[place] [facility] [address] °[place], [place] [address] (336) 641-8030 --Must be a resident of [place] °-- Must have NO insurance coverage whatsoever (no Medicaid/ Medicare, etc.) °-- The pt. MUST have a primary care doctor that directs their care regularly and follows them in the community °  °[facility]  (866) 331-1348   °[employer]  (888) 892-1162   ° °Agencies that provide inexpensive medical care: °Organization         Address  Phone   Notes  °[facility]  (336) 832-8035   °[facility]    (336) 832-7272   °[facility] [address] °[place], [place] [address] (336) 832-4777   °[facility] [address], °[place] (336) 271-4999   °[facility]    (336) 373-0678   °[facility]    (336) 272-1050   °[facility] ° [address], [place] Phone:  (336) 832-4444, Fax:  (336) 832-4440 Hours of Operation:  9 am - 6 pm, M-F.  Also accepts Medicaid/Medicare and self-pay.  ° [facility] ° [address], [place] Phone: (336) 832-3150, Fax: (336) [phone]. Hours of Operation:  8:30 am - 5:30 pm, M-F.  Also accepts Medicaid and self-pay.  °[facility] [place] [address], [place] Phone: (336) 878-6027   °[facility] [address], [place], [place] (336)723-1848, Ext. 123 Mondays & Thursdays: 7-9 AM.  First 15 patients are seen on a first come, first serve basis. °  ° °Medicaid-accepting [place] Providers: ° °Organization         Address  Phone   Notes  °[facility] [address], [place] (336) 641-2100 Also accepts self-pay patients.  °[facility] [address], [place] ° (336) 856-9996   °[facility] [address], [place]  (336) 288-8857   °[facility] [address] [place] [address], [place] (336) 299-7000   °Veita Bland [address], [place]  ° (336) 373-1557 Only accepts [place] Access Medicaid patients after they have their name applied to their card.  ° °Self-Pay (no insurance) in [place]: ° °Organization         Address  Phone   Notes  °Sickle Cell Patients, [facility] [address], [place] (336) 832-1970   °[facility] [address], [place] (336) 832-4400   °[facility] ° [address], [place] (336) 992-4800   °[facility]/Dr. Osei-Bonsu ° [address] [place] [address], [place] or [address], [place] (336) [phone] Phone number for both [place] and [place] locations is the same.  °[facility] [address], [place] (336) 299-0000   °[facility] [address] [place] [address], [place] or [address] (336) 852-7530 °(336) 878-2260   °[facility] [address], [place] (336) 350-1642, phone; (336) 294-5005, fax Sees patients 1st and 3rd Saturday of every month.  Must not qualify for public or private insurance (i.e. Medicaid, Medicare, [place] Health Choice, Veterans' Benefits) • Household income should be no more than 200% of the poverty level •The clinic cannot treat you if you are pregnant or think you are pregnant • Sexually transmitted diseases are not treated at the clinic.  ° ° °Dental Care: °Organization         Address  Phone  Notes  °[place] [facility] [address], [place] (336) 641-6152 Accepts children up to age 21 who are enrolled in Medicaid or [place] Health Choice; pregnant women with a Medicaid card; and children who have applied for Medicaid or [place] Health Choice, but were declined, whose parents can pay a reduced fee at time of service.  °[place]  [facility] [place]  [address], [place] (336) 641-7733 Accepts children up to age 21 who are enrolled in Medicaid or [place] Health Choice; pregnant women with a Medicaid card; and children who have applied for Medicaid or [place] Health Choice, but were declined, whose parents can pay a reduced fee at time of service.  °[facility] ° [address],  (336) 641-4533 Patients are seen by appointment only. Walk-ins are not accepted. [facility] will see patients 18 years of age and older. °Monday - Tuesday (8am-5pm) °Most Wednesdays (8:30-5pm) °$30 per visit, cash only  °[facility] ° [address], [place] (336) 641-4533 Patients are seen by appointment only. Walk-ins are not accepted. [facility] will see patients 18 years of age and older. °One   Wednesday Evening (Monthly: Volunteer Based).  $30 per visit, cash only  °[facility]  (919) 537-3737 for adults; Children under age 4, call Graduate Pediatric Dentistry at ([phone]) [phone]. Children aged 4-14, please call ([phone]) [phone] to request a pediatric application. ° Dental services are provided in all areas of dental care including fillings, crowns and bridges, complete and partial dentures, implants, gum treatment, root canals, and extractions. Preventive care is also provided. Treatment is provided to both adults and children. °Patients are selected via a lottery and there is often a waiting list. °  °[facility] [address], °[place] ° (336) 763-8833 [email or web address] °  °[facility] [address], [place], [place] (336)723-1848, Ext. 123 Second and Fourth Thursday of each month, opens at 6:30 AM; Clinic ends at 9 AM.  Patients are seen on a first-come first-served basis, and a limited number are seen during each clinic.  ° °[facility] ° [address], [place], [place] (336) 723-7904    Eligibility Requirements °You must have lived in [place], [place], or [place] counties for at least the last three months. °  You cannot be eligible for state or federal sponsored healthcare insurance, including Veterans Administration, Medicaid, or Medicare. °  You generally cannot be eligible for healthcare insurance through your employer.  °  How to apply: °Eligibility screenings are held every Tuesday and Wednesday afternoon from 1:00 pm until 4:00 pm. You do not need an appointment for the interview!  °[facility] [address], [place], [place] 336-631-2330   °[facility]  336-342-8273   °[facility]  336-703-3100   °[facility]  336-570-6415   ° °Behavioral Health Resources in the Community: °Intensive Outpatient Programs °Organization         Address  Phone  Notes  °[facility] [address], [place], [place] 336-878-6098   °[facility] [address], [place], [place] 336-832-9800   °[facility] [address], [place], [place] ° 336-882-2125   °[facility] [address],  °[place], [place] 1-800-853-5163 or 336-641-4981   °Substance Abuse Resources °Organization         Address  Phone  Notes  °[facility]  336-882-2125   °[facility]  336-784-9470   °[facility]  336-285-9073   °[facility]  336-845-3988   °Residential & Outpatient Substance Abuse Program  1-800-659-3381   °Psychological Services °Organization         Address  Phone  Notes  °[facility]  336- 832-9600   °[facility]  336- 378-7881   °[facility] [address], [place] 1-800-853-5163 or 336-641-4981   ° °Mobile Crisis Teams °Organization         Address  Phone  Notes  °[facility], Mobile Crisis Care Unit  1-877-626-1772   °Assertive °[facility] ° [address]  [place], [place] 336-834-9664   °Sharon DeEsch [address] °[place] 336-554-5454   ° °Self-Help/Support Groups °Organization         Address  Phone             Notes  °[facility] [place] - variety of support groups  336- 373-1402 Call for more information  °Narcotics Anonymous (NA), [facility] [address], °[place]  2 meetings at this location  ° °  Residential Treatment Programs Organization         Address  Phone  Notes  [facility] [address],    [place]  1-424-633-3154   [facility]  [address] [place] [address], [place], [place]   [place], [place] [phone] Admissions: 8am-3pm M-F  [facility] [place] [address],    [place], [place] 342-876-8115   [facility] [address] [place], [place], [place]   [facility] [address],  [place], [place]   [facility] Intensive Outpatient [address], [place], [place]   [facility] ([place] [address]  [place], [place] 1-469-071-4461 or 440-359-6294   [facility] [address] [place], [place] Accepts Medicaid  [facility] [place] [address],  [place] 1-573-862-7181 Substance Abuse/Addiction Treatment   [facility]         Address  Phone  Notes  [facility]  [phone]   Domenic Schwab, PhD [address] [place], [place]   (509) 858-2413 or (226)804-0423   [facility] [place], [place] (240) 266-2939   [facility] [address], [place], [place] (779)484-1683 Insurance/Medicaid/sponsorship through [facility] [address] [place]                                    [place], [place] (431) [phone] [place] [address], [place] [phone]    Dr. Adele Schilder  ([phone]) [phone]   [facility] Dept. 1) [address] [place] 2) [place] 3)  [address], [place] (515)[phone] ([phone]  662-514-0797   [place] 830-373-3498 or 3102263697 (After Hours)       Take your usual prescriptions as previously directed.  Take over the counter extra strength tylenol, as directed on packaging, as needed for discomfort. Apply moist heat or ice to the area(s) of discomfort, for 15 minutes at a time, several times per day for the next few days.  Do not fall asleep on a heating or ice pack.  Call your regular medical doctor on Monday to schedule a follow up appointment in the next 3 days.  Return to the Emergency Department immediately if worsening.

## 2013-12-31 NOTE — ED Notes (Signed)
Pt seen in ER for head injury on 3/23, pt has since had staples removed, pt states she is having sharp shooting pains through head and cannot lay on rt side. Pain behind rt ear.

## 2014-01-01 ENCOUNTER — Encounter: Payer: Self-pay | Admitting: Cardiology

## 2014-01-01 NOTE — Progress Notes (Signed)
Patient ID: Hailey Curry, female   DOB: 1945-11-18, 68 y.o.   MRN: [id number]    01/01/2014 Hailey Curry   1946-03-10  [id number]  Primary Physicia Purvis Kilts, MD Primary Cardiologist: Dr. Gwenlyn Found  HPI:  Hailey Curry is a 68 year-old female, followed by Dr. Gwenlyn Found, with a mechanical aortic valve on chronic Coumadin therapy, prior bypass surgery/CAD, hypertension, diabetes who was admitted on 12/20/13 for a fall to the back of the head. It was initially reported as syncopal but was later determined to just be a mechanical fall. There was a brief loss of consciousness. She was taken to [facility]. A CT scan of the brain showed a traumatic brain injury. Neurosurgery was called and recommended the patient be transferred to [facility]. She had a large scalp laceration and hematoma which could not be repaired by the emergency room physician. A pressure dressing was applied and she was transferred. Her scalp laceration was closed in the [facility] ED emergently to achieve hemostasis. She was admitted by the trauma service to the ICU.    Neurosurgery recommended non-operative treatment and a repeat head CT the following day. This was stable and the patient remained neurologically normal with the exception of a mild headache for the duration of her hospitalization. She received vitamin K and fresh frozen plasma to reverse her anticoagulation. Cardiology was consulted for possible syncope and to give guidance with respect to her anticoagulation. She was mobilized with physical therapy and did well. Neurosurgery recommended holding anticoagulation for a week. She was discharged home in good condition in the care of her husband.   She returns to clinic today for post hospital follow-up. She is accompanied by her husband. She has been off of Coumadin since her fall. Her HA has resolved. She denies any further falls and head trauma. No pain and no changes in vision. No dizziness, syncope/near  syncope. No chest pain and no SOB.   Current Outpatient Prescriptions  Medication Sig Dispense Refill  . acetaminophen (TYLENOL) 325 MG tablet Take 2 tablets (650 mg total) by mouth every 4 (four) hours as needed for headache.      . citalopram (CELEXA) 40 MG tablet Take 1 tablet by mouth every evening.      . cyanocobalamin (,VITAMIN B-12,) 1000 MCG/ML injection Inject 1,000 mcg into the muscle every 30 (thirty) days.      Marland Kitchen FIBER SELECT GUMMIES CHEW Chew 1 tablet by mouth 2 (two) times daily.      . fluticasone (FLONASE) 50 MCG/ACT nasal spray Place 2 sprays into the nose daily as needed for allergies or rhinitis.       . furosemide (LASIX) 20 MG tablet Take 1 tablet (20 mg total) by mouth every morning.  30 tablet  6  . insulin aspart protamine- aspart (NOVOLOG MIX 70/30) (70-30) 100 UNIT/ML injection Inject 10-35 Units into the skin 2 (two) times daily with a meal. Takes 10 units in the evening and 35 units in the morning      . loratadine (CLARITIN) 10 MG tablet Take 10 mg by mouth every morning.      . metFORMIN (GLUCOPHAGE) 500 MG tablet Take 500 mg by mouth 2 (two) times daily with a meal.      . omeprazole (PRILOSEC) 20 MG capsule Take 20 mg by mouth every morning.      . warfarin (COUMADIN) 5 MG tablet Take 7.5 mg by mouth daily at 6 PM.       .  azithromycin (ZITHROMAX) 250 MG tablet Take 250 mg by mouth daily.       No current facility-administered medications for this visit.    Allergies  Allergen Reactions  . Demerol Other (See Comments)    Blood pressure goes up  . Naprosyn [Naproxen]     Legs swelling    History   Social History  . Marital Status: Married    Spouse Name: N/A    Number of Children: N/A  . Years of Education: N/A   Occupational History  . Not on file.   Social History Main Topics  . Smoking status: Never Smoker   . Smokeless tobacco: Never Used  . Alcohol Use: No  . Drug Use: No  . Sexual Activity: Not on file   Other Topics Concern  . Not  on file   Social History Narrative  . No narrative on file     Review of Systems: General: negative for chills, fever, night sweats or weight changes.  Cardiovascular: negative for chest pain, dyspnea on exertion, edema, orthopnea, palpitations, paroxysmal nocturnal dyspnea or shortness of breath Dermatological: negative for rash Respiratory: negative for cough or wheezing Urologic: negative for hematuria Abdominal: negative for nausea, vomiting, diarrhea, bright red blood per rectum, melena, or hematemesis Neurologic: negative for visual changes, syncope, or dizziness All other systems reviewed and are otherwise negative except as noted above.    Blood pressure 148/60, pulse 84, height 5\' 3"  (1.6 m), weight 262 lb (118.842 kg).  General appearance: alert, cooperative, no distress and moderately obese Neck: no carotid bruit and no JVD Lungs: clear to auscultation bilaterally Heart: regular rate and rhythm, S1, S2 normal, crisps mechanical valve sounds Extremities: no LEE Pulses: 2+ and symmetric Skin: warm and dry Neurologic: Grossly normal    ASSESSMENT AND PLAN:   Traumatic subarachnoid hemorrhage Pt appears neurologically stable. Per Neurosurgery's recommendations, she can restart Coumadin, as it has been > 1 weeks since repeat CT revealed stabilization of her subarachnoid hemorrhage.  Long term (current) use of anticoagulants Restart Coumadin for mechanical Aortic Valve. INR Goal is 2.5-3.5. INR is subtherapeutic today at 1.1. Result was reported to our office pharmacist who recommended the patient take 10 mg x 2 days followed by 7.5 mg daily w/ repeat INR check at our [place] office in 1 week.   S/P AVR (aortic valve replacement) Stable. No chest pain, SOB or syncope. Restart Coumadin today for anticoagulation.     PLAN  Restart coumadin today. Pt was given dosing instructions. Will repeat INR check in 1 week. Pt was instructed to seek urgent medical evaluation if  she notices any neurological changes. She voiced understanding. She was instructed to follow-up with Dr. Gwenlyn Found in 4-6 weeks.   SIMMONS, BRITTAINYPA-C 01/01/2014 11:37 AM

## 2014-01-01 NOTE — Assessment & Plan Note (Signed)
Restart Coumadin for mechanical Aortic Valve. INR Goal of 2.5-3.5. INR is sub therapeutic today at 1.1. Result was reported to our office pharmacist who recommended the patient take 10 mg x 2 days followed by 7.5 mg daily w/ repeat INR check at our [place] office in 1 week.

## 2014-01-01 NOTE — Assessment & Plan Note (Signed)
Stable. No chest pain, SOB or syncope. Restart Coumadin today for anticoagulation.

## 2014-01-01 NOTE — Assessment & Plan Note (Signed)
Pt appears neurologically stable. Per Neurosurgery's recommendations, she can restart Coumadin, as it has been > 1 weeks since repeat CT revealed stabilization of her subarachnoid hemorrhage.

## 2014-01-03 ENCOUNTER — Telehealth: Payer: Self-pay | Admitting: Pharmacist Clinician (PhC)/ Clinical Pharmacy Specialist

## 2014-01-03 NOTE — Telephone Encounter (Signed)
Spouse called, wanted to know if she should keep Wed appt, had INR at ER on 4.3.15  Spoke with husband, pt INR low at 1.4, however had been d/c from hospital on 3/26 after fall - subarachnoid hemorrhage.  Was told on d/c to hold warfarin x 1 week before restarting.  Was at ER on 4.3 and INR only up to 1.4.  Advised husband that because of brain injury will let INR rise without adding doses, and pt needs to keep appt for Wednesday.  He voiced understanding.

## 2014-01-05 ENCOUNTER — Ambulatory Visit: Payer: Medicare Other | Admitting: Pharmacist Clinician (PhC)/ Clinical Pharmacy Specialist

## 2014-01-07 ENCOUNTER — Ambulatory Visit (INDEPENDENT_AMBULATORY_CARE_PROVIDER_SITE_OTHER): Payer: Medicare Other | Admitting: Pharmacist Clinician (PhC)/ Clinical Pharmacy Specialist

## 2014-01-07 DIAGNOSIS — Z7901 Long term (current) use of anticoagulants: Secondary | ICD-10-CM

## 2014-01-07 DIAGNOSIS — Z952 Presence of prosthetic heart valve: Secondary | ICD-10-CM

## 2014-01-07 DIAGNOSIS — Z954 Presence of other heart-valve replacement: Secondary | ICD-10-CM

## 2014-01-07 LAB — POCT INR: INR: 3.5

## 2014-01-12 ENCOUNTER — Telehealth (INDEPENDENT_AMBULATORY_CARE_PROVIDER_SITE_OTHER): Payer: Self-pay | Admitting: General Surgery

## 2014-01-12 NOTE — Telephone Encounter (Signed)
Pt called back with more questions, so referred to [facility] for further help.

## 2014-01-12 NOTE — Telephone Encounter (Signed)
Pt's husband called to ask about wound care; she had her staples removed and it is oozing serosanguinous fluid.  He is further concerned because she is diabetic.  Reassured husband that this is normal drainage.  Explained how to cleanse the area with antibacterial soap, rinse thoroughly and pat dry.  Do not use antibacterial ointment, and try to leave open to air AMAP.  If they prefer, they can do this BID.  They will call back if symptoms of infection develop.  He understands all and will comply.

## 2014-01-13 ENCOUNTER — Telehealth (HOSPITAL_COMMUNITY): Payer: Self-pay

## 2014-01-13 NOTE — Telephone Encounter (Signed)
Scheduled appt for 4/22 for suture removal.

## 2014-01-19 ENCOUNTER — Encounter (INDEPENDENT_AMBULATORY_CARE_PROVIDER_SITE_OTHER): Payer: Self-pay

## 2014-01-19 ENCOUNTER — Ambulatory Visit (INDEPENDENT_AMBULATORY_CARE_PROVIDER_SITE_OTHER): Payer: Medicare Other | Admitting: Orthopedic Surgery

## 2014-01-19 ENCOUNTER — Ambulatory Visit (INDEPENDENT_AMBULATORY_CARE_PROVIDER_SITE_OTHER): Payer: Medicare Other | Admitting: Pharmacist Clinician (PhC)/ Clinical Pharmacy Specialist

## 2014-01-19 VITALS — BP 138/70 | HR 80 | Temp 97.6°F | Resp 16 | Ht 63.5 in | Wt 263.8 lb

## 2014-01-19 DIAGNOSIS — S0100XA Unspecified open wound of scalp, initial encounter: Secondary | ICD-10-CM

## 2014-01-19 DIAGNOSIS — S0101XA Laceration without foreign body of scalp, initial encounter: Secondary | ICD-10-CM

## 2014-01-19 DIAGNOSIS — Z954 Presence of other heart-valve replacement: Secondary | ICD-10-CM

## 2014-01-19 DIAGNOSIS — Z952 Presence of prosthetic heart valve: Secondary | ICD-10-CM

## 2014-01-19 DIAGNOSIS — Z7901 Long term (current) use of anticoagulants: Secondary | ICD-10-CM

## 2014-01-19 LAB — POCT INR: INR: 4.8

## 2014-01-19 MED ORDER — MUPIROCIN 2 % EX OINT: 1.0000 "application " | TOPICAL_OINTMENT | Freq: Two times a day (BID) | CUTANEOUS | Status: DC

## 2014-01-19 NOTE — Patient Instructions (Signed)
Please apply antibiotic ointment in the wound twice a day.

## 2014-01-19 NOTE — Progress Notes (Signed)
Subjective Hailey Curry comes in s/p complex scalp wound with repair. As the wound has healed it has become evident that there were a couple of sutures that we missed removing.    Objective Scalp: Wound with eschar over bottom 75%. This was removed easily along with 2 nylon sutures. Wound bed with mix of granulation.    Assessment & Plan Scalp wound -- Treat wound with abx ointment twice daily. F/u in two weeks.    Lisette Abu, PA-C Pager: [phone] General Trauma PA Pager: [phone]

## 2014-02-02 ENCOUNTER — Encounter: Payer: Self-pay | Admitting: Cardiovascular Disease

## 2014-02-02 ENCOUNTER — Ambulatory Visit (INDEPENDENT_AMBULATORY_CARE_PROVIDER_SITE_OTHER): Payer: Medicare Other | Admitting: Pharmacist Clinician (PhC)/ Clinical Pharmacy Specialist

## 2014-02-02 ENCOUNTER — Encounter (INDEPENDENT_AMBULATORY_CARE_PROVIDER_SITE_OTHER): Payer: Medicare Other

## 2014-02-02 ENCOUNTER — Ambulatory Visit (INDEPENDENT_AMBULATORY_CARE_PROVIDER_SITE_OTHER): Payer: Medicare Other | Admitting: Cardiovascular Disease

## 2014-02-02 VITALS — BP 141/63 | HR 89 | Ht 63.5 in | Wt 275.0 lb

## 2014-02-02 DIAGNOSIS — Z952 Presence of prosthetic heart valve: Secondary | ICD-10-CM

## 2014-02-02 DIAGNOSIS — Z7901 Long term (current) use of anticoagulants: Secondary | ICD-10-CM

## 2014-02-02 DIAGNOSIS — Z954 Presence of other heart-valve replacement: Secondary | ICD-10-CM

## 2014-02-02 LAB — POCT INR: INR: 4.6

## 2014-02-02 NOTE — Assessment & Plan Note (Signed)
A: pt with worsening DOE and peripheral edema, but no signs of pulmonary edema of exam and likely that this is multifactorial from hematologic, renal and possibly endocrine issues P:  - pt to have 2D echo and f/u in 3 mos if normal - pt will follow up with renal, endocrine, hematology, and PCP

## 2014-02-02 NOTE — Patient Instructions (Signed)
  We will see you back in follow up in 3 months with Dr Gwenlyn Found  Dr Gwenlyn Found has ordered an echocardiogram. Echocardiography is a painless test that uses sound waves to create images of your heart. It provides your doctor with information about the size and shape of your heart and how well your heart's chambers and valves are working. This procedure takes approximately one hour. There are no restrictions for this procedure.

## 2014-02-02 NOTE — Progress Notes (Signed)
Patient ID: Hailey Curry, female   DOB: [birth year], 68 y.o.   MRN: [id number]   02/02/2014 Hailey Curry   1946-01-29  [id number]  Primary Physicia Hailey Kilts, MD Primary Cardiologist: Dr. Gwenlyn Curry  HPI:  Hailey Curry is a 68 year-old female, followed by Dr. Gwenlyn Curry, with a mechanical aortic valve on chronic Coumadin therapy, prior bypass surgery/CAD, hypertension, diabetes who was admitted on 12/20/13 to 12/22/13 for a scalp laceration and hematoma after a fall. The wound was closed and patient was given vitamin K and FFP to reverse anticoagulation. She was discharged with the recommendation to remain off of coumadin for 1 week. She then followed with Hailey Jester, PA on 12/28/13 and coumadin was restarted. Since that time, the patient has had two INR checks which has been 3.5 on 01/07/14 and 4.8 on 01/19/14. Additionally, the patient is having worsening lower extremity edema and anemia. Per notes from her oncologist, Hailey All, MD at [facility] in [place], her hemoglobin was 8.1 on 01/24/14. Dr. Tressie Curry saw the patient yesterday for this issue is concerned that this anemia and edema if multifactorial, including iron deficiency, hemolysis from the St. Jude Aortic Valve, and possible malignancy. The patient has a history of breast cancer s/p lumpectomy and radiation in 2005. The oncologist ordered numerous labs including CBC, LDH, haptoglobin, fecal occult cards, fe panel, combs test, Ca 27-29, pathology smear, CMP. She has also been referred to nephrology, who has an appointment with Dr. Jimmy Curry next week. She is also being followed by Dr. Chalmers Curry for endocrinology issues.   Today, she notes that her dyspnea on exertion has been worsening over the last year. Additionally, her peripheral edema was worsening from January until March 2014. Her PCP, Dr. Hilma Curry, placed her on Lasix 60 mg po daily x 3 weeks, which helped with the swelling. She is now taking 10 mg PO daily at her  discretion. She denies chest pain.   Current Outpatient Prescriptions  Medication Sig Dispense Refill  . acetaminophen (TYLENOL) 325 MG tablet Take 2 tablets (650 mg total) by mouth every 4 (four) hours as needed for headache.      . citalopram (CELEXA) 40 MG tablet Take 1 tablet by mouth every evening.      . cyanocobalamin (,VITAMIN B-12,) 1000 MCG/ML injection Inject 1,000 mcg into the muscle every 30 (thirty) days.      Marland Kitchen FIBER SELECT GUMMIES CHEW Chew 1 tablet by mouth 2 (two) times daily.      . fluticasone (FLONASE) 50 MCG/ACT nasal spray Place 2 sprays into the nose daily as needed for allergies or rhinitis.       . furosemide (LASIX) 20 MG tablet Take 1 tablet (20 mg total) by mouth every morning.  30 tablet  6  . insulin aspart protamine- aspart (NOVOLOG MIX 70/30) (70-30) 100 UNIT/ML injection Inject 10-35 Units into the skin 2 (two) times daily with a meal. Takes 10 units in the evening and 35 units in the morning      . loratadine (CLARITIN) 10 MG tablet Take 10 mg by mouth every morning.      . mupirocin ointment (BACTROBAN) 2 % Place 1 application into the nose 2 (two) times daily.  22 g  0  . omeprazole (PRILOSEC) 20 MG capsule Take 20 mg by mouth every morning.      . warfarin (COUMADIN) 5 MG tablet Take 7.5 mg by mouth daily at 6 PM.        No  current facility-administered medications for this visit.    Allergies  Allergen Reactions  . Demerol Other (See Comments)    Blood pressure goes up  . Naprosyn [Naproxen]     Legs swelling    History   Social History  . Marital Status: Married    Spouse Name: N/A    Number of Children: N/A  . Years of Education: N/A   Occupational History  . Not on file.   Social History Main Topics  . Smoking status: Never Smoker   . Smokeless tobacco: Never Used  . Alcohol Use: No  . Drug Use: No  . Sexual Activity: Not on file   Other Topics Concern  . Not on file   Social History Narrative  . No narrative on file      Review of Systems: General: negative for chills, fever, night sweats or weight changes.  Cardiovascular: negative for chest pain, dyspnea on exertion, edema, orthopnea, palpitations, paroxysmal nocturnal dyspnea or shortness of breath Dermatological: negative for rash Respiratory: negative for cough or wheezing Urologic: negative for hematuria Abdominal: negative for nausea, vomiting, diarrhea, bright red blood per rectum, melena, or hematemesis Neurologic: negative for visual changes, syncope, or dizziness Curry other systems reviewed and are otherwise negative except as noted above.    Blood pressure 141/63, pulse 89, height 5' 3.5" (1.613 m), weight 275 lb (124.739 kg).  General appearance: chronically ill appearing, elderly WF, morbidly obese alert, cooperative Neck: no carotid bruit and no JVD Lungs: clear to auscultation bilaterally Heart: regular rate and rhythm, S1, S2 normal, crisps mechanical valve sounds Extremities: 2+ peripheral edema to mid-calf Pulses: 2+ and symmetric Skin: warm and dry Neurologic: Grossly normal    ASSESSMENT AND PLAN:     S/P AVR (aortic valve replacement) A: pt with worsening DOE and peripheral edema, but no signs of pulmonary edema of exam and likely that this is multifactorial from hematologic, renal and possibly endocrine issues P:  - pt to have 2D echo and f/u in 3 mos if normal - pt will follow up with renal, endocrine, hematology, and PCP  Long term (current) use of anticoagulants A: INR supratherapeutic at 4.6 today P: re-dose per pharmacy (see anti-coag visit for specifics)     Hailey Ran, MD 02/02/2014 3:47 PM  I have seen and examined Hailey Curry and agree with the assessment and plan Dr. Maricela Curry. Her INRs have  Consistently been supratherapeutic recently despite being aggressively titrated by her pharmacist. She status post St. Jude aortic valve replacement back in 2011 with normal coronaries and normal LV  function. Her last 2-D echo was in August of last year which was normal. Her major complaints or lower extremity edema which is currently being worked up as been treated with oral diuretic. She is having multiple subspecialists including a nephrologist, endocrinologist and hematologist oncologist. I'm going to repeat a 2-D echocardiogram for LV function and Doppler function and we'll see her back in 3 months for followup   Lorretta Harp, M.D., [place], [facility], [place] [place] [address] [place]. [place], [place]  [id number]  [phone] 02/02/2014 5:13 PM

## 2014-02-02 NOTE — Assessment & Plan Note (Signed)
A: INR supratherapeutic at 4.6 today P: re-dose per pharmacy (see anti-coag visit for specifics)

## 2014-02-08 ENCOUNTER — Other Ambulatory Visit: Payer: Self-pay | Admitting: Pharmacist Clinician (PhC)/ Clinical Pharmacy Specialist

## 2014-02-09 ENCOUNTER — Encounter (INDEPENDENT_AMBULATORY_CARE_PROVIDER_SITE_OTHER): Payer: Medicare Other

## 2014-02-10 ENCOUNTER — Ambulatory Visit (HOSPITAL_COMMUNITY): Payer: Medicare Other

## 2014-02-16 ENCOUNTER — Ambulatory Visit (HOSPITAL_COMMUNITY)
Admission: RE | Admit: 2014-02-16 | Discharge: 2014-02-16 | Disposition: A | Discharge status: Home / Self Care | Payer: Medicare Other | Source: Ambulatory Visit | Attending: Cardiology | Admitting: Cardiology

## 2014-02-16 ENCOUNTER — Ambulatory Visit (INDEPENDENT_AMBULATORY_CARE_PROVIDER_SITE_OTHER): Payer: Medicare Other | Admitting: Pharmacist Clinician (PhC)/ Clinical Pharmacy Specialist

## 2014-02-16 DIAGNOSIS — Z952 Presence of prosthetic heart valve: Secondary | ICD-10-CM

## 2014-02-16 DIAGNOSIS — Z7901 Long term (current) use of anticoagulants: Secondary | ICD-10-CM

## 2014-02-16 DIAGNOSIS — Z954 Presence of other heart-valve replacement: Secondary | ICD-10-CM | POA: Insufficient documentation

## 2014-02-16 DIAGNOSIS — I059 Rheumatic mitral valve disease, unspecified: Secondary | ICD-10-CM

## 2014-02-16 LAB — POCT INR: INR: 2.3

## 2014-02-16 NOTE — Progress Notes (Signed)
2D Echocardiogram Complete.  02/16/2014   Evvie Behrmann, RDCS 

## 2014-02-22 ENCOUNTER — Telehealth: Payer: Self-pay | Admitting: *Deleted

## 2014-02-22 DIAGNOSIS — Z952 Presence of prosthetic heart valve: Secondary | ICD-10-CM

## 2014-02-22 NOTE — Telephone Encounter (Signed)
Message copied by Chauncy Lean on Tue Feb 22, 2014  1:13 PM ------      Message from: Lorretta Harp      Created: Thu Feb 17, 2014  6:01 AM       Nl LV fxn and well functioning Mech AoV. There is new mild to mod MS. Repeat in 12 months ------

## 2014-02-22 NOTE — Telephone Encounter (Signed)
Order placed for repeat echo in year

## 2014-03-16 ENCOUNTER — Ambulatory Visit: Payer: Medicare Other | Admitting: Pharmacist Clinician (PhC)/ Clinical Pharmacy Specialist

## 2014-03-17 ENCOUNTER — Ambulatory Visit (INDEPENDENT_AMBULATORY_CARE_PROVIDER_SITE_OTHER): Payer: Medicare Other | Admitting: *Deleted

## 2014-03-17 DIAGNOSIS — Z954 Presence of other heart-valve replacement: Secondary | ICD-10-CM

## 2014-03-17 DIAGNOSIS — Z952 Presence of prosthetic heart valve: Secondary | ICD-10-CM

## 2014-03-17 DIAGNOSIS — Z7901 Long term (current) use of anticoagulants: Secondary | ICD-10-CM

## 2014-03-17 LAB — POCT INR: INR: 2.2

## 2014-04-11 ENCOUNTER — Ambulatory Visit (INDEPENDENT_AMBULATORY_CARE_PROVIDER_SITE_OTHER): Payer: Medicare Other | Admitting: *Deleted

## 2014-04-11 DIAGNOSIS — Z952 Presence of prosthetic heart valve: Secondary | ICD-10-CM

## 2014-04-11 DIAGNOSIS — Z954 Presence of other heart-valve replacement: Secondary | ICD-10-CM

## 2014-04-11 DIAGNOSIS — Z7901 Long term (current) use of anticoagulants: Secondary | ICD-10-CM

## 2014-04-11 LAB — POCT INR: INR: 3.7

## 2014-04-25 ENCOUNTER — Emergency Department (HOSPITAL_COMMUNITY)
Admission: EM | Admit: 2014-04-25 | Discharge: 2014-04-25 | Disposition: A | Discharge status: Home / Self Care | Payer: Medicare Other | Attending: Emergency Medicine | Admitting: Emergency Medicine

## 2014-04-25 ENCOUNTER — Encounter (HOSPITAL_COMMUNITY): Payer: Self-pay | Admitting: Emergency Medicine

## 2014-04-25 ENCOUNTER — Emergency Department (HOSPITAL_COMMUNITY): Payer: Medicare Other

## 2014-04-25 DIAGNOSIS — Z862 Personal history of diseases of the blood and blood-forming organs and certain disorders involving the immune mechanism: Secondary | ICD-10-CM | POA: Diagnosis not present

## 2014-04-25 DIAGNOSIS — Y9389 Activity, other specified: Secondary | ICD-10-CM | POA: Insufficient documentation

## 2014-04-25 DIAGNOSIS — Z860100 Personal history of colon polyps, unspecified: Secondary | ICD-10-CM | POA: Insufficient documentation

## 2014-04-25 DIAGNOSIS — S0990XA Unspecified injury of head, initial encounter: Secondary | ICD-10-CM | POA: Diagnosis not present

## 2014-04-25 DIAGNOSIS — Z79899 Other long term (current) drug therapy: Secondary | ICD-10-CM | POA: Diagnosis not present

## 2014-04-25 DIAGNOSIS — Z7901 Long term (current) use of anticoagulants: Secondary | ICD-10-CM | POA: Diagnosis not present

## 2014-04-25 DIAGNOSIS — Z794 Long term (current) use of insulin: Secondary | ICD-10-CM | POA: Diagnosis not present

## 2014-04-25 DIAGNOSIS — Z9889 Other specified postprocedural states: Secondary | ICD-10-CM | POA: Insufficient documentation

## 2014-04-25 DIAGNOSIS — E119 Type 2 diabetes mellitus without complications: Secondary | ICD-10-CM | POA: Insufficient documentation

## 2014-04-25 DIAGNOSIS — N183 Chronic kidney disease, stage 3 unspecified: Secondary | ICD-10-CM | POA: Diagnosis not present

## 2014-04-25 DIAGNOSIS — Y929 Unspecified place or not applicable: Secondary | ICD-10-CM | POA: Diagnosis not present

## 2014-04-25 DIAGNOSIS — Z87828 Personal history of other (healed) physical injury and trauma: Secondary | ICD-10-CM | POA: Diagnosis not present

## 2014-04-25 DIAGNOSIS — Z853 Personal history of malignant neoplasm of breast: Secondary | ICD-10-CM | POA: Insufficient documentation

## 2014-04-25 DIAGNOSIS — G8929 Other chronic pain: Secondary | ICD-10-CM | POA: Insufficient documentation

## 2014-04-25 DIAGNOSIS — R296 Repeated falls: Secondary | ICD-10-CM | POA: Diagnosis not present

## 2014-04-25 DIAGNOSIS — Z8669 Personal history of other diseases of the nervous system and sense organs: Secondary | ICD-10-CM | POA: Diagnosis not present

## 2014-04-25 DIAGNOSIS — Z792 Long term (current) use of antibiotics: Secondary | ICD-10-CM | POA: Diagnosis not present

## 2014-04-25 DIAGNOSIS — IMO0002 Reserved for concepts with insufficient information to code with codable children: Secondary | ICD-10-CM | POA: Diagnosis not present

## 2014-04-25 DIAGNOSIS — I129 Hypertensive chronic kidney disease with stage 1 through stage 4 chronic kidney disease, or unspecified chronic kidney disease: Secondary | ICD-10-CM | POA: Diagnosis not present

## 2014-04-25 MED ORDER — ACETAMINOPHEN 325 MG PO TABS
325.0000 mg | ORAL_TABLET | Freq: Once | ORAL | Status: AC
  Administered 2014-04-25: 325 mg via ORAL
  Filled 2014-04-25: qty 1

## 2014-04-25 MED ORDER — OXYCODONE-ACETAMINOPHEN 5-325 MG PO TABS: 1.0000 | ORAL_TABLET | ORAL | Status: DC | PRN

## 2014-04-25 NOTE — Discharge Instructions (Signed)

## 2014-04-25 NOTE — ED Notes (Signed)
Pt states she got up too fast from chair, became dizzy, lost her balance and fell. Bruising beside of left eye and knot to left forehead. Pt states she is on coumadin. Denies LOC.

## 2014-04-28 ENCOUNTER — Other Ambulatory Visit (HOSPITAL_COMMUNITY): Payer: Self-pay | Admitting: Family Medicine

## 2014-04-28 ENCOUNTER — Ambulatory Visit (HOSPITAL_COMMUNITY)
Admission: RE | Admit: 2014-04-28 | Discharge: 2014-04-28 | Disposition: A | Discharge status: Home / Self Care | Payer: Medicare Other | Source: Ambulatory Visit | Attending: Family Medicine | Admitting: Family Medicine

## 2014-04-28 DIAGNOSIS — M545 Low back pain, unspecified: Secondary | ICD-10-CM | POA: Insufficient documentation

## 2014-04-28 DIAGNOSIS — M533 Sacrococcygeal disorders, not elsewhere classified: Secondary | ICD-10-CM

## 2014-04-29 NOTE — ED Provider Notes (Signed)
CSN: [id number]     Arrival date & time 04/25/14  1841 History   First MD Initiated Contact with Patient 04/25/14 1855     Chief Complaint  Patient presents with  . Fall     (Consider location/radiation/quality/duration/timing/severity/associated sxs/prior Treatment) HPI  30yF presenting after fall. Happened shortly before arrival. Known to me from previous ED visit and had fall with resultant small head bleed. Has been doing well since then up until today. Lost balance. Did strike head/face. No LOC. On coumadin. Initially declining pain meds. NO acute numbness, tingling or loss of strength. No mental status change per husband. No acute visual complaints. No n/v.   Past Medical History  Diagnosis Date  . Diabetes mellitus   . Hypertension   . High cholesterol   . Breast cancer 11/2003  . Colonic adenoma   . OSA (obstructive sleep apnea)     AHI 45.0/hr, REM sleep was 231 mins.sleep was 77% and 84%, RDI was 21.5/hr total sleep was 2h 58 mins  . Hx of echocardiogram 04/28/2012    EF>55%, Moderate to severe mitral annular calcification, mild mitral regurgitation, mild mitral stenosis, mild tricuspid regurgitation, the prosthetic aortic valve is not well visualized, 21 mm St Jude Regent michanical valve there is aortic root sclerosis/calcification. 43mmhg midcavity gradient.  . History of stress test 04/2008    Normal myocardial perfusion scan demonstrating an attenuationartifact in the anterior region of the myocardium. No ischemia or infarct/scar is seen in the remaining myocardium. compared to the previous study, there is no significant change. No significant ischemia demonstrated. this is a low risk study.  . Aortic stenosis   . H/O aortic valve replacement   . Scalp laceration 12/21/2013  . Traumatic subarachnoid hemorrhage 12/21/2013  . Chronic anemia   . Chronic abdominal pain   . Renal disorder     Stage 3 kidney disease.   Past Surgical History  Procedure Laterality Date  .  Breast surgery  12/16/2003  . Aortic valve replacement  08/01/10    By Dr Arvid Right, St Jude AVR with 21 mm St Jude regent mechanical valve  . Colonoscopy  03/04/2003    Dr. Gala Romney- internal hemorrhoids o/w normal rectum, scattered L sided diveritula  . Colonoscopy  04/12/2009    Dr. Imogene Burn polyp, L dided diverticula, tubular adenoma on bx  . Colonoscopy N/A 01/06/2013    RMR; multiple colonic/rectal polyps s/p polypectomy. Fragments of tubular adenomas, needs surveillance Oct 2014  . Colonoscopy N/A 01/18/2013    DPO:EUMPNT polypectomy site with bleeding, s/p bleeding control therapy  . Cardiac catheterization  05/2010    Normal coronary arteries angiographically, normal left ventricular functionultrasonographically and severe aortic stenosis by right cath. Optimal therapy would be elective aortic valve replacement   Family History  Problem Relation Age of Onset  . Heart failure Mother   . CAD    . Diabetes     History  Substance Use Topics  . Smoking status: Never Smoker   . Smokeless tobacco: Never Used  . Alcohol Use: No   OB History   Grav Para Term Preterm Abortions TAB SAB Ect Mult Living                 Review of Systems  All systems reviewed and negative, other than as noted in HPI.   Allergies  Demerol and Naprosyn  Home Medications   Prior to Admission medications   Medication Sig Start Date End Date Taking? Authorizing Provider  citalopram (CELEXA) 40  MG tablet Take 1 tablet by mouth every evening. 04/05/13  Yes Historical Provider, MD  cyanocobalamin (,VITAMIN B-12,) 1000 MCG/ML injection Inject 1,000 mcg into the muscle every 30 (thirty) days.   Yes Historical Provider, MD  FIBER SELECT GUMMIES CHEW Chew 1 tablet by mouth 2 (two) times daily.   Yes Historical Provider, MD  fluticasone (FLONASE) 50 MCG/ACT nasal spray Place 2 sprays into the nose daily as needed for allergies or rhinitis.    Yes Historical Provider, MD  furosemide (LASIX) 20 MG tablet Take  20 mg by mouth 3 (three) times daily.  12/28/13  Yes Brittainy Simmons, PA-C  insulin aspart protamine- aspart (NOVOLOG MIX 70/30) (70-30) 100 UNIT/ML injection Inject 30-45 Units into the skin 2 (two) times daily with a meal. Takes 45 units in the evening and 30 units in the morning   Yes Historical Provider, MD  loratadine (CLARITIN) 10 MG tablet Take 10 mg by mouth every morning.   Yes Historical Provider, MD  mupirocin ointment (BACTROBAN) 2 % Apply 1 application topically 2 (two) times daily. To hand 01/19/14  Yes Lisette Abu, PA-C  ranitidine (ZANTAC) 75 MG tablet Take 75 mg by mouth daily.   Yes Historical Provider, MD  warfarin (COUMADIN) 5 MG tablet Take 5-7.5 mg by mouth See admin instructions. 7.5mg  taken on Mondays,Wednesdasy, and Fridays. Takes 5mg  on all other days   Yes Historical Provider, MD  acetaminophen (TYLENOL) 325 MG tablet Take 2 tablets (650 mg total) by mouth every 4 (four) hours as needed for headache. 12/22/13   Lisette Abu, PA-C  oxyCODONE-acetaminophen (PERCOCET/ROXICET) 5-325 MG per tablet Take 1 tablet by mouth every 4 (four) hours as needed for severe pain. 04/25/14   Virgel Manifold, MD   BP 127/48  Pulse 72  Temp(Src) 99.4 F (37.4 C) (Oral)  Resp 20  Ht 5' 3.5" (1.613 m)  Wt 252 lb (114.306 kg)  BMI 43.93 kg/m2  SpO2 98% Physical Exam  Nursing note and vitals reviewed. Constitutional: She is oriented to person, place, and time. She appears well-developed and well-nourished. No distress.  HENT:  Head: Normocephalic.  Mild swelling/erythema to L forehead/periorbit  Eyes: Conjunctivae are normal. Right eye exhibits no discharge. Left eye exhibits no discharge.  Neck: Neck supple.  Cardiovascular: Normal rate, regular rhythm and normal heart sounds.  Exam reveals no gallop and no friction rub.   No murmur heard. Pulmonary/Chest: Effort normal and breath sounds normal. No respiratory distress.  Abdominal: Soft. She exhibits no distension. There is  no tenderness.  Musculoskeletal: She exhibits no edema and no tenderness.  No midline spinal tenderness  Neurological: She is alert and oriented to person, place, and time. No cranial nerve deficit. She exhibits normal muscle tone. Coordination normal.  Skin: Skin is warm and dry.  Psychiatric: She has a normal mood and affect. Her behavior is normal. Thought content normal.    ED Course  Procedures (including critical care time) Labs Review Labs Reviewed - No data to display  Imaging Review Dg Sacrum/coccyx  04/28/2014   CLINICAL DATA:  History of trauma from a fall now complaining of severe low back pain.  EXAM: SACRUM AND COCCYX - 2+ VIEW  COMPARISON:  No priors.  FINDINGS: There is no evidence of fracture or other focal bone lesions  IMPRESSION: Negative.   Electronically Signed   By: Vinnie Langton M.D.   On: 04/28/2014 08:52     EKG Interpretation None      MDM  Final diagnoses:  Closed head injury, initial encounter  Anticoagulated on Coumadin    68yF presenting after fall. Nonfocal neuro exam. No concerning findings on CT. No new complaints prior to DC.     Virgel Manifold, MD 04/29/14 2236

## 2014-05-09 ENCOUNTER — Ambulatory Visit (INDEPENDENT_AMBULATORY_CARE_PROVIDER_SITE_OTHER): Payer: Medicare Other | Admitting: *Deleted

## 2014-05-09 DIAGNOSIS — Z952 Presence of prosthetic heart valve: Secondary | ICD-10-CM

## 2014-05-09 DIAGNOSIS — Z954 Presence of other heart-valve replacement: Secondary | ICD-10-CM

## 2014-05-09 DIAGNOSIS — Z7901 Long term (current) use of anticoagulants: Secondary | ICD-10-CM

## 2014-05-09 LAB — POCT INR: INR: 3.1

## 2014-05-11 ENCOUNTER — Encounter: Payer: Self-pay | Admitting: Dietician

## 2014-05-11 ENCOUNTER — Encounter: Payer: Medicare Other | Attending: Nephrology | Admitting: Dietician

## 2014-05-11 VITALS — Ht 63.5 in | Wt 265.9 lb

## 2014-05-11 DIAGNOSIS — N183 Chronic kidney disease, stage 3 unspecified: Secondary | ICD-10-CM | POA: Diagnosis present

## 2014-05-11 DIAGNOSIS — Z7901 Long term (current) use of anticoagulants: Secondary | ICD-10-CM | POA: Insufficient documentation

## 2014-05-11 DIAGNOSIS — E119 Type 2 diabetes mellitus without complications: Secondary | ICD-10-CM | POA: Insufficient documentation

## 2014-05-11 DIAGNOSIS — Z794 Long term (current) use of insulin: Secondary | ICD-10-CM | POA: Insufficient documentation

## 2014-05-11 DIAGNOSIS — I129 Hypertensive chronic kidney disease with stage 1 through stage 4 chronic kidney disease, or unspecified chronic kidney disease: Secondary | ICD-10-CM | POA: Insufficient documentation

## 2014-05-11 DIAGNOSIS — Z713 Dietary counseling and surveillance: Secondary | ICD-10-CM | POA: Diagnosis present

## 2014-05-11 NOTE — Progress Notes (Signed)
Medical Nutrition Therapy:  Appt start time: 1430 end time:  4010.   Assessment:  Primary concerns today: Hailey Curry is here today since she was diagnosed with CKD stage 3 in May. Has had diabetes for 8-10 years. States that her last Hgb A1c was 5.6% about in May/June Testing blood sugar most every day and averaging between 130-140 mg/dl fasting. Will be having another Hgb A1c test in September.  When diagnsosed with CKD in May was told to avoid most soda, watch cheese intake, dairy products, Mexican/Chinese food, keep sodium at 2-3 g per day, and watching phosphorus levels. Doctor is encouraging her to lose weight. Feeling frustrated since she thinks she is gaining weight because of it.   Had been trying to lose weight and lost about 20 lbs of fluid when started Lasix in May. Started back on insulin in March and and had gained weight after that. Usual body weight for past 20 years is 252 lbs and weight that about a year ago. Had gone off insulin off insulin in the winter since she was gaining weight. Went back on since sugar went.   Lives with her husband and states that they eat out at least 5 x week. Usually shares the meal preparation at home. Had 2 fall recently with injuries. Still stiff and recovered from injury 2 weeks ago.  Preferred Learning Style:   No preference indicated   Learning Readiness:   Contemplating  Ready  MEDICATIONS: see list   DIETARY INTAKE:   Avoided foods include: kale, spinach, brussels sprouts, liver (on coumadin so limits Vitamin K)  24-hr recall:  B ( AM): 2 boiled eggs and English Muffin or egg white [employer] with coffee and crystal light Snk ( AM): tries to have peanut butter crackers or fruit with cheese stick  L ( PM): sandwich with deli [place] and ham (low sodium) with white bread  Snk ( PM): tries to have peanut butter crackers or fruit with cheese stick  D ( PM):seafood at restaurant or grilled chicken sandwich or Whopper Snk ( PM):  yogurt or jello or fruit with cheese or graham crackers and peanut butter Beverages: water or crystal light  Usual physical activity: none, still stiff from fall  Estimated energy needs: 1600 calories 180 g carbohydrates 120 g protein 44 g fat  Progress Towards Goal(s):  In progress.   Nutritional Diagnosis:  [place]-2.2 Altered nutrition-related laboratory As related to CKD Stage 3.  As evidenced by excess consumption of sodium and phosphorus per patient recall.    Intervention:  Nutrition counseling provided. Plan: When looking at the food label, aim to keep sodium 300-400 mg/dl per meal.  Start looking at the labels in your kitchen. Try using olive oil and balsamic vinegar. (Try olive oil store at [facility]). Aim to eat out out no more than 2-3 x week. If you used canned foods, rinse them first. Or try to have frozen plain vegetables (no seasoning) instead. Try almond or soy milk to help avoid phosphorus (unsweetened is best choice). Limit meat to size of deck of cards (3 oz).  Consider having cut up vegetables cut up and ready to eat.  For dairy, have no more than 1 cup of yogurt or glass of milk or 1 oz of cheese per day.  Teaching Method Utilized:  Visual Auditory Hands on  Handouts given during visit include:   Phosphorus Foods List  Sodium content of foods  15 g CHO Snacks  Barriers to learning/adherence to  lifestyle change: confused/overwhelmed with diet restrictions, goes out to eat a lot  Demonstrated degree of understanding via:  Teach Back   Monitoring/Evaluation:  Dietary intake, exercise, and body weight when able to schedule appointment per pt schedule.

## 2014-05-11 NOTE — Patient Instructions (Signed)
When looking at the food label, aim to keep sodium 300-400 mg/dl per meal.  Start looking at the labels in your kitchen. Try using olive oil and balsamic vinegar. (Try olive oil store at [facility]). Aim to eat out out no more than 2-3 x week. If you used canned foods, rinse them first. Or try to have frozen plain vegetables (no seasoning) instead. Try almond or soy milk to help avoid phosphorus (unsweetened is best choice). Limit meat to size of deck of cards (3 oz).  Consider having cut up vegetables cut up and ready to eat.  For dairy, have no more than 1 cup of yogurt or glass of milk or 1 oz of cheese per day.

## 2014-05-16 ENCOUNTER — Telehealth: Payer: Self-pay | Admitting: Dietician

## 2014-05-16 ENCOUNTER — Emergency Department (HOSPITAL_COMMUNITY): Payer: Medicare Other

## 2014-05-16 ENCOUNTER — Encounter (HOSPITAL_COMMUNITY): Payer: Self-pay | Admitting: Emergency Medicine

## 2014-05-16 ENCOUNTER — Inpatient Hospital Stay (HOSPITAL_COMMUNITY)
Admission: EM | Admit: 2014-05-16 | Discharge: 2014-05-18 | DRG: 291 | Disposition: A | Discharge status: Home Health | Payer: Medicare Other | Attending: Internal Medicine | Admitting: Internal Medicine

## 2014-05-16 DIAGNOSIS — J9600 Acute respiratory failure, unspecified whether with hypoxia or hypercapnia: Secondary | ICD-10-CM | POA: Diagnosis present

## 2014-05-16 DIAGNOSIS — R109 Unspecified abdominal pain: Secondary | ICD-10-CM

## 2014-05-16 DIAGNOSIS — E662 Morbid (severe) obesity with alveolar hypoventilation: Secondary | ICD-10-CM | POA: Diagnosis present

## 2014-05-16 DIAGNOSIS — I129 Hypertensive chronic kidney disease with stage 1 through stage 4 chronic kidney disease, or unspecified chronic kidney disease: Secondary | ICD-10-CM | POA: Diagnosis present

## 2014-05-16 DIAGNOSIS — I509 Heart failure, unspecified: Secondary | ICD-10-CM | POA: Diagnosis present

## 2014-05-16 DIAGNOSIS — E1122 Type 2 diabetes mellitus with diabetic chronic kidney disease: Secondary | ICD-10-CM

## 2014-05-16 DIAGNOSIS — J9601 Acute respiratory failure with hypoxia: Secondary | ICD-10-CM

## 2014-05-16 DIAGNOSIS — W19XXXA Unspecified fall, initial encounter: Secondary | ICD-10-CM | POA: Diagnosis present

## 2014-05-16 DIAGNOSIS — Z954 Presence of other heart-valve replacement: Secondary | ICD-10-CM | POA: Diagnosis not present

## 2014-05-16 DIAGNOSIS — E1169 Type 2 diabetes mellitus with other specified complication: Secondary | ICD-10-CM | POA: Diagnosis present

## 2014-05-16 DIAGNOSIS — N182 Chronic kidney disease, stage 2 (mild): Secondary | ICD-10-CM | POA: Diagnosis present

## 2014-05-16 DIAGNOSIS — R0902 Hypoxemia: Secondary | ICD-10-CM | POA: Diagnosis present

## 2014-05-16 DIAGNOSIS — Z853 Personal history of malignant neoplasm of breast: Secondary | ICD-10-CM | POA: Diagnosis not present

## 2014-05-16 DIAGNOSIS — Z7901 Long term (current) use of anticoagulants: Secondary | ICD-10-CM

## 2014-05-16 DIAGNOSIS — E1129 Type 2 diabetes mellitus with other diabetic kidney complication: Secondary | ICD-10-CM | POA: Diagnosis present

## 2014-05-16 DIAGNOSIS — S2232XA Fracture of one rib, left side, initial encounter for closed fracture: Secondary | ICD-10-CM

## 2014-05-16 DIAGNOSIS — Z794 Long term (current) use of insulin: Secondary | ICD-10-CM

## 2014-05-16 DIAGNOSIS — N189 Chronic kidney disease, unspecified: Secondary | ICD-10-CM | POA: Diagnosis present

## 2014-05-16 DIAGNOSIS — S0100XA Unspecified open wound of scalp, initial encounter: Secondary | ICD-10-CM | POA: Diagnosis present

## 2014-05-16 DIAGNOSIS — S0101XA Laceration without foreign body of scalp, initial encounter: Secondary | ICD-10-CM

## 2014-05-16 DIAGNOSIS — Z79899 Other long term (current) drug therapy: Secondary | ICD-10-CM

## 2014-05-16 DIAGNOSIS — E7800 Pure hypercholesterolemia, unspecified: Secondary | ICD-10-CM | POA: Diagnosis present

## 2014-05-16 DIAGNOSIS — Z952 Presence of prosthetic heart valve: Secondary | ICD-10-CM

## 2014-05-16 DIAGNOSIS — Z9181 History of falling: Secondary | ICD-10-CM | POA: Diagnosis not present

## 2014-05-16 DIAGNOSIS — W19XXXD Unspecified fall, subsequent encounter: Secondary | ICD-10-CM

## 2014-05-16 DIAGNOSIS — I5033 Acute on chronic diastolic (congestive) heart failure: Principal | ICD-10-CM | POA: Diagnosis present

## 2014-05-16 DIAGNOSIS — G4733 Obstructive sleep apnea (adult) (pediatric): Secondary | ICD-10-CM | POA: Diagnosis present

## 2014-05-16 DIAGNOSIS — N058 Unspecified nephritic syndrome with other morphologic changes: Secondary | ICD-10-CM | POA: Diagnosis present

## 2014-05-16 DIAGNOSIS — I10 Essential (primary) hypertension: Secondary | ICD-10-CM

## 2014-05-16 DIAGNOSIS — E119 Type 2 diabetes mellitus without complications: Secondary | ICD-10-CM | POA: Diagnosis present

## 2014-05-16 LAB — PROTIME-INR
INR: 2.23 — ABNORMAL HIGH (ref 0.00–1.49)
Prothrombin Time: 24.7 seconds — ABNORMAL HIGH (ref 11.6–15.2)

## 2014-05-16 LAB — COMPREHENSIVE METABOLIC PANEL
ALK PHOS: 88 U/L (ref 39–117)
ALT: 9 U/L (ref 0–35)
ANION GAP: 13 (ref 5–15)
AST: 14 U/L (ref 0–37)
Albumin: 3.9 g/dL (ref 3.5–5.2)
BUN: 42 mg/dL — AB (ref 6–23)
CO2: 28 mEq/L (ref 19–32)
Calcium: 9.7 mg/dL (ref 8.4–10.5)
Chloride: 101 mEq/L (ref 96–112)
Creatinine, Ser: 1.5 mg/dL — ABNORMAL HIGH (ref 0.50–1.10)
GFR, EST AFRICAN AMERICAN: 40 mL/min — AB (ref >90)
GFR, EST NON AFRICAN AMERICAN: 34 mL/min — AB (ref >90)
GLUCOSE: 130 mg/dL — AB (ref 70–99)
POTASSIUM: 4.4 meq/L (ref 3.7–5.3)
Sodium: 142 mEq/L (ref 137–147)
TOTAL PROTEIN: 7.1 g/dL (ref 6.0–8.3)
Total Bilirubin: 1.5 mg/dL — ABNORMAL HIGH (ref 0.3–1.2)

## 2014-05-16 LAB — CBC WITH DIFFERENTIAL/PLATELET
BASOS PCT: 0 % (ref 0–1)
Basophils Absolute: 0 10*3/uL (ref 0.0–0.1)
EOS ABS: 0.2 10*3/uL (ref 0.0–0.7)
Eosinophils Relative: 2 % (ref 0–5)
HEMATOCRIT: 35.2 % — AB (ref 36.0–46.0)
HEMOGLOBIN: 11.4 g/dL — AB (ref 12.0–15.0)
LYMPHS PCT: 29 % (ref 12–46)
Lymphs Abs: 2.4 10*3/uL (ref 0.7–4.0)
MCH: 26.4 pg (ref 26.0–34.0)
MCHC: 32.4 g/dL (ref 30.0–36.0)
MCV: 81.5 fL (ref 78.0–100.0)
MONO ABS: 0.9 10*3/uL (ref 0.1–1.0)
Monocytes Relative: 11 % (ref 3–12)
NEUTROS ABS: 4.7 10*3/uL (ref 1.7–7.7)
Neutrophils Relative %: 58 % (ref 43–77)
Platelets: 115 10*3/uL — ABNORMAL LOW (ref 150–400)
RBC: 4.32 MIL/uL (ref 3.87–5.11)
RDW: 15.8 % — ABNORMAL HIGH (ref 11.5–15.5)
WBC: 8.2 10*3/uL (ref 4.0–10.5)

## 2014-05-16 LAB — GLUCOSE, CAPILLARY: Glucose-Capillary: 230 mg/dL — ABNORMAL HIGH (ref 70–99)

## 2014-05-16 MED ORDER — WARFARIN - PHARMACIST DOSING INPATIENT: Freq: Every day | Status: DC

## 2014-05-16 MED ORDER — WARFARIN SODIUM 5 MG PO TABS
5.0000 mg | ORAL_TABLET | ORAL | Status: DC
Start: 2014-05-17 — End: 2014-05-17
  Filled 2014-05-16: qty 1

## 2014-05-16 MED ORDER — ACETAMINOPHEN 325 MG PO TABS
650.0000 mg | ORAL_TABLET | Freq: Once | ORAL | Status: AC
  Administered 2014-05-16: 650 mg via ORAL
  Filled 2014-05-16: qty 2

## 2014-05-16 MED ORDER — WARFARIN SODIUM 7.5 MG PO TABS
7.5000 mg | ORAL_TABLET | ORAL | Status: DC
  Administered 2014-05-17: 7.5 mg via ORAL

## 2014-05-16 MED ORDER — SODIUM CHLORIDE 0.9 % IJ SOLN: 3.0000 mL | INTRAMUSCULAR | Status: DC | PRN

## 2014-05-16 MED ORDER — INSULIN ASPART 100 UNIT/ML ~~LOC~~ SOLN
0.0000 [IU] | Freq: Three times a day (TID) | SUBCUTANEOUS | Status: DC
  Administered 2014-05-17: 3 [IU] via SUBCUTANEOUS
  Administered 2014-05-18: 2 [IU] via SUBCUTANEOUS

## 2014-05-16 MED ORDER — SODIUM CHLORIDE 0.9 % IJ SOLN
3.0000 mL | Freq: Two times a day (BID) | INTRAMUSCULAR | Status: DC
  Administered 2014-05-17 – 2014-05-18 (×4): 3 mL via INTRAVENOUS

## 2014-05-16 MED ORDER — OXYCODONE HCL 5 MG PO TABS: 5.0000 mg | ORAL_TABLET | ORAL | Status: DC | PRN

## 2014-05-16 MED ORDER — INSULIN ASPART PROT & ASPART (70-30 MIX) 100 UNIT/ML ~~LOC~~ SUSP
30.0000 [IU] | Freq: Two times a day (BID) | SUBCUTANEOUS | Status: DC
  Administered 2014-05-17: 30 [IU] via SUBCUTANEOUS
  Filled 2014-05-16: qty 10

## 2014-05-16 MED ORDER — ACETAMINOPHEN 325 MG PO TABS
650.0000 mg | ORAL_TABLET | ORAL | Status: DC | PRN
  Administered 2014-05-16 – 2014-05-17 (×2): 650 mg via ORAL
  Filled 2014-05-16 (×2): qty 2

## 2014-05-16 MED ORDER — WARFARIN SODIUM 7.5 MG PO TABS
7.5000 mg | ORAL_TABLET | ORAL | Status: DC
  Filled 2014-05-16: qty 1

## 2014-05-16 MED ORDER — FLUTICASONE PROPIONATE 50 MCG/ACT NA SUSP: 2.0000 | Freq: Every day | NASAL | Status: DC | PRN

## 2014-05-16 MED ORDER — FUROSEMIDE 40 MG PO TABS
40.0000 mg | ORAL_TABLET | Freq: Two times a day (BID) | ORAL | Status: DC
  Filled 2014-05-16: qty 1

## 2014-05-16 MED ORDER — MUPIROCIN 2 % EX OINT
1.0000 "application " | TOPICAL_OINTMENT | Freq: Two times a day (BID) | CUTANEOUS | Status: DC
  Administered 2014-05-17 – 2014-05-18 (×2): 1 via TOPICAL
  Filled 2014-05-16 (×2): qty 22

## 2014-05-16 MED ORDER — ONDANSETRON HCL 4 MG/2ML IJ SOLN
4.0000 mg | Freq: Once | INTRAMUSCULAR | Status: AC
  Administered 2014-05-16: 4 mg via INTRAVENOUS
  Filled 2014-05-16: qty 2

## 2014-05-16 MED ORDER — WARFARIN SODIUM 5 MG PO TABS: 5.0000 mg | ORAL_TABLET | ORAL | Status: DC

## 2014-05-16 MED ORDER — ONDANSETRON HCL 4 MG PO TABS: 4.0000 mg | ORAL_TABLET | Freq: Four times a day (QID) | ORAL | Status: DC | PRN

## 2014-05-16 MED ORDER — ONDANSETRON HCL 4 MG/2ML IJ SOLN: 4.0000 mg | Freq: Four times a day (QID) | INTRAMUSCULAR | Status: DC | PRN

## 2014-05-16 MED ORDER — SODIUM CHLORIDE 0.9 % IV SOLN: 250.0000 mL | INTRAVENOUS | Status: DC | PRN

## 2014-05-16 MED ORDER — SODIUM CHLORIDE 0.9 % IJ SOLN
3.0000 mL | Freq: Two times a day (BID) | INTRAMUSCULAR | Status: DC
  Administered 2014-05-17: 3 mL via INTRAVENOUS

## 2014-05-16 MED ORDER — LORATADINE 10 MG PO TABS
10.0000 mg | ORAL_TABLET | Freq: Every morning | ORAL | Status: DC
  Administered 2014-05-17 – 2014-05-18 (×2): 10 mg via ORAL
  Filled 2014-05-16 (×2): qty 1

## 2014-05-16 MED ORDER — CITALOPRAM HYDROBROMIDE 40 MG PO TABS
40.0000 mg | ORAL_TABLET | Freq: Every day | ORAL | Status: DC
  Filled 2014-05-16 (×3): qty 1

## 2014-05-16 NOTE — Progress Notes (Signed)
ANTICOAGULATION CONSULT NOTE - Initial Consult  Pharmacy Consult for coumadin  Indication: Mechanical AV  Allergies  Allergen Reactions  . Demerol Other (See Comments)    Blood pressure goes up  . Meperidine Other (See Comments)    BP fluctuations  . Naprosyn [Naproxen] Swelling    Fluid retention Legs swelling    Patient Measurements: Height: 5' 3.5" (161.3 cm) Weight: 263 lb 14.4 oz (119.704 kg) IBW/kg (Calculated) : 53.55 Heparin Dosing Weight:   Vital Signs: Temp: 98.2 F (36.8 C) (08/17 2256) Temp src: Oral (08/17 2256) BP: 160/59 mmHg (08/17 2256) Pulse Rate: 92 (08/17 2256)  Labs:  Recent Labs  05/16/14 1645  HGB 11.4*  HCT 35.2*  PLT 115*  LABPROT 24.7*  INR 2.23*  CREATININE 1.50*    Estimated Creatinine Clearance: 44.7 ml/min (by C-G formula based on Cr of 1.5).   Medical History: Past Medical History  Diagnosis Date  . Diabetes mellitus   . Hypertension   . High cholesterol   . Breast cancer 11/2003  . Colonic adenoma   . OSA (obstructive sleep apnea)     AHI 45.0/hr, REM sleep was 231 mins.sleep was 77% and 84%, RDI was 21.5/hr total sleep was 2h 58 mins  . Hx of echocardiogram 04/28/2012    EF>55%, Moderate to severe mitral annular calcification, mild mitral regurgitation, mild mitral stenosis, mild tricuspid regurgitation, the prosthetic aortic valve is not well visualized, 21 mm St Jude Regent michanical valve there is aortic root sclerosis/calcification. 25mmhg midcavity gradient.  . History of stress test 04/2008    Normal myocardial perfusion scan demonstrating an attenuationartifact in the anterior region of the myocardium. No ischemia or infarct/scar is seen in the remaining myocardium. compared to the previous study, there is no significant change. No significant ischemia demonstrated. this is a low risk study.  . Aortic stenosis   . H/O aortic valve replacement   . Scalp laceration 12/21/2013  . Traumatic subarachnoid hemorrhage  12/21/2013  . Chronic anemia   . Chronic abdominal pain   . Renal disorder     Stage 3 kidney disease.    Medications:  Prescriptions prior to admission  Medication Sig Dispense Refill  . acetaminophen (TYLENOL) 325 MG tablet Take 2 tablets (650 mg total) by mouth every 4 (four) hours as needed for headache.      . citalopram (CELEXA) 40 MG tablet Take 1 tablet by mouth at bedtime.       . cyanocobalamin (,VITAMIN B-12,) 1000 MCG/ML injection Inject 1,000 mcg into the muscle every 30 (thirty) days.      Marland Kitchen FIBER SELECT GUMMIES CHEW Chew 1 tablet by mouth 2 (two) times daily.      . fluticasone (FLONASE) 50 MCG/ACT nasal spray Place 2 sprays into the nose daily as needed for allergies or rhinitis.       . furosemide (LASIX) 20 MG tablet Take 40 mg by mouth 2 (two) times daily.       . insulin aspart protamine- aspart (NOVOLOG MIX 70/30) (70-30) 100 UNIT/ML injection Inject 30-45 Units into the skin 2 (two) times daily with a meal. Takes 45 units in the morning and 30 units in the evenings      . loratadine (CLARITIN) 10 MG tablet Take 10 mg by mouth every morning.      . mupirocin ointment (BACTROBAN) 2 % Apply 1 application topically 2 (two) times daily. To hand      . warfarin (COUMADIN) 5 MG tablet Take 5-7.5 mg  by mouth See admin instructions. 7.5mg  taken on Mondays,Wednesdasy, and Fridays. Takes 5mg  on all other days        Assessment: pt with frequent falls thought to be mechanical in nature with PT consult pending. Acute hypoxic resp failure with Chronic DHF. Acute on chronic renal disease due to DM2. Morbid obesity. INR is therapeutic. Plt 116 --renal.   Goal of Therapy:  inr 2.5-3.5 Monitor platelets by anticoagulation protocol: Yes   Plan:  Continue home dose of coumadin 7.5mg  MWF and 5 mg all other days.  Daily inr   Curlene Dolphin 05/16/2014,11:39 PM

## 2014-05-16 NOTE — ED Provider Notes (Signed)
CSN: [id number]     Arrival date & time 05/16/14  1612 History   First MD Initiated Contact with Patient 05/16/14 1616     Chief Complaint  Patient presents with  . Head Injury     (Consider location/radiation/quality/duration/timing/severity/associated sxs/prior Treatment) Patient is a 68 y.o. female presenting with head injury. The history is provided by the patient.  Head Injury Location:  R parietal Time since incident:  1 hour Mechanism of injury: fall   Pain details:    Quality:  Sharp   Severity:  Mild   Duration:  1 hour   Timing:  Constant   Progression:  Unchanged Chronicity:  New Relieved by:  Nothing Worsened by:  Nothing tried Ineffective treatments:  None tried Associated symptoms: no headaches, no nausea, no neck pain and no vomiting     Past Medical History  Diagnosis Date  . Diabetes mellitus   . Hypertension   . High cholesterol   . Breast cancer 11/2003  . Colonic adenoma   . OSA (obstructive sleep apnea)     AHI 45.0/hr, REM sleep was 231 mins.sleep was 77% and 84%, RDI was 21.5/hr total sleep was 2h 58 mins  . Hx of echocardiogram 04/28/2012    EF>55%, Moderate to severe mitral annular calcification, mild mitral regurgitation, mild mitral stenosis, mild tricuspid regurgitation, the prosthetic aortic valve is not well visualized, 21 mm St Jude Regent michanical valve there is aortic root sclerosis/calcification. 36mmhg midcavity gradient.  . History of stress test 04/2008    Normal myocardial perfusion scan demonstrating an attenuationartifact in the anterior region of the myocardium. No ischemia or infarct/scar is seen in the remaining myocardium. compared to the previous study, there is no significant change. No significant ischemia demonstrated. this is a low risk study.  . Aortic stenosis   . H/O aortic valve replacement   . Scalp laceration 12/21/2013  . Traumatic subarachnoid hemorrhage 12/21/2013  . Chronic anemia   . Chronic abdominal pain   .  Renal disorder     Stage 3 kidney disease.   Past Surgical History  Procedure Laterality Date  . Breast surgery  12/16/2003  . Aortic valve replacement  08/01/10    By Dr Arvid Right, St Jude AVR with 21 mm St Jude regent mechanical valve  . Colonoscopy  03/04/2003    Dr. Gala Romney- internal hemorrhoids o/w normal rectum, scattered L sided diveritula  . Colonoscopy  04/12/2009    Dr. Imogene Burn polyp, L dided diverticula, tubular adenoma on bx  . Colonoscopy N/A 01/06/2013    RMR; multiple colonic/rectal polyps s/p polypectomy. Fragments of tubular adenomas, needs surveillance Oct 2014  . Colonoscopy N/A 01/18/2013    QQP:YPPJKD polypectomy site with bleeding, s/p bleeding control therapy  . Cardiac catheterization  05/2010    Normal coronary arteries angiographically, normal left ventricular functionultrasonographically and severe aortic stenosis by right cath. Optimal therapy would be elective aortic valve replacement   Family History  Problem Relation Age of Onset  . Heart failure Mother   . CAD    . Diabetes     History  Substance Use Topics  . Smoking status: Never Smoker   . Smokeless tobacco: Never Used  . Alcohol Use: No   OB History   Grav Para Term Preterm Abortions TAB SAB Ect Mult Living                 Review of Systems  Constitutional: Negative for fever and fatigue.  HENT: Negative for congestion and  drooling.   Eyes: Negative for pain.  Respiratory: Negative for cough and shortness of breath.   Cardiovascular: Negative for chest pain.  Gastrointestinal: Negative for nausea, vomiting, abdominal pain and diarrhea.  Genitourinary: Negative for dysuria and hematuria.  Musculoskeletal: Negative for back pain, gait problem and neck pain.  Skin: Negative for color change.  Neurological: Negative for dizziness and headaches.  Hematological: Negative for adenopathy.  Psychiatric/Behavioral: Negative for behavioral problems.  All other systems reviewed and are  negative.     Allergies  Demerol; Meperidine; and Naprosyn  Home Medications   Prior to Admission medications   Medication Sig Start Date End Date Taking? Authorizing Provider  acetaminophen (TYLENOL) 325 MG tablet Take 2 tablets (650 mg total) by mouth every 4 (four) hours as needed for headache. 12/22/13  Yes Lisette Abu, PA-C  citalopram (CELEXA) 40 MG tablet Take 1 tablet by mouth at bedtime.  04/05/13  Yes Historical Provider, MD  cyanocobalamin (,VITAMIN B-12,) 1000 MCG/ML injection Inject 1,000 mcg into the muscle every 30 (thirty) days.   Yes Historical Provider, MD  FIBER SELECT GUMMIES CHEW Chew 1 tablet by mouth 2 (two) times daily.   Yes Historical Provider, MD  fluticasone (FLONASE) 50 MCG/ACT nasal spray Place 2 sprays into the nose daily as needed for allergies or rhinitis.    Yes Historical Provider, MD  furosemide (LASIX) 20 MG tablet Take 40 mg by mouth 2 (two) times daily.  12/28/13  Yes Brittainy Simmons, PA-C  insulin aspart protamine- aspart (NOVOLOG MIX 70/30) (70-30) 100 UNIT/ML injection Inject 30-45 Units into the skin 2 (two) times daily with a meal. Takes 45 units in the morning and 30 units in the evenings   Yes Historical Provider, MD  loratadine (CLARITIN) 10 MG tablet Take 10 mg by mouth every morning.   Yes Historical Provider, MD  mupirocin ointment (BACTROBAN) 2 % Apply 1 application topically 2 (two) times daily. To hand 01/19/14  Yes Lisette Abu, PA-C  warfarin (COUMADIN) 5 MG tablet Take 5-7.5 mg by mouth See admin instructions. 7.5mg  taken on Mondays,Wednesdasy, and Fridays. Takes 5mg  on all other days   Yes Historical Provider, MD   BP 123/51  Pulse 88  Temp(Src) 98 F (36.7 C) (Oral)  Resp 24  SpO2 93% Physical Exam  Nursing note and vitals reviewed. Constitutional: She is oriented to person, place, and time. She appears well-developed and well-nourished.  HENT:  Mouth/Throat: Oropharynx is clear and moist. No oropharyngeal exudate.   3 cm superficial laceration to the right parietal area of the scalp.   Eyes: Conjunctivae and EOM are normal. Pupils are equal, round, and reactive to light.  Neck: Normal range of motion. Neck supple.  No cervical ttp.   Cardiovascular: Normal rate, regular rhythm, normal heart sounds and intact distal pulses.  Exam reveals no gallop and no friction rub.   No murmur heard. Pulmonary/Chest: Effort normal and breath sounds normal. No respiratory distress. She has no wheezes.  Abdominal: Soft. Bowel sounds are normal. There is no tenderness. There is no rebound and no guarding.  Musculoskeletal: Normal range of motion. She exhibits no edema and no tenderness.  Mild upper thoracic and lower lumbar ttp.   Neurological: She is alert and oriented to person, place, and time.  alert, oriented x3 speech: normal in context and clarity memory: intact grossly cranial nerves II-XII: intact motor strength: full proximally and distally no involuntary movements or tremors sensation: intact to light touch diffusely  cerebellar: finger-to-nose and  heel-to-shin intact gait: normal forwards and backwards   Skin: Skin is warm and dry.  Psychiatric: She has a normal mood and affect. Her behavior is normal.    ED Course  LACERATION REPAIR Date/Time: 05/17/2014 12:00 AM Performed by: Pamella Pert Authorized by: Pamella Pert Consent: Verbal consent obtained. written consent not obtained. Risks and benefits: risks, benefits and alternatives were discussed Consent given by: patient Patient understanding: patient states understanding of the procedure being performed Required items: required blood products, implants, devices, and special equipment available Patient identity confirmed: verbally with patient, arm band, provided demographic data, hospital-assigned identification number and anonymous protocol, patient vented/unresponsive Time out: Immediately prior to procedure a "time out" was called  to verify the correct patient, procedure, equipment, support staff and site/side marked as required. Body area: head/neck Location details: scalp Laceration length: 3 cm Foreign bodies: no foreign bodies Tendon involvement: none Nerve involvement: none Vascular damage: no Anesthesia: local infiltration Local anesthetic: lidocaine 1% with epinephrine Anesthetic total: 3 ml Patient sedated: no Preparation: Patient was prepped and draped in the usual sterile fashion. Irrigation solution: saline Irrigation method: jet lavage Amount of cleaning: standard Debridement: none Degree of undermining: none Skin closure: staples Number of sutures: 4 Patient tolerance: Patient tolerated the procedure well with no immediate complications.   (including critical care time) Labs Review Labs Reviewed  CBC WITH DIFFERENTIAL - Abnormal; Notable for the following:    Hemoglobin 11.4 (*)    HCT 35.2 (*)    RDW 15.8 (*)    Platelets 115 (*)    All other components within normal limits  COMPREHENSIVE METABOLIC PANEL - Abnormal; Notable for the following:    Glucose, Bld 130 (*)    BUN 42 (*)    Creatinine, Ser 1.50 (*)    Total Bilirubin 1.5 (*)    GFR calc non Af Amer 34 (*)    GFR calc Af Amer 40 (*)    All other components within normal limits  PROTIME-INR - Abnormal; Notable for the following:    Prothrombin Time 24.7 (*)    INR 2.23 (*)    All other components within normal limits  GLUCOSE, CAPILLARY - Abnormal; Notable for the following:    Glucose-Capillary 230 (*)    All other components within normal limits  BASIC METABOLIC PANEL  CBC  TSH  HEMOGLOBIN A1C  PROTIME-INR  PRO B NATRIURETIC PEPTIDE    Imaging Review Dg Chest 2 View  05/16/2014   CLINICAL DATA:  Post fall 3 weeks ago with pain involving the left anterior rib.  EXAM: CHEST  2 VIEW  COMPARISON:  Chest radiograph- 07/19/2013; 08/21/2010; left rib radiographic series-earlier same day  FINDINGS: The examination is  degraded due to patient body habitus.  Grossly unchanged enlarged cardiac silhouette and mediastinal contours with atherosclerotic plaque within the thoracic aorta and prominence of the central pulmonary vasculature. Post median sternotomy and aortic valve replacement. There is pleural parenchymal thickening about right minor and bilateral major fissures. Mild pulmonary venous congestion without frank evidence of edema. Grossly unchanged bilateral infrahilar heterogeneous opacities favored to represent atelectasis. No new focal airspace opacities. There is persistent mild eventration the right hemidiaphragm. No pleural effusion or pneumothorax. Grossly unchanged bones.  IMPRESSION: 1. Pulmonary venous congestion without frank evidence of edema. 2. Similar findings of cardiomegaly and suspected pulmonary arterial hypertension. Post aortic valve replacement.   Electronically Signed   By: Sandi Mariscal M.D.   On: 05/16/2014 21:22   Dg Ribs Unilateral Left  05/16/2014   CLINICAL DATA:  Left lateral rib pain after fall 3 weeks prior.  EXAM: LEFT RIBS - 2 VIEW  COMPARISON:  None.  FINDINGS: Mild angulation of the anterior left fifth and sixth ribs without discrete acute fracture line. There is no evidence of left hemothorax or pneumothorax.  IMPRESSION: Age-indeterminate nondisplaced fractures of the anterior left fifth and sixth ribs.   Electronically Signed   By: Jorje Guild M.D.   On: 05/16/2014 21:25   Dg Thoracic Spine W/swimmers  05/16/2014   CLINICAL DATA:  [place].  Back pain.  EXAM: LUMBAR SPINE - COMPLETE 4+ VIEW; THORACIC SPINE - 2 VIEW + SWIMMERS  COMPARISON:  CT scan 10/29/2013  FINDINGS: Lumbar spine:  Normal alignment of the lumbar vertebral bodies. No acute fracture or bone lesion. Stable degenerative disc disease at L4-5. Stable lower lumbar facet disease without definite pars defects. The visualized bony pelvis is grossly intact.  Thoracic spine:  Normal alignment of the thoracic vertebral bodies.  No definite acute fracture is identified. Moderate stable degenerative changes when compared to a prior lateral chest x-ray from 2014.  IMPRESSION: Normal alignment and no acute bony findings.   Electronically Signed   By: Kalman Jewels M.D.   On: 05/16/2014 18:55   Dg Lumbar Spine Complete  05/16/2014   CLINICAL DATA:  [place].  Back pain.  EXAM: LUMBAR SPINE - COMPLETE 4+ VIEW; THORACIC SPINE - 2 VIEW + SWIMMERS  COMPARISON:  CT scan 10/29/2013  FINDINGS: Lumbar spine:  Normal alignment of the lumbar vertebral bodies. No acute fracture or bone lesion. Stable degenerative disc disease at L4-5. Stable lower lumbar facet disease without definite pars defects. The visualized bony pelvis is grossly intact.  Thoracic spine:  Normal alignment of the thoracic vertebral bodies. No definite acute fracture is identified. Moderate stable degenerative changes when compared to a prior lateral chest x-ray from 2014.  IMPRESSION: Normal alignment and no acute bony findings.   Electronically Signed   By: Kalman Jewels M.D.   On: 05/16/2014 18:55   Ct Head Wo Contrast  05/16/2014   CLINICAL DATA:  [place], hit head.  Large contusion.  EXAM: CT HEAD WITHOUT CONTRAST  TECHNIQUE: Contiguous axial images were obtained from the base of the skull through the vertex without contrast.  COMPARISON:  04/25/2014.  FINDINGS: There is a moderate-sized RIGHT frontoparietal scalp hematoma. There is no skull fracture or intracranial hemorrhage. Mild cerebral and cerebellar atrophy. Basal ganglia calcification is stable. Hypoattenuation of white matter suggesting chronic microvascular ischemic change. Paranasal sinuses and mastoids are clear.  Compared with prior examination, there is no significant change except that the LEFT frontal scalp hematoma has resolved.  IMPRESSION: Moderate-sized RIGHT frontoparietal scalp hematoma. No skull fracture or intracranial hemorrhage.   Electronically Signed   By: Rolla Flatten M.D.   On: 05/16/2014 17:08      EKG Interpretation   Date/Time:  Monday May 16 2014 17:21:18 EDT Ventricular Rate:  90 PR Interval:  167 QRS Duration: 171 QT Interval:  431 QTC Calculation: 527 R Axis:   104 Text Interpretation:  Sinus rhythm RBBB and LPFB No significant change  since last tracing Confirmed by Ayomide Zuleta  MD, Chantry Headen ([id number]) on 05/16/2014  6:34:23 PM      MDM   Final diagnoses:  Fall, initial encounter  Hypoxia  Scalp laceration, initial encounter    4:37 PM 68 y.o. female w hx of Dm, HTN, aortic stenosis, traumatic SAH in the past who pw a fall  which occurred around 3:30 PM today. The patient states that she was walking in a store when her right leg gave out and she fell hitting the right the right parietal area of her head. She denies any loss of consciousness. She currently only has mild superficial stinging in the area of the laceration but denies any headache. She is afebrile and vital signs are unremarkable here. Will get screening labs and imaging. She notes that she does have a history of her right leg giving out and falls due to this. She has no weakness or numbness on exam.  Pt hypoxic on exam. O2 sat 88% on RA. CXR and lat ribs ordered d/t left sided rib pain after fall several weeks ago. Pt found to have age 67 fx of left 5th and 6th rib. Possibly related to hypoxia. Will plan for admission.   Pt to f/u w/ pcp or here in 7-10 days for staple removal.   Pamella Pert, MD 05/17/14 0002

## 2014-05-16 NOTE — H&P (Addendum)
[facility] History and Physical  Hailey Curry ZWC:[id number] DOB: 21-Dec-1945 DOA: 05/16/2014  Referring physician: ED physician PCP: Purvis Kilts, MD   Chief Complaint: fall, hypoxia  HPI:  Pt is 68 yo female with HTN, CKD stage II - III, DM, presented to [facility] ED after an episode of fall earlier today while shopping, she says her knee gave out. She explains similar events in the past, but this time she hit her head and noted some blood. She currently denies any chest pain or shortness of breath and denies any episodes of it prior to the fall. She also denies abd or urinary concerns. She has seen Dr. [name] earlier in the clinic for evaluation of CKD and due to LE edema her Lasix was changed to 80 mg in AM and 40 mg in the PM.  In ED, pt was stable hemodynamically and scalp hematoma was repaired by ED doctor. However, pt noted to be hypoxic with oxygen on RA in 80's and [facility] asked to admit for observation and evaluation.   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 Gwenlyn Found in AM, he is pt's cardiologist and she wants to make him aware  that she has been hospitalized  - monitor daily weights, I's and O's Morbid obesity  - PT evaluation and consider nutritionist consultation  Status post aortic valve replacement  - continue coumadin per pharmacy  Radiological Exams on Admission:  CXR  05/16/2014  Pulmonary venous congestion without frank evidence of edema. Similar findings of cardiomegaly and suspected pulmonary arterial hypertension. Post aortic valve replacement.     Dg Ribs Unilateral Left  05/16/2014  Age-indeterminate nondisplaced fractures of the anterior left fifth and sixth ribs.    Dg Thoracic Spine W/swimmers  05/16/2014  Normal alignment and no acute bony findings.    Dg Lumbar Spine Complete  05/16/2014  Normal alignment and no acute bony findings.   Ct Head Wo Contrast   05/16/2014  Moderate-sized RIGHT frontoparietal scalp hematoma    Code Status: Full Family Communication: Pt at bedside Disposition Plan: Admit for further evaluation     Review of Systems:  Constitutional: Negative for fever, chills and malaise/fatigue. Negative for diaphoresis.  HENT: Negative for hearing loss, ear pain, nosebleeds, congestion, sore throat, neck pain, tinnitus and ear discharge.   Eyes: Negative for blurred vision, double vision, photophobia, pain, discharge and redness.  Respiratory: Negative for cough, hemoptysis, sputum production, shortness of breath, wheezing and stridor.   Cardiovascular: Negative for chest pain, palpitations, orthopnea, claudication Gastrointestinal: Negative for nausea, vomiting and abdominal pain.  Genitourinary: Negative for dysuria, urgency, frequency, hematuria and flank pain.  Musculoskeletal: Negative for myalgias, back pain, joint pain and falls.  Skin: Negative for itching and rash.  Neurological: Negative for dizziness and weakness. Negative for tingling, tremors, sensory change, speech change, focal weakness, loss of consciousness and headaches.  Endo/Heme/Allergies: Negative for  environmental allergies and polydipsia. Does not bruise/bleed easily.  Psychiatric/Behavioral: Negative for suicidal ideas. The patient is not nervous/anxious.      Past Medical History  Diagnosis Date  . Diabetes mellitus   . Hypertension   . High cholesterol   . Breast cancer 11/2003  . Colonic adenoma   . OSA (obstructive sleep apnea)     AHI 45.0/hr, REM sleep was 231 mins.sleep was 77% and 84%, RDI was 21.5/hr total sleep was 2h 58 mins  . Hx of echocardiogram 04/28/2012    EF>55%, Moderate to severe mitral annular calcification, mild mitral regurgitation, mild mitral stenosis, mild tricuspid regurgitation, the prosthetic aortic valve is not well visualized, 21 mm St Jude Regent michanical valve there is aortic root sclerosis/calcification. 85mmhg midcavity gradient.  . History of stress test 04/2008    Normal myocardial perfusion scan demonstrating an attenuationartifact in the anterior region of the myocardium. No ischemia or infarct/scar is seen in the remaining myocardium. compared to the previous study, there is no significant change. No significant ischemia demonstrated. this is a low risk study.  . Aortic stenosis   . H/O aortic valve replacement   . Scalp laceration 12/21/2013  . Traumatic subarachnoid hemorrhage 12/21/2013  . Chronic anemia   . Chronic abdominal pain   . Renal disorder     Stage 3 kidney disease.    Past Surgical History  Procedure Laterality Date  . Breast surgery  12/16/2003  . Aortic valve replacement  08/01/10    By Dr Arvid Right, St Jude AVR with 21 mm St Jude regent mechanical valve  . Colonoscopy  03/04/2003    Dr. Gala Romney- internal hemorrhoids o/w normal rectum, scattered L sided diveritula  . Colonoscopy  04/12/2009    Dr. Imogene Burn polyp, L dided diverticula, tubular adenoma on bx  . Colonoscopy N/A 01/06/2013    RMR; multiple colonic/rectal polyps s/p polypectomy. Fragments of tubular adenomas, needs surveillance Oct 2014  . Colonoscopy N/A  01/18/2013    [id number]:[id number] polypectomy site with bleeding, s/p bleeding control therapy  . Cardiac catheterization  05/2010    Normal coronary arteries angiographically, normal left ventricular functionultrasonographically and severe aortic stenosis by right cath. Optimal therapy would be elective aortic valve replacement    Social History:  reports that she has never smoked. She has never used smokeless tobacco. She reports that she does not drink alcohol or use illicit drugs.  Allergies  Allergen Reactions  . Demerol Other (See Comments)    Blood pressure goes up  . Meperidine Other (See Comments)    BP fluctuations  . Naprosyn [Naproxen] Swelling    Fluid retention Legs swelling    Family History  Problem Relation Age of Onset  . Heart failure Mother   . CAD    . Diabetes      Prior to Admission medications   Medication Sig Start Date End Date Taking? Authorizing Provider  acetaminophen (TYLENOL) 325 MG tablet Take 2 tablets (650 mg total) by mouth every 4 (four) hours as needed for headache. 12/22/13  Yes Lisette Abu, PA-C  citalopram (CELEXA) 40 MG tablet Take 1 tablet by mouth at bedtime.  04/05/13  Yes Historical Provider, MD  cyanocobalamin (,VITAMIN B-12,) 1000 MCG/ML injection Inject 1,000  mcg into the muscle every 30 (thirty) days.   Yes Historical Provider, MD  FIBER SELECT GUMMIES CHEW Chew 1 tablet by mouth 2 (two) times daily.   Yes Historical Provider, MD  fluticasone (FLONASE) 50 MCG/ACT nasal spray Place 2 sprays into the nose daily as needed for allergies or rhinitis.    Yes Historical Provider, MD  furosemide (LASIX) 20 MG tablet Take 40 mg by mouth 2 (two) times daily.  12/28/13  Yes Brittainy Simmons, PA-C  insulin aspart protamine- aspart (NOVOLOG MIX 70/30) (70-30) 100 UNIT/ML injection Inject 30-45 Units into the skin 2 (two) times daily with a meal. Takes 45 units in the morning and 30 units in the evenings   Yes Historical Provider, MD  loratadine  (CLARITIN) 10 MG tablet Take 10 mg by mouth every morning.   Yes Historical Provider, MD  mupirocin ointment (BACTROBAN) 2 % Apply 1 application topically 2 (two) times daily. To hand 01/19/14  Yes Lisette Abu, PA-C  warfarin (COUMADIN) 5 MG tablet Take 5-7.5 mg by mouth See admin instructions. 7.5mg  taken on Mondays,Wednesdasy, and Fridays. Takes 5mg  on all other days   Yes Historical Provider, MD    Physical Exam: Filed Vitals:   05/16/14 1945 05/16/14 2014 05/16/14 2030 05/16/14 2045  BP: 150/53 150/53 135/50 147/73  Pulse: 97 95 93 95  Temp:      TempSrc:      Resp: 23 19 18 24   SpO2: 88% 97% 88% 96%    Physical Exam  Constitutional: Appears well-developed and well-nourished. No distress.  HENT: Normocephalic. External right and left ear normal. Oropharynx is clear and moist.  Eyes: Conjunctivae and EOM are normal. PERRLA, no scleral icterus.  Neck: Normal ROM. Neck supple. No JVD. No tracheal deviation. No thyromegaly.  CVS: RRR, S1/S2 +, no murmurs, no gallops, no carotid bruit.  Pulmonary: Effort and breath sounds normal, no stridor, rhonchi, wheezes, rales.  Abdominal: Soft. BS +,  no distension, tenderness, rebound or guarding.  Musculoskeletal: Normal range of motion. +2 bilateral LE edema  Lymphadenopathy: No lymphadenopathy noted, cervical, inguinal. Neuro: Alert. Normal reflexes, muscle tone coordination. No cranial nerve deficit. Skin: Skin is warm and dry. No rash noted. Not diaphoretic. No erythema. No pallor.  Psychiatric: Normal mood and affect. Behavior, judgment, thought content normal.   Labs on Admission:  Basic Metabolic Panel:  Recent Labs Lab 05/16/14 1645  NA 142  K 4.4  CL 101  CO2 28  GLUCOSE 130*  BUN 42*  CREATININE 1.50*  CALCIUM 9.7   Liver Function Tests:  Recent Labs Lab 05/16/14 1645  AST 14  ALT 9  ALKPHOS 88  BILITOT 1.5*  PROT 7.1  ALBUMIN 3.9   CBC:  Recent Labs Lab 05/16/14 1645  WBC 8.2  NEUTROABS 4.7  HGB  11.4*  HCT 35.2*  MCV 81.5  PLT 115*   EKG: Normal sinus rhythm, no ST/T wave changes  Faye Ramsay, MD  [facility] Pager [phone]  If 7PM-7AM, please contact night-coverage [email or web address] Password [facility] 05/16/2014, 9:49 PM

## 2014-05-16 NOTE — Telephone Encounter (Signed)
Returned [employer] phone call about how many [employer] she could have.Left a voicemail that said that one per day would be reasonable and if she has 2 some days that would be ok too. Encouraged her to drink mostly water.

## 2014-05-16 NOTE — ED Notes (Signed)
Pt removed off 02 to see check sats; pt stayed at 90-91% RA; pt placed back on 2 l/m 02; sats at 96% with 02

## 2014-05-16 NOTE — ED Notes (Signed)
MD at bedside. 

## 2014-05-16 NOTE — ED Notes (Signed)
Attempt made to call report; RN to call back

## 2014-05-16 NOTE — ED Notes (Signed)
Pt reports to the ED via [facility] for eval of fall and head injury. Pt had some blood drawn this morning at her Nephrologist. She has hx of stage 3 CKD with some associated dependent swelling. Pt reports she was shopping today when her knee gave out and she fell and struck her head against a shelf. Per EMS pt has a sizable contusion and laceration present. Clean dressing present upon pt arrival. Bleeding controlled. Pt denies any LOC. Pt is on blood thinners. Pt denies any neck or back pain. Pt A&Ox4, resp e/u, and skin warm and dry.

## 2014-05-16 NOTE — ED Notes (Signed)
Patient states she is feeling nauseous and her nose is getting stuffy.  Will notify EDP.

## 2014-05-16 NOTE — ED Notes (Signed)
Pts O2 86% on RA. Pt placed on 2 L via nasal cannula. O2 sats now > 90%. EDP made aware.

## 2014-05-17 DIAGNOSIS — N189 Chronic kidney disease, unspecified: Secondary | ICD-10-CM | POA: Diagnosis present

## 2014-05-17 DIAGNOSIS — I10 Essential (primary) hypertension: Secondary | ICD-10-CM

## 2014-05-17 DIAGNOSIS — J9601 Acute respiratory failure with hypoxia: Secondary | ICD-10-CM | POA: Diagnosis present

## 2014-05-17 DIAGNOSIS — R109 Unspecified abdominal pain: Secondary | ICD-10-CM

## 2014-05-17 DIAGNOSIS — E1129 Type 2 diabetes mellitus with other diabetic kidney complication: Secondary | ICD-10-CM

## 2014-05-17 DIAGNOSIS — R0902 Hypoxemia: Secondary | ICD-10-CM

## 2014-05-17 DIAGNOSIS — W19XXXA Unspecified fall, initial encounter: Secondary | ICD-10-CM

## 2014-05-17 LAB — GLUCOSE, CAPILLARY
GLUCOSE-CAPILLARY: 214 mg/dL — AB (ref 70–99)
GLUCOSE-CAPILLARY: 80 mg/dL (ref 70–99)
GLUCOSE-CAPILLARY: 145 mg/dL — AB (ref 70–99)
Glucose-Capillary: 65 mg/dL — ABNORMAL LOW (ref 70–99)
Glucose-Capillary: 165 mg/dL — ABNORMAL HIGH (ref 70–99)

## 2014-05-17 LAB — TSH: TSH: 1.65 u[IU]/mL (ref 0.350–4.500)

## 2014-05-17 LAB — BASIC METABOLIC PANEL
Anion gap: 16 — ABNORMAL HIGH (ref 5–15)
BUN: 42 mg/dL — ABNORMAL HIGH (ref 6–23)
CHLORIDE: 103 meq/L (ref 96–112)
CO2: 24 meq/L (ref 19–32)
Calcium: 9.5 mg/dL (ref 8.4–10.5)
Creatinine, Ser: 1.58 mg/dL — ABNORMAL HIGH (ref 0.50–1.10)
GFR calc Af Amer: 37 mL/min — ABNORMAL LOW (ref >90)
GFR calc non Af Amer: 32 mL/min — ABNORMAL LOW (ref >90)
GLUCOSE: 164 mg/dL — AB (ref 70–99)
Potassium: 4.9 mEq/L (ref 3.7–5.3)
SODIUM: 143 meq/L (ref 137–147)

## 2014-05-17 LAB — HEMOGLOBIN A1C
HEMOGLOBIN A1C: 6.3 % — AB (ref <5.7)
MEAN PLASMA GLUCOSE: 134 mg/dL — AB (ref <117)

## 2014-05-17 LAB — CBC
HEMATOCRIT: 35 % — AB (ref 36.0–46.0)
HEMOGLOBIN: 11.1 g/dL — AB (ref 12.0–15.0)
MCH: 26.1 pg (ref 26.0–34.0)
MCHC: 31.7 g/dL (ref 30.0–36.0)
MCV: 82.2 fL (ref 78.0–100.0)
Platelets: 115 10*3/uL — ABNORMAL LOW (ref 150–400)
RBC: 4.26 MIL/uL (ref 3.87–5.11)
RDW: 15.9 % — AB (ref 11.5–15.5)
WBC: 6.7 10*3/uL (ref 4.0–10.5)

## 2014-05-17 LAB — PROTIME-INR
INR: 1.96 — AB (ref 0.00–1.49)
Prothrombin Time: 22.3 seconds — ABNORMAL HIGH (ref 11.6–15.2)

## 2014-05-17 LAB — PRO B NATRIURETIC PEPTIDE: PRO B NATRI PEPTIDE: 606.5 pg/mL — AB (ref 0–125)

## 2014-05-17 MED ORDER — FUROSEMIDE 80 MG PO TABS
80.0000 mg | ORAL_TABLET | Freq: Two times a day (BID) | ORAL | Status: DC
  Filled 2014-05-17 (×3): qty 1

## 2014-05-17 MED ORDER — FUROSEMIDE 10 MG/ML IJ SOLN: 40.0000 mg | Freq: Three times a day (TID) | INTRAMUSCULAR | Status: DC

## 2014-05-17 MED ORDER — FUROSEMIDE 10 MG/ML IJ SOLN
40.0000 mg | Freq: Three times a day (TID) | INTRAMUSCULAR | Status: AC
  Administered 2014-05-17 – 2014-05-18 (×3): 40 mg via INTRAVENOUS
  Filled 2014-05-17 (×2): qty 4

## 2014-05-17 MED ORDER — FUROSEMIDE 10 MG/ML IJ SOLN
40.0000 mg | Freq: Three times a day (TID) | INTRAMUSCULAR | Status: DC
  Administered 2014-05-17: 40 mg via INTRAVENOUS
  Filled 2014-05-17 (×2): qty 4

## 2014-05-17 MED ORDER — INSULIN ASPART PROT & ASPART (70-30 MIX) 100 UNIT/ML ~~LOC~~ SUSP
27.0000 [IU] | Freq: Two times a day (BID) | SUBCUTANEOUS | Status: DC
  Administered 2014-05-17 – 2014-05-18 (×2): 27 [IU] via SUBCUTANEOUS
  Filled 2014-05-17: qty 10

## 2014-05-17 MED ORDER — WARFARIN SODIUM 7.5 MG PO TABS
7.5000 mg | ORAL_TABLET | Freq: Once | ORAL | Status: AC
  Administered 2014-05-17: 7.5 mg via ORAL
  Filled 2014-05-17: qty 1

## 2014-05-17 NOTE — Progress Notes (Signed)
UR completed. Sam Overbeck RN CCM Case Mgmt phone [phone] 

## 2014-05-17 NOTE — Evaluation (Signed)
Physical Therapy Evaluation Patient Details Name: Hailey Curry MRN: [id number] DOB: [birth year] Today's Date: 05/17/2014   History of Present Illness  Pt is 68 yo female with HTN, CKD stage II - III, DM, presented to [facility] ED after an episode of fall earlier today while shopping, she says her knee gave out. She explains similar events in the past, but this time she hit her head and noted some blood. She currently denies any chest pain or shortness of breath and denies any episodes of it prior to the fall. Scalp lac repaired in ED, noted pt hypoxic with sats on Room Air in the 80s  Clinical Impression  Pt admitted with above. Pt currently with functional limitations due to the deficits listed below (see PT Problem List).  Pt will benefit from skilled PT to increase their independence and safety with mobility to allow discharge to the venue listed below.       Follow Up Recommendations Home health PT;Supervision/Assistance - 24 hour [facility] for chrinic disease management)    Equipment Recommendations  None recommended by PT    Recommendations for Other Services       Precautions / Restrictions Precautions Precaution Comments: watch O2 sats Restrictions Weight Bearing Restrictions: No      Mobility  Bed Mobility                  Transfers Overall transfer level: Needs assistance Equipment used: None Transfers: Sit to/from Stand Sit to Stand: Supervision         General transfer comment: Supervision mostly because of history of falls  Ambulation/Gait Ambulation/Gait assistance: Supervision Ambulation Distance (Feet): 120 Feet Assistive device: Rolling walker (2 wheeled);None Gait Pattern/deviations: Decreased stride length     General Gait Details: Much more steady with RW, noted pt tends to reach out for UE support when walking without assistive device; Cues to self-monitor for activity tolerance; it seems she is not aware of when her sats drop  Stairs             Wheelchair Mobility    Modified Rankin (Stroke Patients Only)       Balance Overall balance assessment: History of Falls                                           Pertinent Vitals/Pain Pain Assessment: No/denies pain    Home Living Family/patient expects to be discharged to:: Private residence Living Arrangements: Spouse/significant other Available Help at Discharge: Family Type of Home: House Home Access: Stairs to enter Entrance Stairs-Rails: Psychiatric nurse of Steps: 2 Home Layout: One level Home Equipment: Environmental consultant - 2 wheels      Prior Function Level of Independence: Independent with assistive device(s)               Hand Dominance        Extremity/Trunk Assessment   Upper Extremity Assessment: Overall WFL for tasks assessed           Lower Extremity Assessment: Overall WFL for tasks assessed         Communication   Communication: No difficulties  Cognition Arousal/Alertness: Awake/alert Behavior During Therapy: WFL for tasks assessed/performed Overall Cognitive Status: Within Functional Limits for tasks assessed                      General Comments  Exercises        Assessment/Plan    PT Assessment Patient needs continued PT services  PT Diagnosis Generalized weakness;Other (comment) (decr functional capacity)   PT Problem List Decreased activity tolerance;Cardiopulmonary status limiting activity;Decreased knowledge of precautions  PT Treatment Interventions DME instruction;Gait training;Stair training;Functional mobility training;Therapeutic activities;Therapeutic exercise;Patient/family education   PT Goals (Current goals can be found in the Care Plan section) Acute Rehab PT Goals Patient Stated Goal: home PT Goal Formulation: With patient Time For Goal Achievement: 05/31/14 Potential to Achieve Goals: Good    Frequency Min 3X/week   Barriers to discharge         Co-evaluation               End of Session Equipment Utilized During Treatment: Gait belt;Oxygen Activity Tolerance: Patient tolerated treatment well Patient left: in chair;with call bell/phone within reach;with family/visitor present Nurse Communication: Mobility status;Other (comment) (O2 sats with amb)         Time: 6962-9528 PT Time Calculation (min): 20 min   Charges:   PT Evaluation $Initial PT Evaluation Tier I: 1 Procedure PT Treatments $Gait Training: 8-22 mins   PT G Codes:          Quin Hoop 05/17/2014, 12:42 PM  Roney Marion, [place] Pager [phone] Office [phone]

## 2014-05-17 NOTE — Progress Notes (Signed)
Blood sugar was 65. Pt had apple juice. Recheck bs was 80. Pt is eating lunch now. Will closely monitor pt.

## 2014-05-17 NOTE — Progress Notes (Signed)
ANTICOAGULATION CONSULT NOTE -Follow up  Pharmacy Consult for coumadin  Indication: Mechanical AV  Allergies  Allergen Reactions  . Demerol Other (See Comments)    Blood pressure goes up  . Meperidine Other (See Comments)    BP fluctuations  . Naprosyn [Naproxen] Swelling    Fluid retention Legs swelling    Patient Measurements: Height: 5' 3.5" (161.3 cm) Weight: 263 lb 14.4 oz (119.704 kg) IBW/kg (Calculated) : 53.55 Heparin Dosing Weight:   Vital Signs: Temp: 98.3 F (36.8 C) (08/18 1314) Temp src: Oral (08/18 1314) BP: 128/48 mmHg (08/18 1314) Pulse Rate: 78 (08/18 1314)  Labs:  Recent Labs  05/16/14 1645 05/17/14 0558  HGB 11.4* 11.1*  HCT 35.2* 35.0*  PLT 115* 115*  LABPROT 24.7* 22.3*  INR 2.23* 1.96*  CREATININE 1.50* 1.58*    Estimated Creatinine Clearance: 42.4 ml/min (by C-G formula based on Cr of 1.58).   Medical History: Past Medical History  Diagnosis Date  . Diabetes mellitus   . Hypertension   . High cholesterol   . Breast cancer 11/2003  . Colonic adenoma   . OSA (obstructive sleep apnea)     AHI 45.0/hr, REM sleep was 231 mins.sleep was 77% and 84%, RDI was 21.5/hr total sleep was 2h 58 mins  . Hx of echocardiogram 04/28/2012    EF>55%, Moderate to severe mitral annular calcification, mild mitral regurgitation, mild mitral stenosis, mild tricuspid regurgitation, the prosthetic aortic valve is not well visualized, 21 mm St Jude Regent michanical valve there is aortic root sclerosis/calcification. 82mmhg midcavity gradient.  . History of stress test 04/2008    Normal myocardial perfusion scan demonstrating an attenuationartifact in the anterior region of the myocardium. No ischemia or infarct/scar is seen in the remaining myocardium. compared to the previous study, there is no significant change. No significant ischemia demonstrated. this is a low risk study.  . Aortic stenosis   . H/O aortic valve replacement   . Scalp laceration 12/21/2013   . Traumatic subarachnoid hemorrhage 12/21/2013  . Chronic anemia   . Chronic abdominal pain   . Renal disorder     Stage 3 kidney disease.    Medications:  Prescriptions prior to admission  Medication Sig Dispense Refill  . acetaminophen (TYLENOL) 325 MG tablet Take 2 tablets (650 mg total) by mouth every 4 (four) hours as needed for headache.      . citalopram (CELEXA) 40 MG tablet Take 1 tablet by mouth at bedtime.       . cyanocobalamin (,VITAMIN B-12,) 1000 MCG/ML injection Inject 1,000 mcg into the muscle every 30 (thirty) days.      Marland Kitchen FIBER SELECT GUMMIES CHEW Chew 1 tablet by mouth 2 (two) times daily.      . fluticasone (FLONASE) 50 MCG/ACT nasal spray Place 2 sprays into the nose daily as needed for allergies or rhinitis.       . furosemide (LASIX) 20 MG tablet Take 40 mg by mouth 2 (two) times daily.       . insulin aspart protamine- aspart (NOVOLOG MIX 70/30) (70-30) 100 UNIT/ML injection Inject 30-45 Units into the skin 2 (two) times daily with a meal. Takes 45 units in the morning and 30 units in the evenings      . loratadine (CLARITIN) 10 MG tablet Take 10 mg by mouth every morning.      . mupirocin ointment (BACTROBAN) 2 % Apply 1 application topically 2 (two) times daily. To hand      . warfarin (  COUMADIN) 5 MG tablet Take 5-7.5 mg by mouth See admin instructions. 7.5mg  taken on Mondays,Wednesdasy, and Fridays. Takes 5mg  on all other days        Assessment: 68 y.o female on coumadin PTA for mechanical AVR with subtherapeutic INR today of 1.96.  Admitted with frequent falls thought to be mechanical in nature. Acute hypoxic resp failure multifactorial, secondary to acute on chronic diastolic CHF, underlying obesity hypoventilation syndrome.  Acute on chronic renal disease due to DM2. Morbid obesity.  Hgb 11.1, stable. Pltc stable, 115K. No bleeding noted.  Home coumadin dose was 7.5 mg every MWF and 5 mg every TTSS. Last taken PTA on 8/16.  Usual dose of 7.5mg  given last  night (8/17).  INR 2.23 on admit and now INR down to 1.96.  I will give higher dose of 7.5mg  tonight instead of usual home dose dose of 5mg .  Goal of Therapy:  INR 2.5-3.5 Monitor platelets by anticoagulation protocol: Yes   Plan:  Give coumadin 7.5mg  x1  today.   Daily INR  Nicole Cella, RPh Clinical Pharmacist Pager: [phone] 05/17/2014,1:32 PM

## 2014-05-17 NOTE — Progress Notes (Signed)
Physical Therapy  Note  SATURATION QUALIFICATIONS: (This note is used to comply with regulatory documentation for home oxygen)  Patient Saturations on Room Air at Rest = 90%  Patient Saturations on Room Air while Ambulating = 82%  Patient Saturations on 2-3 Liters of oxygen while Ambulating = 89-92%  Please briefly explain why patient needs home oxygen: Patient requires supplemental oxygen to maintain oxygen saturation at safe, acceptable levels during activity.  See PT eval for other details of session;  Roney Marion, [place] Pager [phone] Office [phone]

## 2014-05-17 NOTE — Progress Notes (Signed)
Patient ID: Hailey Curry  female  WUX:[id number]    DOB: May 18, 1946    DOA: 05/16/2014  PCP: Purvis Kilts, MD  Assessment/Plan: Principal Problem: Acute hypoxic respiratory failure - multifactorial, secondary to acute on chronic diastolic CHF, underlying obesity hypoventilation syndrome  - Patient reported that Dr. [name] had just increased her Lasix yesterday to 80mg  AM, 40mg  Noon due to fluid overload. She is to followup with him back in one week for further adjustment of Lasix.  However patient has not received any Lasix since admission. On oxygen 3 L via nasal cannula, does not wear any oxygen at home - I have placed her on Lasix 40 mg IV q8hours, will reassess in a.m. Currently +600cc, BNP 606, Chest x-ray positive for pulmonary congestion. If patient back to baseline, transition to oral Lasix per Dr Deterding's rec's.   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 : Hypoglycemia episode today  - continue Insulin Novolog BID as per home medical regimen  -  Decreased Novolog 70/30 to 27units BID   Acute on Chronic diastolic CHF  - last 2 D ECHO in May 2015 EF 25-36%, grade 1 diastolic dysfunction, continue IV Lasix - Cardiology consult if needed  Morbid obesity  -Patient counseled on diet and weight control  Status post aortic valve replacement  - continue coumadin per pharmacy   DVT Prophylaxis:Coumadin   Code Status:Full code   Family Communication:Discussed with patient's husband at the bedside   Disposition:Hopefully next 24-48 hours, will need home O2 evaluation prior to DC   Consultants:  None   Procedures:  None    Antibiotics:  None    Subjective: Patient seen and examined, still somewhat short of breath, or O2 3 L, does not wear oxygen at home   Objective: Weight change:   Intake/Output Summary (Last 24 hours) at 05/17/14 1303 Last data filed at 05/17/14 0900  Gross per 24 hour  Intake    600 ml  Output      0 ml  Net    600 ml   Blood pressure 129/50, pulse 84, temperature 97.7 F (36.5 C), temperature source Oral, resp. rate 20, height 5' 3.5" (1.613 m), weight 119.704 kg (263 lb 14.4 oz), SpO2 95.00%.  Physical Exam: General: Alert and awake, oriented x3, not in any acute distress. CVS: S1-S2 clear, no murmur rubs or gallops Chest:  decreased breath sounds at the bases  Abdomen: soft nontender, nondistended, normal bowel sounds  Extremities: no cyanosis, clubbing, 2+ edema noted bilaterally Neuro: Cranial nerves II-XII intact, no focal neurological deficits  Lab Results: Basic Metabolic Panel:  Recent Labs Lab 05/16/14 1645 05/17/14 0558  NA 142 143  K 4.4 4.9  CL 101 103  CO2 28 24  GLUCOSE 130* 164*  BUN 42* 42*  CREATININE 1.50* 1.58*  CALCIUM 9.7 9.5   Liver Function Tests:  Recent Labs Lab 05/16/14 1645  AST 14  ALT 9  ALKPHOS 88  BILITOT 1.5*  PROT 7.1  ALBUMIN 3.9   No results found for this basename: LIPASE, AMYLASE,  in the last 168 hours No results found for this basename: AMMONIA,  in  the last 168 hours CBC:  Recent Labs Lab 05/16/14 1645 05/17/14 0558  WBC 8.2 6.7  NEUTROABS 4.7  --   HGB 11.4* 11.1*  HCT 35.2* 35.0*  MCV 81.5 82.2  PLT 115* 115*   Cardiac Enzymes: No results found for this basename: CKTOTAL, CKMB, CKMBINDEX, TROPONINI,  in the last 168 hours BNP: No components found with this basename: POCBNP,  CBG:  Recent Labs Lab 05/16/14 2245 05/17/14 0838 05/17/14 1134 05/17/14 1156  GLUCAP 230* 214* 65* 80     Micro Results: No results found for this or any previous visit (from the past 240  hour(s)).  Studies/Results: Dg Chest 2 View  05/16/2014   CLINICAL DATA:  Post fall 3 weeks ago with pain involving the left anterior rib.  EXAM: CHEST  2 VIEW  COMPARISON:  Chest radiograph- 07/19/2013; 08/21/2010; left rib radiographic series-earlier same day  FINDINGS: The examination is degraded due to patient body habitus.  Grossly unchanged enlarged cardiac silhouette and mediastinal contours with atherosclerotic plaque within the thoracic aorta and prominence of the central pulmonary vasculature. Post median sternotomy and aortic valve replacement. There is pleural parenchymal thickening about right minor and bilateral major fissures. Mild pulmonary venous congestion without frank evidence of edema. Grossly unchanged bilateral infrahilar heterogeneous opacities favored to represent atelectasis. No new focal airspace opacities. There is persistent mild eventration the right hemidiaphragm. No pleural effusion or pneumothorax. Grossly unchanged bones.  IMPRESSION: 1. Pulmonary venous congestion without frank evidence of edema. 2. Similar findings of cardiomegaly and suspected pulmonary arterial hypertension. Post aortic valve replacement.   Electronically Signed   By: Sandi Mariscal M.D.   On: 05/16/2014 21:22   Dg Ribs Unilateral Left  05/16/2014   CLINICAL DATA:  Left lateral rib pain after fall 3 weeks prior.  EXAM: LEFT RIBS - 2 VIEW  COMPARISON:  None.  FINDINGS: Mild angulation of the anterior left fifth and sixth ribs without discrete acute fracture line. There is no evidence of left hemothorax or pneumothorax.  IMPRESSION: Age-indeterminate nondisplaced fractures of the anterior left fifth and sixth ribs.   Electronically Signed   By: Jorje Guild M.D.   On: 05/16/2014 21:25   Dg Thoracic Spine W/swimmers  05/16/2014   CLINICAL DATA:  [place].  Back pain.  EXAM: LUMBAR SPINE - COMPLETE 4+ VIEW; THORACIC SPINE - 2 VIEW + SWIMMERS  COMPARISON:  CT scan 10/29/2013  FINDINGS: Lumbar spine:  Normal  alignment of the lumbar vertebral bodies. No acute fracture or bone lesion. Stable degenerative disc disease at L4-5. Stable lower lumbar facet disease without definite pars defects. The visualized bony pelvis is grossly intact.  Thoracic spine:  Normal alignment of the thoracic vertebral bodies. No definite acute fracture is identified. Moderate stable degenerative changes when compared to a prior lateral chest x-ray from 2014.  IMPRESSION: Normal alignment and no acute bony findings.   Electronically Signed   By: Kalman Jewels M.D.   On: 05/16/2014 18:55   Dg Lumbar Spine Complete  05/16/2014   CLINICAL DATA:  [place].  Back pain.  EXAM: LUMBAR SPINE - COMPLETE 4+ VIEW; THORACIC SPINE - 2 VIEW + SWIMMERS  COMPARISON:  CT scan 10/29/2013  FINDINGS: Lumbar spine:  Normal alignment of the lumbar vertebral bodies. No acute fracture or bone lesion. Stable degenerative disc disease at L4-5. Stable lower lumbar facet disease without definite pars defects. The visualized bony pelvis is grossly intact.  Thoracic spine:  Normal alignment of the thoracic vertebral bodies. No definite  acute fracture is identified. Moderate stable degenerative changes when compared to a prior lateral chest x-ray from 2014.  IMPRESSION: Normal alignment and no acute bony findings.   Electronically Signed   By: Kalman Jewels M.D.   On: 05/16/2014 18:55   Dg Sacrum/coccyx  04/28/2014   CLINICAL DATA:  History of trauma from a fall now complaining of severe low back pain.  EXAM: SACRUM AND COCCYX - 2+ VIEW  COMPARISON:  No priors.  FINDINGS: There is no evidence of fracture or other focal bone lesions  IMPRESSION: Negative.   Electronically Signed   By: Vinnie Langton M.D.   On: 04/28/2014 08:52   Ct Head Wo Contrast  05/16/2014   CLINICAL DATA:  [place], hit head.  Large contusion.  EXAM: CT HEAD WITHOUT CONTRAST  TECHNIQUE: Contiguous axial images were obtained from the base of the skull through the vertex without contrast.  COMPARISON:   04/25/2014.  FINDINGS: There is a moderate-sized RIGHT frontoparietal scalp hematoma. There is no skull fracture or intracranial hemorrhage. Mild cerebral and cerebellar atrophy. Basal ganglia calcification is stable. Hypoattenuation of white matter suggesting chronic microvascular ischemic change. Paranasal sinuses and mastoids are clear.  Compared with prior examination, there is no significant change except that the LEFT frontal scalp hematoma has resolved.  IMPRESSION: Moderate-sized RIGHT frontoparietal scalp hematoma. No skull fracture or intracranial hemorrhage.   Electronically Signed   By: Rolla Flatten M.D.   On: 05/16/2014 17:08   Ct Head Wo Contrast  04/25/2014   CLINICAL DATA:  Fall, bruising to left cheek  EXAM: CT HEAD WITHOUT CONTRAST  TECHNIQUE: Contiguous axial images were obtained from the base of the skull through the vertex without intravenous contrast.  COMPARISON:  12/31/2013  FINDINGS: No evidence of parenchymal hemorrhage or extra-axial fluid collection. No mass lesion, mass effect, or midline shift.  No CT evidence of acute infarction.  Subcortical white matter and periventricular small vessel ischemic changes. Intracranial atherosclerosis.  Mild global cortical atrophy.  No ventriculomegaly.  Extracranial hematoma overlying the left frontal bone (series 2/ image 16). Mild soft tissue swelling/hematoma overlying the lateral orbit/zygoma (series 2/image 5).  The visualized paranasal sinuses are essentially clear. The mastoid air cells are unopacified.  No evidence of calvarial fracture.  IMPRESSION: Extracranial hematoma overlying the left frontal bone and lateral orbit/zygoma.  No evidence of calvarial fracture.  No evidence of acute intracranial abnormality.   Electronically Signed   By: Julian Hy M.D.   On: 04/25/2014 19:43    Medications: Scheduled Meds: . citalopram  40 mg Oral QHS  . furosemide  40 mg Intravenous Q8H  . insulin aspart  0-9 Units Subcutaneous TID WC   . insulin aspart protamine- aspart  30 Units Subcutaneous BID WC  . loratadine  10 mg Oral q morning - 10a  . mupirocin ointment  1 application Topical BID  . sodium chloride  3 mL Intravenous Q12H  . sodium chloride  3 mL Intravenous Q12H  . warfarin  5 mg Oral Once per day on Sun Tue Thu Sat  . warfarin  7.5 mg Oral Q M,W,F-1800  . Warfarin - Pharmacist Dosing Inpatient   Does not apply q1800      LOS: 1 day   RAI,RIPUDEEP M.D. [facility] 05/17/2014, 1:03 PM Pager: [phone]  If 7PM-7AM, please contact night-coverage [email or web address] Password [email or web address]  **Disclaimer: This note was dictated with voice recognition software. Similar sounding words can inadvertently be transcribed and this note may contain transcription errors which  may not have been corrected upon publication of note.**

## 2014-05-18 DIAGNOSIS — J9600 Acute respiratory failure, unspecified whether with hypoxia or hypercapnia: Secondary | ICD-10-CM

## 2014-05-18 DIAGNOSIS — Z5189 Encounter for other specified aftercare: Secondary | ICD-10-CM

## 2014-05-18 LAB — BASIC METABOLIC PANEL
Anion gap: 15 (ref 5–15)
BUN: 43 mg/dL — AB (ref 6–23)
CO2: 27 meq/L (ref 19–32)
Calcium: 9.4 mg/dL (ref 8.4–10.5)
Chloride: 100 mEq/L (ref 96–112)
Creatinine, Ser: 1.65 mg/dL — ABNORMAL HIGH (ref 0.50–1.10)
GFR calc Af Amer: 36 mL/min — ABNORMAL LOW (ref >90)
GFR, EST NON AFRICAN AMERICAN: 31 mL/min — AB (ref >90)
GLUCOSE: 169 mg/dL — AB (ref 70–99)
POTASSIUM: 4 meq/L (ref 3.7–5.3)
Sodium: 142 mEq/L (ref 137–147)

## 2014-05-18 LAB — GLUCOSE, CAPILLARY
Glucose-Capillary: 178 mg/dL — ABNORMAL HIGH (ref 70–99)
Glucose-Capillary: 116 mg/dL — ABNORMAL HIGH (ref 70–99)
Glucose-Capillary: 121 mg/dL — ABNORMAL HIGH (ref 70–99)

## 2014-05-18 LAB — PROTIME-INR
INR: 2.43 — ABNORMAL HIGH (ref 0.00–1.49)
Prothrombin Time: 26.4 seconds — ABNORMAL HIGH (ref 11.6–15.2)

## 2014-05-18 MED ORDER — WARFARIN SODIUM 6 MG PO TABS
6.0000 mg | ORAL_TABLET | Freq: Once | ORAL | Status: DC
  Filled 2014-05-18: qty 1

## 2014-05-18 MED ORDER — CITALOPRAM HYDROBROMIDE 40 MG PO TABS: 20.0000 mg | ORAL_TABLET | Freq: Every day | ORAL | Status: DC

## 2014-05-18 MED ORDER — INSULIN ASPART PROT & ASPART (70-30 MIX) 100 UNIT/ML ~~LOC~~ SUSP: 20.0000 [IU] | Freq: Two times a day (BID) | SUBCUTANEOUS | Status: DC

## 2014-05-18 MED ORDER — FUROSEMIDE 80 MG PO TABS: 80.0000 mg | ORAL_TABLET | Freq: Every morning | ORAL | Status: DC

## 2014-05-18 MED ORDER — OXYCODONE HCL 5 MG PO TABS
5.0000 mg | ORAL_TABLET | ORAL | Status: DC | PRN
Start: 2014-05-18 — End: 2014-05-18

## 2014-05-18 MED ORDER — FUROSEMIDE 40 MG PO TABS
40.0000 mg | ORAL_TABLET | Freq: Every day | ORAL | Status: DC
  Filled 2014-05-18: qty 1

## 2014-05-18 MED ORDER — FUROSEMIDE 40 MG PO TABS: 40.0000 mg | ORAL_TABLET | Freq: Every day | ORAL | Status: DC

## 2014-05-18 MED ORDER — FUROSEMIDE 80 MG PO TABS
80.0000 mg | ORAL_TABLET | Freq: Every morning | ORAL | Status: DC
  Filled 2014-05-18: qty 1

## 2014-05-18 NOTE — Progress Notes (Signed)
Patient discharged home with family. Discharge instructions given and reviewed with patient and family member. Patient discharged home with oxygen.

## 2014-05-18 NOTE — Discharge Summary (Signed)
Physician Discharge Summary  Hailey Curry WUJ:[id number] DOB: [birth year] DOA: 05/16/2014  PCP: Purvis Kilts, MD  Admit date: 05/16/2014 Discharge date: 05/18/2014  Time spent: 35 minutes  Recommendations for Outpatient Follow-up:  1. Need b-met to follow renal function. 2. Need CBC to follow up hb. Recent frontoparietal scalp hematoma.  3. She will need EKG to follow QT interval.  4. Need INR follow up.   Discharge Diagnoses:    Acute respiratory failure with hypoxia   Fall, resultant with frontoparietal scalp hematoma.    Acute diastolic HF exacerbation.    Prolong QT.    S/P AVR (aortic valve replacement)   DM (diabetes mellitus)   Hypoxia   Chronic kidney disease   Discharge Condition: stable.   Diet recommendation: Heart Healthy.   Filed Weights   05/16/14 2256 05/17/14 2206  Weight: 119.704 kg (263 lb 14.4 oz) 117.981 kg (260 lb 1.6 oz)    History of present illness:  Pt is 68 yo female with HTN, CKD stage II - III, DM, presented to [facility] ED after an episode of fall earlier today while shopping, she says her knee gave out. She explains similar events in the past, but this time she hit her head and noted some blood. She currently denies any chest pain or shortness of breath and denies any episodes of it prior to the fall. She also denies abd or urinary concerns. She has seen Dr. [name] earlier in the clinic for evaluation of CKD and due to LE edema her Lasix was changed to 80 mg in AM and 40 mg in the PM.  In ED, pt was stable hemodynamically and scalp hematoma was repaired by ED doctor. However, pt noted to be hypoxic with oxygen on RA in 80's and TRH asked to admit for observation and evaluation.    Hospital Course:  Acute hypoxic respiratory failure - multifactorial, secondary to acute on chronic diastolic CHF, underlying obesity hypoventilation syndrome  - Patient reported that Dr. [name] had just increased her Lasix to $Remove'80mg'mLaVqXe$  AM, $Re'40mg'CQV$  Noon due to fluid  overload. She is to followup with him back in one week for further adjustment of Lasix. She will follow up for lab work on Friday to follow renal function.  -Patient received Lasix 40 mg IV q8 hours.  BNP 606, Chest x-ray positive for pulmonary congestion. If patient back to baseline, transition to oral Lasix per Dr Deterding's rec's.  -Patient wants to go home. Her weight has decrease from 119 to 117. She will be discharge on lasix 80 mg in am and 40 PM. She will follow up with Dr Deterding.  -She will be discharge on oxygen.   Frequent falls  - appears to be mechanical , Per patient leg gave away  - with scalp hematoma  - PTOT evaluation  -need repeat hb , patient on coumadin.   Acute on chronic renal disease, stage II  - cr increased to 1.6. Patient to follow up on Friday for lab work.   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   Acute on Chronic diastolic CHF  - last 2 D ECHO in May 2015 EF 95-62%, grade 1 diastolic dysfunction, continue IV Lasix   Morbid obesity  -Patient counseled on diet and weight control   Status post aortic valve replacement  - continue coumadin per pharmacy  Prolong QT; I have decrease celexa. Need repeat EKG. I have advised patient to resume celexa tomorrow.   Procedures: None  Consultations:  none  Discharge Exam: Filed Vitals:   05/18/14 1059  BP: 122/47  Pulse: 71  Temp: 97.6 F (36.4 C)  Resp: 20    General: no distress.  Cardiovascular: S 1, S 2 RRR Respiratory: CTA  Discharge Instructions You were cared for by a hospitalist during your hospital stay. If you have any questions about your discharge medications or the care you received while you were in the hospital after you are discharged, you can call the unit and asked to speak with the hospitalist on call if the  hospitalist that took care of you is not available. Once you are discharged, your primary care physician will handle any further medical issues. Please note that NO REFILLS for any discharge medications will be authorized once you are discharged, as it is imperative that you return to your primary care physician (or establish a relationship with a primary care physician if you do not have one) for your aftercare needs so that they can reassess your need for medications and monitor your lab values.  Discharge Instructions   Diet - low sodium heart healthy    Complete by:  As directed      Increase activity slowly    Complete by:  As directed             Medication List         acetaminophen 325 MG tablet  Commonly known as:  TYLENOL  Take 2 tablets (650 mg total) by mouth every 4 (four) hours as needed for headache.     citalopram 40 MG tablet  Commonly known as:  CELEXA  Take 1 tablet by mouth at bedtime.     cyanocobalamin 1000 MCG/ML injection  Commonly known as:  (VITAMIN B-12)  Inject 1,000 mcg into the muscle every 30 (thirty) days.     FIBER SELECT GUMMIES Chew  Chew 1 tablet by mouth 2 (two) times daily.     fluticasone 50 MCG/ACT nasal spray  Commonly known as:  FLONASE  Place 2 sprays into the nose daily as needed for allergies or rhinitis.     furosemide 80 MG tablet  Commonly known as:  LASIX  Take 1 tablet (80 mg total) by mouth every morning.     furosemide 40 MG tablet  Commonly known as:  LASIX  Take 1 tablet (40 mg total) by mouth daily at 3 pm.     insulin aspart protamine- aspart (70-30) 100 UNIT/ML injection  Commonly known as:  NOVOLOG MIX 70/30  Inject 0.2 mLs (20 Units total) into the skin 2 (two) times daily with a meal. Takes 45 units in the morning and 30 units in the evenings     loratadine 10 MG tablet  Commonly known as:  CLARITIN  Take 10 mg by mouth every morning.     mupirocin ointment 2 %  Commonly known as:  BACTROBAN  Apply 1  application topically 2 (two) times daily. To hand     oxyCODONE 5 MG immediate release tablet  Commonly known as:  Oxy IR/ROXICODONE  Take 1 tablet (5 mg total) by mouth every 4 (four) hours as needed for moderate pain.     warfarin 5 MG tablet  Commonly known as:  COUMADIN  Take 5-7.5 mg by mouth See admin instructions. 7.5mg  taken on Mondays,Wednesdasy, and Fridays. Takes 5mg  on all other  days       Allergies  Allergen Reactions  . Demerol Other (See Comments)    Blood pressure goes up  . Meperidine Other (See Comments)    BP fluctuations  . Naprosyn [Naproxen] Swelling    Fluid retention Legs swelling       Follow-up Information   Follow up with DETERDING,JAMES L, MD.   Specialty:  Nephrology   Contact information:   [place] [address] [phone]       Please follow up. (lab at 10;30 am. )       Follow up with Lorretta Harp, MD In 1 week.   Specialty:  Cardiology   Contact information:   [address] [place] [address] [phone]        The results of significant diagnostics from this hospitalization (including imaging, microbiology, ancillary and laboratory) are listed below for reference.    Significant Diagnostic Studies: Dg Chest 2 View  05/16/2014   CLINICAL DATA:  Post fall 3 weeks ago with pain involving the left anterior rib.  EXAM: CHEST  2 VIEW  COMPARISON:  Chest radiograph- 07/19/2013; 08/21/2010; left rib radiographic series-earlier same day  FINDINGS: The examination is degraded due to patient body habitus.  Grossly unchanged enlarged cardiac silhouette and mediastinal contours with atherosclerotic plaque within the thoracic aorta and prominence of the central pulmonary vasculature. Post median sternotomy and aortic valve replacement. There is pleural parenchymal thickening about right minor and bilateral major fissures. Mild pulmonary venous congestion without frank evidence of edema. Grossly unchanged  bilateral infrahilar heterogeneous opacities favored to represent atelectasis. No new focal airspace opacities. There is persistent mild eventration the right hemidiaphragm. No pleural effusion or pneumothorax. Grossly unchanged bones.  IMPRESSION: 1. Pulmonary venous congestion without frank evidence of edema. 2. Similar findings of cardiomegaly and suspected pulmonary arterial hypertension. Post aortic valve replacement.   Electronically Signed   By: Sandi Mariscal M.D.   On: 05/16/2014 21:22   Dg Ribs Unilateral Left  05/16/2014   CLINICAL DATA:  Left lateral rib pain after fall 3 weeks prior.  EXAM: LEFT RIBS - 2 VIEW  COMPARISON:  None.  FINDINGS: Mild angulation of the anterior left fifth and sixth ribs without discrete acute fracture line. There is no evidence of left hemothorax or pneumothorax.  IMPRESSION: Age-indeterminate nondisplaced fractures of the anterior left fifth and sixth ribs.   Electronically Signed   By: Jorje Guild M.D.   On: 05/16/2014 21:25   Dg Thoracic Spine W/swimmers  05/16/2014   CLINICAL DATA:  [place].  Back pain.  EXAM: LUMBAR SPINE - COMPLETE 4+ VIEW; THORACIC SPINE - 2 VIEW + SWIMMERS  COMPARISON:  CT scan 10/29/2013  FINDINGS: Lumbar spine:  Normal alignment of the lumbar vertebral bodies. No acute fracture or bone lesion. Stable degenerative disc disease at L4-5. Stable lower lumbar facet disease without definite pars defects. The visualized bony pelvis is grossly intact.  Thoracic spine:  Normal alignment of the thoracic vertebral bodies. No definite acute fracture is identified. Moderate stable degenerative changes when compared to a prior lateral chest x-ray from 2014.  IMPRESSION: Normal alignment and no acute bony findings.   Electronically Signed   By: Kalman Jewels M.D.   On: 05/16/2014 18:55   Dg Lumbar Spine Complete  05/16/2014   CLINICAL DATA:  [place].  Back pain.  EXAM: LUMBAR SPINE - COMPLETE 4+ VIEW; THORACIC SPINE - 2 VIEW + SWIMMERS  COMPARISON:  CT scan  10/29/2013  FINDINGS: Lumbar spine:  Normal alignment of the lumbar vertebral bodies. No acute fracture or bone lesion. Stable degenerative disc disease at L4-5. Stable lower lumbar facet disease without definite pars defects. The visualized bony pelvis is grossly intact.  Thoracic spine:  Normal alignment of the thoracic vertebral bodies. No definite acute fracture is identified. Moderate stable degenerative changes when compared to a prior lateral chest x-ray from 2014.  IMPRESSION: Normal alignment and no acute bony findings.   Electronically Signed   By: Kalman Jewels M.D.   On: 05/16/2014 18:55   Dg Sacrum/coccyx  04/28/2014   CLINICAL DATA:  History of trauma from a fall now complaining of severe low back pain.  EXAM: SACRUM AND COCCYX - 2+ VIEW  COMPARISON:  No priors.  FINDINGS: There is no evidence of fracture or other focal bone lesions  IMPRESSION: Negative.   Electronically Signed   By: Vinnie Langton M.D.   On: 04/28/2014 08:52   Ct Head Wo Contrast  05/16/2014   CLINICAL DATA:  [place], hit head.  Large contusion.  EXAM: CT HEAD WITHOUT CONTRAST  TECHNIQUE: Contiguous axial images were obtained from the base of the skull through the vertex without contrast.  COMPARISON:  04/25/2014.  FINDINGS: There is a moderate-sized RIGHT frontoparietal scalp hematoma. There is no skull fracture or intracranial hemorrhage. Mild cerebral and cerebellar atrophy. Basal ganglia calcification is stable. Hypoattenuation of white matter suggesting chronic microvascular ischemic change. Paranasal sinuses and mastoids are clear.  Compared with prior examination, there is no significant change except that the LEFT frontal scalp hematoma has resolved.  IMPRESSION: Moderate-sized RIGHT frontoparietal scalp hematoma. No skull fracture or intracranial hemorrhage.   Electronically Signed   By: Rolla Flatten M.D.   On: 05/16/2014 17:08   Ct Head Wo Contrast  04/25/2014   CLINICAL DATA:  Fall, bruising to left cheek  EXAM:  CT HEAD WITHOUT CONTRAST  TECHNIQUE: Contiguous axial images were obtained from the base of the skull through the vertex without intravenous contrast.  COMPARISON:  12/31/2013  FINDINGS: No evidence of parenchymal hemorrhage or extra-axial fluid collection. No mass lesion, mass effect, or midline shift.  No CT evidence of acute infarction.  Subcortical white matter and periventricular small vessel ischemic changes. Intracranial atherosclerosis.  Mild global cortical atrophy.  No ventriculomegaly.  Extracranial hematoma overlying the left frontal bone (series 2/ image 16). Mild soft tissue swelling/hematoma overlying the lateral orbit/zygoma (series 2/image 5).  The visualized paranasal sinuses are essentially clear. The mastoid air cells are unopacified.  No evidence of calvarial fracture.  IMPRESSION: Extracranial hematoma overlying the left frontal bone and lateral orbit/zygoma.  No evidence of calvarial fracture.  No evidence of acute intracranial abnormality.   Electronically Signed   By: Julian Hy M.D.   On: 04/25/2014 19:43    Microbiology: No results found for this or any previous visit (from the past 240 hour(s)).   Labs: Basic Metabolic Panel:  Recent Labs Lab 05/16/14 1645 05/17/14 0558 05/18/14 0500  NA 142 143 142  K 4.4 4.9 4.0  CL 101 103 100  CO2 $Re'28 24 27  'koV$ GLUCOSE 130* 164* 169*  BUN 42* 42* 43*  CREATININE 1.50* 1.58* 1.65*  CALCIUM 9.7 9.5 9.4   Liver Function Tests:  Recent Labs Lab 05/16/14 1645  AST 14  ALT 9  ALKPHOS 88  BILITOT 1.5*  PROT 7.1  ALBUMIN 3.9   No results found for this basename: LIPASE, AMYLASE,  in the last 168 hours No  results found for this basename: AMMONIA,  in the last 168 hours CBC:  Recent Labs Lab 05/16/14 1645 05/17/14 0558  WBC 8.2 6.7  NEUTROABS 4.7  --   HGB 11.4* 11.1*  HCT 35.2* 35.0*  MCV 81.5 82.2  PLT 115* 115*   Cardiac Enzymes: No results found for this basename: CKTOTAL, CKMB, CKMBINDEX, TROPONINI,  in  the last 168 hours BNP: BNP (last 3 results)  Recent Labs  05/17/14 0040  PROBNP 606.5*   CBG:  Recent Labs Lab 05/17/14 1156 05/17/14 1640 05/17/14 2205 05/18/14 0757 05/18/14 1130  GLUCAP 80 145* 165* 178* 116*       Signed:  Yassine Brunsman A  [facility] 05/18/2014, 2:14 PM

## 2014-05-18 NOTE — Progress Notes (Signed)
Physical Therapy Treatment Note  SATURATION QUALIFICATIONS: (This note is used to comply with regulatory documentation for home oxygen)  Patient Saturations on Room Air at Rest = 89-90%  Patient Saturations on Room Air while Ambulating = 84%  Patient Saturations on 1-2 Liters of oxygen while Ambulating = 90-92%  Please briefly explain why patient needs home oxygen: Patient requires supplemental oxygen to maintain oxygen saturation at safe, acceptable levels during activity.   See also other PT note of this date for more details of PT session;  Thanks,   Roney Marion, [place]  Acute Rehabilitation Services Pager [phone] Office [phone]

## 2014-05-18 NOTE — Progress Notes (Signed)
Physical Therapy Treatment Patient Details Name: Fatuma Dowers MRN: [id number] DOB: 09-20-1946 Today's Date: 05/18/2014    History of Present Illness Pt is 68 yo female with HTN, CKD stage II - III, DM, presented to [facility] ED after an episode of fall earlier today while shopping, she says her knee gave out. She explains similar events in the past, but this time she hit her head and noted some blood. She currently denies any chest pain or shortness of breath and denies any episodes of it prior to the fall. Scalp lac repaired in ED, noted pt hypoxic with sats on Room Air in the 80s    PT Comments    Good progress with mobility and activity tolerance; Still, Patient requires supplemental oxygen to maintain oxygen saturation at safe, acceptable levels during activity, though less than yesterday  See other PT note of this date for Oxygen qualification walk. OK for dc home from PT standpoint ; continue to recommend HHPT, RN to aid in weaning from supplemental , O2, and address her history of falls in the home.   Follow Up Recommendations  Home health PT;Supervision/Assistance - 24 hour [facility] for chrinic disease management)     Equipment Recommendations  None recommended by PT    Recommendations for Other Services       Precautions / Restrictions Precautions Precaution Comments: watch O2 sats    Mobility  Bed Mobility                  Transfers   Equipment used: Rolling walker (2 wheeled) Transfers: Sit to/from Stand Sit to Stand: Modified independent (Device/Increase time)            Ambulation/Gait Ambulation/Gait assistance: Supervision Ambulation Distance (Feet): 240 Feet Assistive device: Rolling walker (2 wheeled) Gait Pattern/deviations: Step-through pattern   Gait velocity interpretation: at or above normal speed for age/gender General Gait Details: Cues this session mostly to self-monitor for activity tolerance; good use of RW   Stairs             Wheelchair Mobility    Modified Rankin (Stroke Patients Only)       Balance Overall balance assessment: History of Falls                                  Cognition Arousal/Alertness: Awake/alert Behavior During Therapy: WFL for tasks assessed/performed Overall Cognitive Status: Within Functional Limits for tasks assessed                      Exercises      General Comments General comments (skin integrity, edema, etc.): Lengthy discussion re: self-monitor for activity tolerance, the need for deep breathing to decr risk of pneumonia, and splinting L ribs to help with deep breathing      Pertinent Vitals/Pain Pain Assessment: No/denies pain    Home Living                      Prior Function            PT Goals (current goals can now be found in the care plan section) Acute Rehab PT Goals Patient Stated Goal: home Progress towards PT goals: Progressing toward goals    Frequency  Min 3X/week    PT Plan Current plan remains appropriate    Co-evaluation             End of  Session Equipment Utilized During Treatment: Oxygen Activity Tolerance: Patient tolerated treatment well Patient left: in chair;with call bell/phone within reach;with family/visitor present     Time: 0100-7121 PT Time Calculation (min): 31 min  Charges:  $Gait Training: 23-37 mins                    G Codes:      Quin Hoop 05/18/2014, 10:56 AM  Roney Marion, [place] Pager [phone] Office [phone]

## 2014-05-18 NOTE — Progress Notes (Signed)
ANTICOAGULATION CONSULT NOTE -Follow up  Pharmacy Consult for coumadin  Indication: Mechanical AV  Allergies  Allergen Reactions  . Demerol Other (See Comments)    Blood pressure goes up  . Meperidine Other (See Comments)    BP fluctuations  . Naprosyn [Naproxen] Swelling    Fluid retention Legs swelling    Patient Measurements: Height: 5' 3.5" (161.3 cm) Weight: 260 lb 1.6 oz (117.981 kg) IBW/kg (Calculated) : 53.55 Heparin Dosing Weight:   Vital Signs: Temp: 97.6 F (36.4 C) (08/19 1059) Temp src: Oral (08/19 1059) BP: 122/47 mmHg (08/19 1059) Pulse Rate: 71 (08/19 1059)  Labs:  Recent Labs  05/16/14 1645 05/17/14 0558 05/18/14 0500  HGB 11.4* 11.1*  --   HCT 35.2* 35.0*  --   PLT 115* 115*  --   LABPROT 24.7* 22.3* 26.4*  INR 2.23* 1.96* 2.43*  CREATININE 1.50* 1.58* 1.65*    Estimated Creatinine Clearance: 40.3 ml/min (by C-G formula based on Cr of 1.65).   Medical History: Past Medical History  Diagnosis Date  . Diabetes mellitus   . Hypertension   . High cholesterol   . Breast cancer 11/2003  . Colonic adenoma   . OSA (obstructive sleep apnea)     AHI 45.0/hr, REM sleep was 231 mins.sleep was 77% and 84%, RDI was 21.5/hr total sleep was 2h 58 mins  . Hx of echocardiogram 04/28/2012    EF>55%, Moderate to severe mitral annular calcification, mild mitral regurgitation, mild mitral stenosis, mild tricuspid regurgitation, the prosthetic aortic valve is not well visualized, 21 mm [employer] michanical valve there is aortic root sclerosis/calcification. 20mmhg midcavity gradient.  . History of stress test 04/2008    Normal myocardial perfusion scan demonstrating an attenuationartifact in the anterior region of the myocardium. No ischemia or infarct/scar is seen in the remaining myocardium. compared to the previous study, there is no significant change. No significant ischemia demonstrated. this is a low risk study.  . Aortic stenosis   . H/O aortic  valve replacement   . Scalp laceration 12/21/2013  . Traumatic subarachnoid hemorrhage 12/21/2013  . Chronic anemia   . Chronic abdominal pain   . Renal disorder     Stage 3 kidney disease.    Medications:  Prescriptions prior to admission  Medication Sig Dispense Refill  . acetaminophen (TYLENOL) 325 MG tablet Take 2 tablets (650 mg total) by mouth every 4 (four) hours as needed for headache.      . citalopram (CELEXA) 40 MG tablet Take 1 tablet by mouth at bedtime.       . cyanocobalamin (,VITAMIN B-12,) 1000 MCG/ML injection Inject 1,000 mcg into the muscle every 30 (thirty) days.      Marland Kitchen FIBER SELECT GUMMIES CHEW Chew 1 tablet by mouth 2 (two) times daily.      . fluticasone (FLONASE) 50 MCG/ACT nasal spray Place 2 sprays into the nose daily as needed for allergies or rhinitis.       . furosemide (LASIX) 20 MG tablet Take 40 mg by mouth 2 (two) times daily.       . insulin aspart protamine- aspart (NOVOLOG MIX 70/30) (70-30) 100 UNIT/ML injection Inject 30-45 Units into the skin 2 (two) times daily with a meal. Takes 45 units in the morning and 30 units in the evenings      . loratadine (CLARITIN) 10 MG tablet Take 10 mg by mouth every morning.      . mupirocin ointment (BACTROBAN) 2 % Apply 1 application  topically 2 (two) times daily. To hand      . warfarin (COUMADIN) 5 MG tablet Take 5-7.5 mg by mouth See admin instructions. 7.5mg  taken on Mondays,Wednesdasy, and Fridays. Takes 5mg  on all other days        Assessment: 68 y.o female on coumadin PTA for mechanical AVR with subtherapeutic INR today of 2.43.  Admitted with frequent falls thought to be mechanical in nature. Acute hypoxic resp failure multifactorial, secondary to acute on chronic diastolic CHF, underlying obesity hypoventilation syndrome.  Acute on chronic renal disease due to DM2. Morbid obesity.  Hgb 11.1, stable. Pltc stable, 115K. No bleeding noted.  Home coumadin dose was 7.5 mg every MWF and 5 mg every TTSS. Her INR  has increased after 7.5 mg X 2.  Goal of Therapy:  INR 2.5-3.5 Monitor platelets by anticoagulation protocol: Yes   Plan:  Give coumadin 6mg  x1  today.   Daily INR  Thanks for allowing pharmacy to be a part of this patient's care.  Excell Seltzer, PharmD Clinical Pharmacist, [phone] 05/18/2014,11:16 AM

## 2014-05-18 NOTE — Care Management Note (Signed)
CARE MANAGEMENT NOTE 05/18/2014  Patient:  Hailey Curry, Hailey Curry   Account Number:  [id number]  Date Initiated:  05/17/2014  Documentation initiated by:  [facility]  Subjective/Objective Assessment:   hypoxia     Action/Plan:   Order for HHPT and home oxygen. [facility] notified and will deliver oxygen to the room for d/c. Pt politely declined HHPT, stating that she wishes to see her orthoped about her knee before she does more PT.   Anticipated DC Date:  05/18/2014   Anticipated DC Plan:  [place]  CM consult      Choice offered to / List presented to:     DME arranged  [facility]      DME agency  [facility].        Status of service:  In process, will continue to follow Medicare Important Message given?  NA - LOS <3 / Initial given by admissions (If response is "NO", the following Medicare IM given date fields will be blank) Date Medicare IM given:   Medicare IM given by:   Date Additional Medicare IM given:   Additional Medicare IM given by:    Discharge Disposition:  HOME/SELF CARE  Per UR Regulation:  Reviewed for med. necessity/level of care/duration of stay  If discussed at [place] of Stay Meetings, dates discussed:    Comments:

## 2014-05-19 ENCOUNTER — Ambulatory Visit (INDEPENDENT_AMBULATORY_CARE_PROVIDER_SITE_OTHER): Payer: Medicare Other | Admitting: *Deleted

## 2014-05-19 DIAGNOSIS — Z954 Presence of other heart-valve replacement: Secondary | ICD-10-CM

## 2014-05-19 DIAGNOSIS — Z952 Presence of prosthetic heart valve: Secondary | ICD-10-CM

## 2014-05-19 DIAGNOSIS — Z7901 Long term (current) use of anticoagulants: Secondary | ICD-10-CM

## 2014-05-19 LAB — POCT INR: INR: 3.2

## 2014-05-23 ENCOUNTER — Telehealth: Payer: Self-pay | Admitting: Adult Health

## 2014-05-23 ENCOUNTER — Telehealth: Payer: Self-pay | Admitting: Cardiovascular Disease

## 2014-05-23 NOTE — Telephone Encounter (Signed)
Pt was admitted last Monday and discharged on Wednesday. They told her to follow up with Dr Gwenlyn Found to see what he thought.

## 2014-05-23 NOTE — Telephone Encounter (Signed)
Has spot on buttock she wants checked, has fallen recently and has fractured ribs, to come in 8/26 at 4 pm

## 2014-05-25 ENCOUNTER — Ambulatory Visit (INDEPENDENT_AMBULATORY_CARE_PROVIDER_SITE_OTHER): Payer: Medicare Other | Admitting: Adult Health

## 2014-05-25 ENCOUNTER — Encounter: Payer: Self-pay | Admitting: Adult Health

## 2014-05-25 VITALS — BP 140/60 | Ht 63.5 in | Wt 259.0 lb

## 2014-05-25 DIAGNOSIS — I8390 Asymptomatic varicose veins of unspecified lower extremity: Secondary | ICD-10-CM | POA: Insufficient documentation

## 2014-05-25 MED ORDER — HYDROCORTISONE ACE-PRAMOXINE 1-1 % RE FOAM: 1.0000 | Freq: Two times a day (BID) | RECTAL | Status: DC

## 2014-05-25 NOTE — Patient Instructions (Signed)
Use proctofoam 2-3 x daily Use ice Follow up prn

## 2014-05-25 NOTE — Progress Notes (Signed)
Subjective:     Patient ID: Hailey Curry, female   DOB: Feb 20, 1946, 68 y.o.   MRN: [id number]  HPI Hailey Curry is a 68 year old white female, married, in complaining of bleeding from spot on buttock.No pain, irritated.She is on coumadin.  Review of Systems See HPI Reviewed past medical,surgical, social and family history. Reviewed medications and allergies.     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

## 2014-05-26 ENCOUNTER — Telehealth: Payer: Self-pay | Admitting: Adult Health

## 2014-05-26 ENCOUNTER — Ambulatory Visit: Payer: Medicare Other | Admitting: Cardiology

## 2014-05-26 NOTE — Telephone Encounter (Signed)
Hailey Curry can't pay 145 dollars for proctofoam will rx Hemorrhoid cream that apothecary compounds it is only $ 45

## 2014-05-31 ENCOUNTER — Encounter: Payer: Self-pay | Admitting: Cardiology

## 2014-05-31 ENCOUNTER — Ambulatory Visit (INDEPENDENT_AMBULATORY_CARE_PROVIDER_SITE_OTHER): Payer: Medicare Other | Admitting: Cardiology

## 2014-05-31 ENCOUNTER — Ambulatory Visit: Payer: Medicare Other | Admitting: Cardiology

## 2014-05-31 VITALS — BP 149/73 | HR 81 | Ht 63.5 in | Wt 255.4 lb

## 2014-05-31 DIAGNOSIS — Z952 Presence of prosthetic heart valve: Secondary | ICD-10-CM

## 2014-05-31 DIAGNOSIS — E669 Obesity, unspecified: Secondary | ICD-10-CM

## 2014-05-31 DIAGNOSIS — I5031 Acute diastolic (congestive) heart failure: Secondary | ICD-10-CM

## 2014-05-31 DIAGNOSIS — I509 Heart failure, unspecified: Secondary | ICD-10-CM

## 2014-05-31 DIAGNOSIS — G4730 Sleep apnea, unspecified: Secondary | ICD-10-CM

## 2014-05-31 DIAGNOSIS — Z954 Presence of other heart-valve replacement: Secondary | ICD-10-CM

## 2014-05-31 DIAGNOSIS — R0902 Hypoxemia: Secondary | ICD-10-CM

## 2014-05-31 DIAGNOSIS — N183 Chronic kidney disease, stage 3 unspecified: Secondary | ICD-10-CM

## 2014-05-31 DIAGNOSIS — Z7901 Long term (current) use of anticoagulants: Secondary | ICD-10-CM

## 2014-05-31 NOTE — Assessment & Plan Note (Signed)
She can't wear her current mask and is in the process of re evaluation

## 2014-05-31 NOTE — Assessment & Plan Note (Signed)
BMI 44 

## 2014-05-31 NOTE — Assessment & Plan Note (Signed)
Diuretics increased 05/18/14

## 2014-05-31 NOTE — Assessment & Plan Note (Signed)
She now has O2 at home but hasn't required it

## 2014-05-31 NOTE — Assessment & Plan Note (Signed)
She has had several fall- she was found to have Fx ribs when she was recently seen for a fall in ER

## 2014-05-31 NOTE — Assessment & Plan Note (Signed)
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

## 2014-05-31 NOTE — Assessment & Plan Note (Signed)
Coumadin 

## 2014-05-31 NOTE — Assessment & Plan Note (Signed)
Stage 3- seeing Dr [name]

## 2014-05-31 NOTE — Patient Instructions (Signed)
Kerin Ransom, PA-C has made no changes in your current medications or therapy.  Your physician recommends that you schedule a follow-up appointment in 3 months with Dr Gwenlyn Found.

## 2014-05-31 NOTE — Assessment & Plan Note (Signed)
Fall March 2015

## 2014-05-31 NOTE — Progress Notes (Signed)
05/31/2014 Hailey Curry   Oct 21, 1945  [id number]  Primary Physicia Purvis Kilts, MD Primary Cardiologist: Dr Gwenlyn Found  HPI:  68 year-old obese female, followed by Dr. Gwenlyn Found, with a St Jude AVR on chronic Coumadin therapy, HTN, and diabetes who was admitted on 12/20/13 to 12/22/13 for a [place] after a fall. The wound was closed and patient was given vitamin K and FFP to reverse anticoagulation. She was kept off Coumadin x one month.  She has had anemia and has been evaluated by [name], MD at [facility]. Dr. Tressie Stalker felt the anemia is multifactorial, including iron deficiency, hemolysis from the St. Jude Aortic Valve, and possible malignancy. The patient has a history of breast cancer s/p lumpectomy and radiation in 2005. She has had a few more falls since resuming Coumadin. She was seen in the ER 05/16/14 after her Lt knee "gave out". She was noted to be hypoxic. CXR revealed of Lt rib fractures- the pt thinks it was from a few weeks earlier. She was admitted for hypoxia and felt to be in diastolic CHF. Her diuretics were adjusted and she has followed up with Dr [name]. She is doing better, less edema, no falls.     Current Outpatient Prescriptions  Medication Sig Dispense Refill  . acetaminophen (TYLENOL) 325 MG tablet Take 2 tablets (650 mg total) by mouth every 4 (four) hours as needed for headache.      . citalopram (CELEXA) 40 MG tablet Take 0.5 tablets (20 mg total) by mouth at bedtime.  30 tablet  0  . cyanocobalamin (,VITAMIN B-12,) 1000 MCG/ML injection Inject 1,000 mcg into the muscle every 30 (thirty) days.      Mariane Baumgarten Sodium (COLACE PO) Take by mouth as needed.      Marland Kitchen FIBER SELECT GUMMIES CHEW Chew 1 tablet by mouth 2 (two) times daily.      . fluticasone (FLONASE) 50 MCG/ACT nasal spray Place 2 sprays into the nose daily as needed for allergies or rhinitis.       . furosemide (LASIX) 40 MG tablet Take 1 tablet (40 mg total) by mouth daily at 3 pm.  30 tablet  0   . furosemide (LASIX) 80 MG tablet Take 1 tablet (80 mg total) by mouth every morning.  30 tablet  0  . insulin aspart protamine- aspart (NOVOLOG MIX 70/30) (70-30) 100 UNIT/ML injection Inject 20 Units into the skin 2 (two) times daily with a meal. Takes 30 units in the morning and 30 units in the evenings      . loratadine (CLARITIN) 10 MG tablet Take 10 mg by mouth every morning.      Marland Kitchen oxyCODONE-acetaminophen (PERCOCET/ROXICET) 5-325 MG per tablet Take 1 tablet by mouth as needed.      . warfarin (COUMADIN) 5 MG tablet Take 5-7.5 mg by mouth See admin instructions. 7.5mg  taken on Mondays,Wednesdasy, and Fridays. Takes 5mg  on all other days       No current facility-administered medications for this visit.    Allergies  Allergen Reactions  . Demerol Other (See Comments)    Blood pressure goes up  . Meperidine Other (See Comments)    BP fluctuations  . Naprosyn [Naproxen] Swelling    Fluid retention Legs swelling    History   Social History  . Marital Status: Married    Spouse Name: N/A    Number of Children: N/A  . Years of Education: N/A   Occupational History  . Not on  file.   Social History Main Topics  . Smoking status: Never Smoker   . Smokeless tobacco: Never Used  . Alcohol Use: No  . Drug Use: No  . Sexual Activity: Not Currently    Birth Control/ Protection: Post-menopausal   Other Topics Concern  . Not on file   Social History Narrative  . No narrative on file     Review of Systems: General: negative for chills, fever, night sweats or weight changes.  Cardiovascular: negative for chest pain, dyspnea on exertion, edema, orthopnea, palpitations, paroxysmal nocturnal dyspnea or shortness of breath Dermatological: negative for rash Respiratory: negative for cough or wheezing Urologic: negative for hematuria Abdominal: negative for nausea, vomiting, diarrhea, bright red blood per rectum, melena, or hematemesis Neurologic: negative for visual changes,  syncope, or dizziness All other systems reviewed and are otherwise negative except as noted above.    Blood pressure 149/73, pulse 81, height 5' 3.5" (1.613 m), weight 255 lb 6.4 oz (115.849 kg).  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 It seems unlikely that this has significantly changed so I did not repeat it. He fall sound mechanical as opposed to true syncope. If she continues to have episodes we may want to do an event monitor. She'll follow up with Dr Gwenlyn Found in 3 months. She'll need a follow up echo at some point (mod MS).    Deni Lefever KPA-C 05/31/2014 5:12 PM

## 2014-06-08 ENCOUNTER — Ambulatory Visit (INDEPENDENT_AMBULATORY_CARE_PROVIDER_SITE_OTHER): Payer: Medicare Other | Admitting: *Deleted

## 2014-06-08 DIAGNOSIS — Z954 Presence of other heart-valve replacement: Secondary | ICD-10-CM

## 2014-06-08 DIAGNOSIS — Z952 Presence of prosthetic heart valve: Secondary | ICD-10-CM

## 2014-06-08 DIAGNOSIS — Z7901 Long term (current) use of anticoagulants: Secondary | ICD-10-CM

## 2014-06-08 LAB — POCT INR: INR: 3.9

## 2014-07-06 ENCOUNTER — Ambulatory Visit (INDEPENDENT_AMBULATORY_CARE_PROVIDER_SITE_OTHER): Payer: Medicare Other | Admitting: *Deleted

## 2014-07-06 DIAGNOSIS — Z7901 Long term (current) use of anticoagulants: Secondary | ICD-10-CM

## 2014-07-06 DIAGNOSIS — Z954 Presence of other heart-valve replacement: Secondary | ICD-10-CM

## 2014-07-06 DIAGNOSIS — Z952 Presence of prosthetic heart valve: Secondary | ICD-10-CM

## 2014-07-06 LAB — POCT INR: INR: 3.9

## 2014-07-14 ENCOUNTER — Other Ambulatory Visit (HOSPITAL_COMMUNITY): Payer: Self-pay | Admitting: Family Medicine

## 2014-07-14 DIAGNOSIS — Z1231 Encounter for screening mammogram for malignant neoplasm of breast: Secondary | ICD-10-CM

## 2014-07-15 ENCOUNTER — Other Ambulatory Visit: Payer: Self-pay

## 2014-07-27 ENCOUNTER — Ambulatory Visit (HOSPITAL_COMMUNITY): Payer: Medicare Other

## 2014-07-27 ENCOUNTER — Encounter: Payer: Self-pay | Admitting: *Deleted

## 2014-08-03 ENCOUNTER — Ambulatory Visit (INDEPENDENT_AMBULATORY_CARE_PROVIDER_SITE_OTHER): Payer: Medicare Other | Admitting: *Deleted

## 2014-08-03 DIAGNOSIS — Z7901 Long term (current) use of anticoagulants: Secondary | ICD-10-CM

## 2014-08-03 DIAGNOSIS — Z954 Presence of other heart-valve replacement: Secondary | ICD-10-CM

## 2014-08-03 DIAGNOSIS — Z952 Presence of prosthetic heart valve: Secondary | ICD-10-CM

## 2014-08-03 LAB — POCT INR: INR: 3.9

## 2014-08-04 ENCOUNTER — Ambulatory Visit (HOSPITAL_COMMUNITY)
Admission: RE | Admit: 2014-08-04 | Discharge: 2014-08-04 | Disposition: A | Discharge status: Home / Self Care | Payer: Medicare Other | Source: Ambulatory Visit | Attending: Family Medicine | Admitting: Family Medicine

## 2014-08-04 DIAGNOSIS — Z1231 Encounter for screening mammogram for malignant neoplasm of breast: Secondary | ICD-10-CM

## 2014-08-11 ENCOUNTER — Telehealth: Payer: Self-pay | Admitting: Cardiovascular Disease

## 2014-08-11 NOTE — Telephone Encounter (Signed)
New message      Need to confirm or deny diagnosis of CHF----pt want to participate in the CHF clinic

## 2014-08-11 NOTE — Telephone Encounter (Signed)
Talk to JB

## 2014-08-11 NOTE — Telephone Encounter (Signed)
Called patient to see if OK to speak with [facility] representative. She states is OK - but she was not aware of a chronic HF issue. She states in August she was in hospital r/t difficulty breathing (hypoxia) but was found to have cracked ribs. She has been involved with a [employer] heart failure program and states they contacted her to send her a scale, which she weighs daily with (also does this r/t nephrology issues). She would like clarification on this diagnosis and if Dr. Gwenlyn Found feels it necessary for her to partake or continue with a heart failure program, she will do so.   Will forward to Dr. Gwenlyn Found & Curt Bears to review & advise

## 2014-08-12 NOTE — Telephone Encounter (Signed)
She has diastolic heart failure with echo that shows grade 1 diastolic dysfunction

## 2014-08-15 NOTE — Telephone Encounter (Signed)
I spoke with patient and reviewed the information with patient about diastolic heart failure

## 2014-08-18 ENCOUNTER — Telehealth: Payer: Self-pay | Admitting: Cardiovascular Disease

## 2014-08-18 ENCOUNTER — Ambulatory Visit: Payer: Medicare Other | Admitting: Cardiovascular Disease

## 2014-08-18 NOTE — Telephone Encounter (Signed)
Received a call from [place] with [facility] [employer].She stated patient is enrolled in their CHF program and she needed to know if patient had a diagnosis of CHF and her last EF.Patient does have Diastolic CHF.Last EF by echo 02/16/14 65 to 70 %.

## 2014-08-22 ENCOUNTER — Other Ambulatory Visit: Payer: Self-pay | Admitting: Pharmacist Clinician (PhC)/ Clinical Pharmacy Specialist

## 2014-08-24 ENCOUNTER — Ambulatory Visit (INDEPENDENT_AMBULATORY_CARE_PROVIDER_SITE_OTHER): Payer: Medicare Other | Admitting: *Deleted

## 2014-08-24 DIAGNOSIS — Z7901 Long term (current) use of anticoagulants: Secondary | ICD-10-CM

## 2014-08-24 DIAGNOSIS — Z954 Presence of other heart-valve replacement: Secondary | ICD-10-CM

## 2014-08-24 DIAGNOSIS — Z952 Presence of prosthetic heart valve: Secondary | ICD-10-CM

## 2014-08-24 LAB — POCT INR: INR: 3.2

## 2014-08-30 ENCOUNTER — Telehealth: Payer: Self-pay | Admitting: Cardiovascular Disease

## 2014-08-30 ENCOUNTER — Ambulatory Visit: Payer: Medicare Other | Admitting: Cardiovascular Disease

## 2014-08-31 NOTE — Telephone Encounter (Signed)
Closed encounter °

## 2014-09-05 ENCOUNTER — Telehealth: Payer: Self-pay | Admitting: Adult Health

## 2014-09-05 ENCOUNTER — Other Ambulatory Visit: Payer: Self-pay | Admitting: Adult Health

## 2014-09-05 NOTE — Telephone Encounter (Signed)
Refilled meds

## 2014-09-07 ENCOUNTER — Encounter: Payer: Self-pay | Admitting: Cardiovascular Disease

## 2014-09-19 ENCOUNTER — Other Ambulatory Visit (HOSPITAL_COMMUNITY): Payer: Self-pay | Admitting: Family Medicine

## 2014-09-19 ENCOUNTER — Other Ambulatory Visit (HOSPITAL_COMMUNITY): Payer: Self-pay | Admitting: Physician Assistant

## 2014-09-19 DIAGNOSIS — E559 Vitamin D deficiency, unspecified: Secondary | ICD-10-CM

## 2014-09-21 ENCOUNTER — Ambulatory Visit (INDEPENDENT_AMBULATORY_CARE_PROVIDER_SITE_OTHER): Payer: Medicare Other | Admitting: *Deleted

## 2014-09-21 DIAGNOSIS — Z952 Presence of prosthetic heart valve: Secondary | ICD-10-CM

## 2014-09-21 DIAGNOSIS — Z954 Presence of other heart-valve replacement: Secondary | ICD-10-CM

## 2014-09-21 DIAGNOSIS — Z7901 Long term (current) use of anticoagulants: Secondary | ICD-10-CM

## 2014-09-21 LAB — POCT INR: INR: 3.7

## 2014-09-26 ENCOUNTER — Other Ambulatory Visit (HOSPITAL_COMMUNITY): Payer: Medicare Other

## 2014-09-27 ENCOUNTER — Encounter: Payer: Self-pay | Admitting: Cardiovascular Disease

## 2014-09-28 ENCOUNTER — Other Ambulatory Visit (HOSPITAL_COMMUNITY): Payer: Medicare Other

## 2014-10-03 DIAGNOSIS — T82897A Other specified complication of cardiac prosthetic devices, implants and grafts, initial encounter: Secondary | ICD-10-CM | POA: Diagnosis not present

## 2014-10-03 DIAGNOSIS — D509 Iron deficiency anemia, unspecified: Secondary | ICD-10-CM | POA: Diagnosis not present

## 2014-10-03 DIAGNOSIS — D594 Other nonautoimmune hemolytic anemias: Secondary | ICD-10-CM | POA: Diagnosis not present

## 2014-10-03 DIAGNOSIS — C50219 Malignant neoplasm of upper-inner quadrant of unspecified female breast: Secondary | ICD-10-CM | POA: Diagnosis not present

## 2014-10-03 DIAGNOSIS — D696 Thrombocytopenia, unspecified: Secondary | ICD-10-CM | POA: Diagnosis not present

## 2014-10-10 ENCOUNTER — Ambulatory Visit: Payer: Medicare Other | Admitting: Cardiovascular Disease

## 2014-10-13 DIAGNOSIS — Z6841 Body Mass Index (BMI) 40.0 and over, adult: Secondary | ICD-10-CM | POA: Diagnosis not present

## 2014-10-13 DIAGNOSIS — S8392XA Sprain of unspecified site of left knee, initial encounter: Secondary | ICD-10-CM | POA: Diagnosis not present

## 2014-10-19 ENCOUNTER — Ambulatory Visit (INDEPENDENT_AMBULATORY_CARE_PROVIDER_SITE_OTHER): Payer: Medicare Other | Admitting: *Deleted

## 2014-10-19 DIAGNOSIS — Z954 Presence of other heart-valve replacement: Secondary | ICD-10-CM

## 2014-10-19 DIAGNOSIS — Z952 Presence of prosthetic heart valve: Secondary | ICD-10-CM

## 2014-10-19 DIAGNOSIS — Z7901 Long term (current) use of anticoagulants: Secondary | ICD-10-CM | POA: Diagnosis not present

## 2014-10-19 LAB — POCT INR: INR: 2.1

## 2014-11-01 DIAGNOSIS — E782 Mixed hyperlipidemia: Secondary | ICD-10-CM | POA: Diagnosis not present

## 2014-11-01 DIAGNOSIS — I129 Hypertensive chronic kidney disease with stage 1 through stage 4 chronic kidney disease, or unspecified chronic kidney disease: Secondary | ICD-10-CM | POA: Diagnosis not present

## 2014-11-01 DIAGNOSIS — N183 Chronic kidney disease, stage 3 (moderate): Secondary | ICD-10-CM | POA: Diagnosis not present

## 2014-11-01 DIAGNOSIS — N2581 Secondary hyperparathyroidism of renal origin: Secondary | ICD-10-CM | POA: Diagnosis not present

## 2014-11-01 DIAGNOSIS — E1129 Type 2 diabetes mellitus with other diabetic kidney complication: Secondary | ICD-10-CM | POA: Diagnosis not present

## 2014-11-07 ENCOUNTER — Telehealth: Payer: Self-pay | Admitting: Cardiovascular Disease

## 2014-11-07 NOTE — Telephone Encounter (Signed)
Received records from [facility] for appointment with Dr Gwenlyn Found on 11/11/14.  Records given to [facility] (medical records) for Dr Kennon Holter schedule on 11/11/14.  lp

## 2014-11-09 ENCOUNTER — Ambulatory Visit (INDEPENDENT_AMBULATORY_CARE_PROVIDER_SITE_OTHER): Payer: Medicare Other | Admitting: *Deleted

## 2014-11-09 DIAGNOSIS — Z952 Presence of prosthetic heart valve: Secondary | ICD-10-CM

## 2014-11-09 DIAGNOSIS — Z954 Presence of other heart-valve replacement: Secondary | ICD-10-CM | POA: Diagnosis not present

## 2014-11-09 DIAGNOSIS — Z7901 Long term (current) use of anticoagulants: Secondary | ICD-10-CM | POA: Diagnosis not present

## 2014-11-09 LAB — POCT INR: INR: 2.7

## 2014-11-11 ENCOUNTER — Encounter: Payer: Self-pay | Admitting: Cardiovascular Disease

## 2014-11-11 ENCOUNTER — Ambulatory Visit (INDEPENDENT_AMBULATORY_CARE_PROVIDER_SITE_OTHER): Payer: Medicare Other | Admitting: Cardiovascular Disease

## 2014-11-11 VITALS — BP 140/62 | HR 84 | Ht 63.5 in | Wt 260.4 lb

## 2014-11-11 DIAGNOSIS — I10 Essential (primary) hypertension: Secondary | ICD-10-CM | POA: Diagnosis not present

## 2014-11-11 DIAGNOSIS — I5031 Acute diastolic (congestive) heart failure: Secondary | ICD-10-CM | POA: Diagnosis not present

## 2014-11-11 DIAGNOSIS — Z954 Presence of other heart-valve replacement: Secondary | ICD-10-CM | POA: Diagnosis not present

## 2014-11-11 DIAGNOSIS — Z952 Presence of prosthetic heart valve: Secondary | ICD-10-CM

## 2014-11-11 NOTE — Assessment & Plan Note (Signed)
History of normally function with diastolic heart failure currently taking 120 mg of Lasix daily with improvement in her symptoms.

## 2014-11-11 NOTE — Progress Notes (Signed)
11/11/2014 Perrin Maltese   12/14/1945  [id number]  Primary Physician Purvis Kilts, MD Primary Cardiologist: Lorretta Harp MD Renae Gloss   HPI:   The patient is a 69 year old moderately overweight married Caucasian female with no children, whose husband is also a patient of mine and is accompanying her today. I saw her last 04/14/13.Marland Kitchen She has a history of critical aortic stenosis status post St. Jude AVR with a 21 mm St. Jude Regent mechanical valve by Dr. Arvid Right, August 01, 2010. She had normal coronary arteries and normal left ventricular function. She did have some perioperative PAF. She has sleep apnea, intolerant to CPAP. Her other problems include hypertension, hyperlipidemia intolerant to statin drugs, and non-insulin-requiring diabetes. She is otherwise asymptomatic. Early last year she fell down and hit her head and her subarachnoid hematoma and was off Coumadin for a month after that.  She does desaturate with ambulation and has what sounds like diastolic dysfunction as well as mild to moderate renal insufficiency followed by Dr. [name] her serum creatinine runs in the 1.6 range.. She currently takes 120 mg of furosemide daily which improves her breathing.her last lipid profile performed by her nephrologist 11/01/14 revealed total cholesterol 200, LDL 116 and HDL of 48.    Current Outpatient Prescriptions  Medication Sig Dispense Refill  . acetaminophen (TYLENOL) 325 MG tablet Take 2 tablets (650 mg total) by mouth every 4 (four) hours as needed for headache.    . cyanocobalamin (,VITAMIN B-12,) 1000 MCG/ML injection Inject 1,000 mcg into the muscle every 30 (thirty) days.    Mariane Baumgarten Sodium (COLACE PO) Take by mouth as needed.    Marland Kitchen escitalopram (LEXAPRO) 20 MG tablet Take 20 mg by mouth.    . FIBER SELECT GUMMIES CHEW Chew 1 tablet by mouth 2 (two) times daily.    . fluticasone (FLONASE) 50 MCG/ACT nasal spray Place 2 sprays into the nose daily as  needed for allergies or rhinitis.     . furosemide (LASIX) 40 MG tablet Take 1 tablet (40 mg total) by mouth daily at 3 pm. 30 tablet 0  . furosemide (LASIX) 80 MG tablet Take 1 tablet (80 mg total) by mouth every morning. 30 tablet 0  . hydrocortisone 2.5 % cream APPLY RECTALLY 3 TO 4 TIMES DAILY AS NEEDED. 30 g 2  . insulin lispro protamine-lispro (HUMALOG 75/25 MIX) (75-25) 100 UNIT/ML SUSP injection Inject into the skin. 35 units in AM; 20 units in PM    . loratadine (CLARITIN) 10 MG tablet Take 10 mg by mouth every morning.    . warfarin (COUMADIN) 5 MG tablet TAKE ONE TO ONE AND ONE-HALF TABLET BY MOUTH EVERY DAY 40 tablet 3   No current facility-administered medications for this visit.    Allergies  Allergen Reactions  . Demerol Other (See Comments)    Blood pressure goes up  . Meperidine Other (See Comments)    BP fluctuations  . Naprosyn [Naproxen] Swelling    Fluid retention Legs swelling    History   Social History  . Marital Status: Married    Spouse Name: N/A  . Number of Children: N/A  . Years of Education: N/A   Occupational History  . Not on file.   Social History Main Topics  . Smoking status: Never Smoker   . Smokeless tobacco: Never Used  . Alcohol Use: No  . Drug Use: No  . Sexual Activity: Not Currently    Birth Control/ Protection:  Post-menopausal   Other Topics Concern  . Not on file   Social History Narrative     Review of Systems: General: negative for chills, fever, night sweats or weight changes.  Cardiovascular: negative for chest pain, dyspnea on exertion, edema, orthopnea, palpitations, paroxysmal nocturnal dyspnea or shortness of breath Dermatological: negative for rash Respiratory: negative for cough or wheezing Urologic: negative for hematuria Abdominal: negative for nausea, vomiting, diarrhea, bright red blood per rectum, melena, or hematemesis Neurologic: negative for visual changes, syncope, or dizziness All other systems  reviewed and are otherwise negative except as noted above.    Blood pressure 140/62, pulse 84, height 5' 3.5" (1.613 m), weight 260 lb 6.4 oz (118.117 kg).  General appearance: alert and no distress Neck: no adenopathy, no carotid bruit, no JVD, supple, symmetrical, trachea midline and thyroid not enlarged, symmetric, no tenderness/mass/nodules Lungs: clear to auscultation bilaterally Heart: crisp prosthetic valve sounds Extremities: 1-2+ pitting edema bilaterally  EKG normal sinus rhythm at 84 troponin branch block. I personally reviewed this EKG  ASSESSMENT AND PLAN:   S/P AVR (aortic valve replacement) Status post St. Jude aVR performed by Dr. Cyndia Bent 08/01/10 with a history of normal coronary arteries. 2-D echo performed in May of last year revealed normal LV systolic function with a well-functioning aortic mechanical prosthesis.   Hypertension History of hypertension blood pressure measured 140/62. She is on furosemide only.   Acute diastolic CHF (congestive heart failure) History of normally function with diastolic heart failure currently taking 120 mg of Lasix daily with improvement in her symptoms.       Lorretta Harp MD FACP,FACC,FAHA, [facility] 11/11/2014 3:37 PM

## 2014-11-11 NOTE — Assessment & Plan Note (Signed)
Status post St. Jude aVR performed by Dr. Cyndia Bent 08/01/10 with a history of normal coronary arteries. 2-D echo performed in May of last year revealed normal LV systolic function with a well-functioning aortic mechanical prosthesis.

## 2014-11-11 NOTE — Assessment & Plan Note (Signed)
History of hypertension blood pressure measured 140/62. She is on furosemide only.

## 2014-11-11 NOTE — Patient Instructions (Signed)
Echocardiogram (In May). Echocardiography is a painless test that uses sound waves to create images of your heart. It provides your doctor with information about the size and shape of your heart and how well your heart's chambers and valves are working. This procedure takes approximately one hour. There are no restrictions for this procedure.  Your physician wants you to follow-up in 1 year with Dr. Gwenlyn Found. You will receive a reminder letter in the mail 2 months in advance. If you do not receive a letter, please call our office to schedule the follow-up appointment.

## 2014-11-15 ENCOUNTER — Encounter: Payer: Self-pay | Admitting: Cardiovascular Disease

## 2014-11-23 ENCOUNTER — Telehealth: Payer: Self-pay | Admitting: *Deleted

## 2014-11-23 NOTE — Telephone Encounter (Signed)
Pt had has a few light nose bleeds.  Doesn't know if her blood is too thin or if she has a sinus infection.  Is going to see PCP tomorrow.  Told pt to take coumadin 1 tablet tonight instead of 1 1/2 tablets then resume regular dose.  Encouraged her to use Saline Nasal spray bid/prn as needed for dryness.  Informed her to call me back if PCP starts her on an antibiotic or if nose bleeds become worse or more frequent.  She verbalized understanding.

## 2014-11-23 NOTE — Telephone Encounter (Signed)
Please call patient regarding nose bleed / tgs

## 2014-11-24 ENCOUNTER — Ambulatory Visit: Payer: Medicare Other | Admitting: Cardiovascular Disease

## 2014-11-24 DIAGNOSIS — Z6841 Body Mass Index (BMI) 40.0 and over, adult: Secondary | ICD-10-CM | POA: Diagnosis not present

## 2014-11-24 DIAGNOSIS — R040 Epistaxis: Secondary | ICD-10-CM | POA: Diagnosis not present

## 2014-11-24 DIAGNOSIS — D510 Vitamin B12 deficiency anemia due to intrinsic factor deficiency: Secondary | ICD-10-CM | POA: Diagnosis not present

## 2014-12-01 DIAGNOSIS — E1165 Type 2 diabetes mellitus with hyperglycemia: Secondary | ICD-10-CM | POA: Diagnosis not present

## 2014-12-07 ENCOUNTER — Ambulatory Visit (INDEPENDENT_AMBULATORY_CARE_PROVIDER_SITE_OTHER): Payer: Medicare Other | Admitting: *Deleted

## 2014-12-07 DIAGNOSIS — Z952 Presence of prosthetic heart valve: Secondary | ICD-10-CM

## 2014-12-07 DIAGNOSIS — Z7901 Long term (current) use of anticoagulants: Secondary | ICD-10-CM

## 2014-12-07 DIAGNOSIS — Z954 Presence of other heart-valve replacement: Secondary | ICD-10-CM | POA: Diagnosis not present

## 2014-12-07 LAB — POCT INR: INR: 2.2

## 2014-12-19 DIAGNOSIS — D631 Anemia in chronic kidney disease: Secondary | ICD-10-CM | POA: Diagnosis not present

## 2014-12-19 DIAGNOSIS — N2581 Secondary hyperparathyroidism of renal origin: Secondary | ICD-10-CM | POA: Diagnosis not present

## 2014-12-19 DIAGNOSIS — Z7689 Persons encountering health services in other specified circumstances: Secondary | ICD-10-CM | POA: Diagnosis not present

## 2014-12-19 DIAGNOSIS — N183 Chronic kidney disease, stage 3 (moderate): Secondary | ICD-10-CM | POA: Diagnosis not present

## 2014-12-19 DIAGNOSIS — I129 Hypertensive chronic kidney disease with stage 1 through stage 4 chronic kidney disease, or unspecified chronic kidney disease: Secondary | ICD-10-CM | POA: Diagnosis not present

## 2014-12-21 ENCOUNTER — Telehealth (HOSPITAL_COMMUNITY): Payer: Self-pay | Admitting: Cardiovascular Disease

## 2014-12-21 ENCOUNTER — Ambulatory Visit (INDEPENDENT_AMBULATORY_CARE_PROVIDER_SITE_OTHER): Payer: Medicare Other | Admitting: *Deleted

## 2014-12-21 DIAGNOSIS — Z952 Presence of prosthetic heart valve: Secondary | ICD-10-CM

## 2014-12-21 DIAGNOSIS — D509 Iron deficiency anemia, unspecified: Secondary | ICD-10-CM | POA: Diagnosis not present

## 2014-12-21 DIAGNOSIS — D594 Other nonautoimmune hemolytic anemias: Secondary | ICD-10-CM | POA: Diagnosis not present

## 2014-12-21 DIAGNOSIS — D508 Other iron deficiency anemias: Secondary | ICD-10-CM | POA: Diagnosis not present

## 2014-12-21 DIAGNOSIS — C50219 Malignant neoplasm of upper-inner quadrant of unspecified female breast: Secondary | ICD-10-CM | POA: Diagnosis not present

## 2014-12-21 DIAGNOSIS — Z7901 Long term (current) use of anticoagulants: Secondary | ICD-10-CM

## 2014-12-21 DIAGNOSIS — D696 Thrombocytopenia, unspecified: Secondary | ICD-10-CM | POA: Diagnosis not present

## 2014-12-21 DIAGNOSIS — Z954 Presence of other heart-valve replacement: Secondary | ICD-10-CM | POA: Diagnosis not present

## 2014-12-21 LAB — POCT INR: INR: 3

## 2014-12-21 NOTE — Telephone Encounter (Signed)
Received records from [facility] for appointment with Dr Claiborne Billings on 01/12/15.  Records given to [facility] (medical records) for Dr Evette Georges schedule on 01/12/15.  lp

## 2014-12-27 DIAGNOSIS — Z961 Presence of intraocular lens: Secondary | ICD-10-CM | POA: Diagnosis not present

## 2015-01-09 DIAGNOSIS — D638 Anemia in other chronic diseases classified elsewhere: Secondary | ICD-10-CM | POA: Diagnosis not present

## 2015-01-09 DIAGNOSIS — D509 Iron deficiency anemia, unspecified: Secondary | ICD-10-CM | POA: Diagnosis not present

## 2015-01-09 DIAGNOSIS — Z6841 Body Mass Index (BMI) 40.0 and over, adult: Secondary | ICD-10-CM | POA: Diagnosis not present

## 2015-01-09 DIAGNOSIS — F321 Major depressive disorder, single episode, moderate: Secondary | ICD-10-CM | POA: Diagnosis not present

## 2015-01-11 DIAGNOSIS — N183 Chronic kidney disease, stage 3 (moderate): Secondary | ICD-10-CM | POA: Diagnosis not present

## 2015-01-11 DIAGNOSIS — I129 Hypertensive chronic kidney disease with stage 1 through stage 4 chronic kidney disease, or unspecified chronic kidney disease: Secondary | ICD-10-CM | POA: Diagnosis not present

## 2015-01-12 ENCOUNTER — Ambulatory Visit: Payer: Medicare Other | Admitting: Cardiovascular Disease

## 2015-01-18 ENCOUNTER — Ambulatory Visit (INDEPENDENT_AMBULATORY_CARE_PROVIDER_SITE_OTHER): Payer: Medicare Other | Admitting: *Deleted

## 2015-01-18 DIAGNOSIS — Z954 Presence of other heart-valve replacement: Secondary | ICD-10-CM

## 2015-01-18 DIAGNOSIS — Z952 Presence of prosthetic heart valve: Secondary | ICD-10-CM

## 2015-01-18 DIAGNOSIS — Z7901 Long term (current) use of anticoagulants: Secondary | ICD-10-CM | POA: Diagnosis not present

## 2015-01-18 LAB — POCT INR: INR: 3.3

## 2015-01-29 ENCOUNTER — Emergency Department (HOSPITAL_COMMUNITY): Payer: Medicare Other

## 2015-01-29 ENCOUNTER — Inpatient Hospital Stay (HOSPITAL_COMMUNITY)
Admission: EM | Admit: 2015-01-29 | Discharge: 2015-01-31 | DRG: 291 | Disposition: A | Discharge status: Home / Self Care | Payer: Medicare Other | Attending: Internal Medicine | Admitting: Internal Medicine

## 2015-01-29 ENCOUNTER — Encounter (HOSPITAL_COMMUNITY): Payer: Self-pay | Admitting: Emergency Medicine

## 2015-01-29 DIAGNOSIS — Z833 Family history of diabetes mellitus: Secondary | ICD-10-CM

## 2015-01-29 DIAGNOSIS — I509 Heart failure, unspecified: Secondary | ICD-10-CM | POA: Diagnosis not present

## 2015-01-29 DIAGNOSIS — J9601 Acute respiratory failure with hypoxia: Secondary | ICD-10-CM | POA: Diagnosis not present

## 2015-01-29 DIAGNOSIS — Z954 Presence of other heart-valve replacement: Secondary | ICD-10-CM

## 2015-01-29 DIAGNOSIS — E1129 Type 2 diabetes mellitus with other diabetic kidney complication: Secondary | ICD-10-CM

## 2015-01-29 DIAGNOSIS — Z6841 Body Mass Index (BMI) 40.0 and over, adult: Secondary | ICD-10-CM | POA: Diagnosis not present

## 2015-01-29 DIAGNOSIS — Z8249 Family history of ischemic heart disease and other diseases of the circulatory system: Secondary | ICD-10-CM

## 2015-01-29 DIAGNOSIS — I129 Hypertensive chronic kidney disease with stage 1 through stage 4 chronic kidney disease, or unspecified chronic kidney disease: Secondary | ICD-10-CM | POA: Diagnosis not present

## 2015-01-29 DIAGNOSIS — G4733 Obstructive sleep apnea (adult) (pediatric): Secondary | ICD-10-CM | POA: Diagnosis not present

## 2015-01-29 DIAGNOSIS — N183 Chronic kidney disease, stage 3 unspecified: Secondary | ICD-10-CM

## 2015-01-29 DIAGNOSIS — Z7901 Long term (current) use of anticoagulants: Secondary | ICD-10-CM | POA: Diagnosis not present

## 2015-01-29 DIAGNOSIS — Z853 Personal history of malignant neoplasm of breast: Secondary | ICD-10-CM

## 2015-01-29 DIAGNOSIS — D649 Anemia, unspecified: Secondary | ICD-10-CM | POA: Diagnosis not present

## 2015-01-29 DIAGNOSIS — Z952 Presence of prosthetic heart valve: Secondary | ICD-10-CM | POA: Diagnosis not present

## 2015-01-29 DIAGNOSIS — I5031 Acute diastolic (congestive) heart failure: Secondary | ICD-10-CM | POA: Diagnosis not present

## 2015-01-29 DIAGNOSIS — Z794 Long term (current) use of insulin: Secondary | ICD-10-CM

## 2015-01-29 DIAGNOSIS — E119 Type 2 diabetes mellitus without complications: Secondary | ICD-10-CM | POA: Diagnosis present

## 2015-01-29 DIAGNOSIS — I5033 Acute on chronic diastolic (congestive) heart failure: Secondary | ICD-10-CM | POA: Diagnosis not present

## 2015-01-29 DIAGNOSIS — R0600 Dyspnea, unspecified: Secondary | ICD-10-CM

## 2015-01-29 DIAGNOSIS — I10 Essential (primary) hypertension: Secondary | ICD-10-CM | POA: Diagnosis present

## 2015-01-29 DIAGNOSIS — G4730 Sleep apnea, unspecified: Secondary | ICD-10-CM | POA: Diagnosis present

## 2015-01-29 DIAGNOSIS — E669 Obesity, unspecified: Secondary | ICD-10-CM | POA: Diagnosis not present

## 2015-01-29 DIAGNOSIS — R0902 Hypoxemia: Secondary | ICD-10-CM | POA: Diagnosis not present

## 2015-01-29 DIAGNOSIS — E780 Pure hypercholesterolemia: Secondary | ICD-10-CM | POA: Diagnosis present

## 2015-01-29 DIAGNOSIS — R0602 Shortness of breath: Secondary | ICD-10-CM | POA: Diagnosis not present

## 2015-01-29 DIAGNOSIS — N189 Chronic kidney disease, unspecified: Secondary | ICD-10-CM | POA: Diagnosis present

## 2015-01-29 DIAGNOSIS — I050 Rheumatic mitral stenosis: Secondary | ICD-10-CM | POA: Diagnosis not present

## 2015-01-29 DIAGNOSIS — E1122 Type 2 diabetes mellitus with diabetic chronic kidney disease: Secondary | ICD-10-CM | POA: Diagnosis not present

## 2015-01-29 DIAGNOSIS — F329 Major depressive disorder, single episode, unspecified: Secondary | ICD-10-CM | POA: Diagnosis not present

## 2015-01-29 HISTORY — DX: Chronic kidney disease, stage 3 unspecified: N18.30

## 2015-01-29 HISTORY — DX: Chronic kidney disease, stage 3 (moderate): N18.3

## 2015-01-29 HISTORY — DX: Unspecified diastolic (congestive) heart failure: I50.30

## 2015-01-29 HISTORY — DX: Type 2 diabetes mellitus without complications: E11.9

## 2015-01-29 HISTORY — DX: Hyperlipidemia, unspecified: E78.5

## 2015-01-29 HISTORY — DX: Essential (primary) hypertension: I10

## 2015-01-29 LAB — CBC WITH DIFFERENTIAL/PLATELET
BASOS ABS: 0 10*3/uL (ref 0.0–0.1)
Basophils Relative: 0 % (ref 0–1)
Eosinophils Absolute: 0.1 10*3/uL (ref 0.0–0.7)
Eosinophils Relative: 1 % (ref 0–5)
HCT: 37.1 % (ref 36.0–46.0)
HEMOGLOBIN: 11.6 g/dL — AB (ref 12.0–15.0)
Lymphocytes Relative: 31 % (ref 12–46)
Lymphs Abs: 2.7 10*3/uL (ref 0.7–4.0)
MCH: 26.3 pg (ref 26.0–34.0)
MCHC: 31.3 g/dL (ref 30.0–36.0)
MCV: 84.1 fL (ref 78.0–100.0)
MONO ABS: 1.1 10*3/uL — AB (ref 0.1–1.0)
Monocytes Relative: 13 % — ABNORMAL HIGH (ref 3–12)
NEUTROS ABS: 4.7 10*3/uL (ref 1.7–7.7)
Neutrophils Relative %: 54 % (ref 43–77)
PLATELETS: 92 10*3/uL — AB (ref 150–400)
RBC: 4.41 MIL/uL (ref 3.87–5.11)
RDW: 14.3 % (ref 11.5–15.5)
WBC: 8.6 10*3/uL (ref 4.0–10.5)

## 2015-01-29 LAB — GLUCOSE, CAPILLARY: Glucose-Capillary: 260 mg/dL — ABNORMAL HIGH (ref 70–99)

## 2015-01-29 LAB — COMPREHENSIVE METABOLIC PANEL
ALBUMIN: 4 g/dL (ref 3.5–5.0)
ALK PHOS: 72 U/L (ref 38–126)
ALT: 15 U/L (ref 14–54)
AST: 22 U/L (ref 15–41)
Anion gap: 9 (ref 5–15)
BILIRUBIN TOTAL: 2.6 mg/dL — AB (ref 0.3–1.2)
BUN: 52 mg/dL — ABNORMAL HIGH (ref 6–20)
CHLORIDE: 103 mmol/L (ref 101–111)
CO2: 27 mmol/L (ref 22–32)
Calcium: 8.9 mg/dL (ref 8.9–10.3)
Creatinine, Ser: 1.56 mg/dL — ABNORMAL HIGH (ref 0.44–1.00)
GFR calc non Af Amer: 33 mL/min — ABNORMAL LOW (ref >60)
GFR, EST AFRICAN AMERICAN: 38 mL/min — AB (ref >60)
GLUCOSE: 262 mg/dL — AB (ref 70–99)
Potassium: 4.3 mmol/L (ref 3.5–5.1)
SODIUM: 139 mmol/L (ref 135–145)
TOTAL PROTEIN: 7.5 g/dL (ref 6.5–8.1)

## 2015-01-29 LAB — BRAIN NATRIURETIC PEPTIDE: B Natriuretic Peptide: 484 pg/mL — ABNORMAL HIGH (ref 0.0–100.0)

## 2015-01-29 LAB — PROTIME-INR
INR: 2.97 — ABNORMAL HIGH (ref 0.00–1.49)
Prothrombin Time: 31.1 seconds — ABNORMAL HIGH (ref 11.6–15.2)

## 2015-01-29 LAB — TROPONIN I: Troponin I: 0.03 ng/mL (ref <0.031)

## 2015-01-29 LAB — TSH: TSH: 1.528 u[IU]/mL (ref 0.350–4.500)

## 2015-01-29 LAB — D-DIMER, QUANTITATIVE (NOT AT ARMC): D-Dimer, Quant: <0.27 ug/mL-FEU (ref 0.00–0.48)

## 2015-01-29 MED ORDER — FUROSEMIDE 10 MG/ML IJ SOLN
80.0000 mg | Freq: Once | INTRAMUSCULAR | Status: AC
  Administered 2015-01-29: 80 mg via INTRAVENOUS
  Filled 2015-01-29: qty 8

## 2015-01-29 MED ORDER — DOCUSATE SODIUM 100 MG PO CAPS
100.0000 mg | ORAL_CAPSULE | Freq: Two times a day (BID) | ORAL | Status: DC
  Administered 2015-01-30 – 2015-01-31 (×2): 100 mg via ORAL
  Filled 2015-01-29 (×4): qty 1

## 2015-01-29 MED ORDER — FOLIC ACID 1 MG PO TABS
1.0000 mg | ORAL_TABLET | Freq: Every day | ORAL | Status: DC
  Filled 2015-01-29 (×3): qty 1

## 2015-01-29 MED ORDER — LORATADINE 10 MG PO TABS: 10.0000 mg | ORAL_TABLET | Freq: Every day | ORAL | Status: DC | PRN

## 2015-01-29 MED ORDER — INSULIN GLARGINE 100 UNIT/ML ~~LOC~~ SOLN
20.0000 [IU] | Freq: Two times a day (BID) | SUBCUTANEOUS | Status: DC
  Administered 2015-01-29 – 2015-01-31 (×4): 20 [IU] via SUBCUTANEOUS
  Filled 2015-01-29 (×6): qty 0.2

## 2015-01-29 MED ORDER — FLUTICASONE PROPIONATE 50 MCG/ACT NA SUSP
2.0000 | Freq: Every day | NASAL | Status: DC
  Administered 2015-01-31: 2 via NASAL
  Filled 2015-01-29 (×2): qty 16

## 2015-01-29 MED ORDER — WARFARIN SODIUM 5 MG PO TABS
5.0000 mg | ORAL_TABLET | Freq: Once | ORAL | Status: AC
  Administered 2015-01-29: 5 mg via ORAL
  Filled 2015-01-29: qty 1

## 2015-01-29 MED ORDER — DIPHENHYDRAMINE HCL 25 MG PO CAPS: 25.0000 mg | ORAL_CAPSULE | Freq: Every evening | ORAL | Status: DC | PRN

## 2015-01-29 MED ORDER — SODIUM CHLORIDE 0.9 % IJ SOLN
3.0000 mL | Freq: Two times a day (BID) | INTRAMUSCULAR | Status: DC
  Administered 2015-01-29 – 2015-01-31 (×4): 3 mL via INTRAVENOUS

## 2015-01-29 MED ORDER — LORAZEPAM 0.5 MG PO TABS: 0.2500 mg | ORAL_TABLET | Freq: Three times a day (TID) | ORAL | Status: DC | PRN

## 2015-01-29 MED ORDER — ACETAMINOPHEN 325 MG PO TABS
650.0000 mg | ORAL_TABLET | ORAL | Status: DC | PRN
Start: 2015-01-29 — End: 2015-01-31
  Administered 2015-01-29 – 2015-01-31 (×4): 650 mg via ORAL
  Filled 2015-01-29 (×4): qty 2

## 2015-01-29 MED ORDER — WARFARIN - PHARMACIST DOSING INPATIENT: Status: DC

## 2015-01-29 MED ORDER — ESCITALOPRAM OXALATE 10 MG PO TABS
10.0000 mg | ORAL_TABLET | Freq: Every day | ORAL | Status: DC
  Administered 2015-01-30 – 2015-01-31 (×2): 10 mg via ORAL
  Filled 2015-01-29 (×3): qty 1

## 2015-01-29 MED ORDER — FUROSEMIDE 10 MG/ML IJ SOLN
40.0000 mg | Freq: Two times a day (BID) | INTRAMUSCULAR | Status: DC
  Administered 2015-01-30: 40 mg via INTRAVENOUS
  Filled 2015-01-29: qty 4

## 2015-01-29 MED ORDER — INSULIN ASPART 100 UNIT/ML ~~LOC~~ SOLN
0.0000 [IU] | SUBCUTANEOUS | Status: DC
  Administered 2015-01-29 – 2015-01-30 (×2): 8 [IU] via SUBCUTANEOUS
  Administered 2015-01-30: 3 [IU] via SUBCUTANEOUS
  Administered 2015-01-30: 5 [IU] via SUBCUTANEOUS
  Administered 2015-01-30: 8 [IU] via SUBCUTANEOUS
  Administered 2015-01-30 – 2015-01-31 (×3): 3 [IU] via SUBCUTANEOUS
  Administered 2015-01-31: 5 [IU] via SUBCUTANEOUS

## 2015-01-29 NOTE — ED Notes (Signed)
Patient c/o shortness of breath and generalized weakness. Per patient has been short of breath x6 weeks but progressively getting worse and intolerable in past 2 weeks. Denies any chest pain or dizziness. Per patient all started after taking Cymbalta. Per patient talked to PCP Friday and was told to stop taking it and use lexapro instead. Per patient now has blisters on arms and right breast x2 days. Patient has hx of CHF and heart valve replacement.

## 2015-01-29 NOTE — Progress Notes (Signed)
[place] for Warfarin Indication: AVR  Allergies  Allergen Reactions  . Demerol Other (See Comments)    Blood pressure goes up  . Meperidine Other (See Comments)    BP fluctuations  . Naprosyn [Naproxen] Swelling    Fluid retention Legs swelling    Patient Measurements: Height: 5' 3.5" (161.3 cm) Weight: 254 lb (115.214 kg) IBW/kg (Calculated) : 53.55   Vital Signs: Temp: 98.4 F (36.9 C) (05/01 1952) Temp Source: Oral (05/01 1952) BP: 142/56 mmHg (05/01 1952) Pulse Rate: 86 (05/01 1952)  Labs:  Recent Labs  01/29/15 1037 01/29/15 1228  HGB 11.6*  --   HCT 37.1  --   PLT 92*  --   LABPROT  --  31.1*  INR  --  2.97*  CREATININE 1.56*  --   TROPONINI 0.03  --    Estimated Creatinine Clearance: 41.4 mL/min (by C-G formula based on Cr of 1.56).  Medical History: Past Medical History  Diagnosis Date  . Diabetes mellitus   . Hypertension   . High cholesterol   . Breast cancer 11/2003  . Colonic adenoma   . OSA (obstructive sleep apnea)     AHI 45.0/hr, REM sleep was 231 mins.sleep was 77% and 84%, RDI was 21.5/hr total sleep was 2h 58 mins  . Hx of echocardiogram 04/28/2012    EF>55%, Moderate to severe mitral annular calcification, mild mitral regurgitation, mild mitral stenosis, mild tricuspid regurgitation, the prosthetic aortic valve is not well visualized, 21 mm St Jude Regent michanical valve there is aortic root sclerosis/calcification. 105mmhg midcavity gradient.  . History of stress test 04/2008    Normal myocardial perfusion scan demonstrating an attenuationartifact in the anterior region of the myocardium. No ischemia or infarct/scar is seen in the remaining myocardium. compared to the previous study, there is no significant change. No significant ischemia demonstrated. this is a low risk study.  . Aortic stenosis   . H/O aortic valve replacement   . Scalp laceration 12/21/2013  . Traumatic subarachnoid hemorrhage  12/21/2013  . Chronic anemia   . Chronic abdominal pain   . Renal disorder     Stage 3 kidney disease.  . CHF (congestive heart failure)    Medications:  Prescriptions prior to admission  Medication Sig Dispense Refill Last Dose  . acetaminophen (TYLENOL) 325 MG tablet Take 2 tablets (650 mg total) by mouth every 4 (four) hours as needed for headache.   01/28/2015 at Unknown time  . cyanocobalamin (,VITAMIN B-12,) 1000 MCG/ML injection Inject 1,000 mcg into the muscle every 30 (thirty) days.   Past Month at Unknown time  . Docusate Sodium (COLACE PO) Take 1 capsule by mouth as needed (constipation).    unknown  . escitalopram (LEXAPRO) 20 MG tablet Take 1 tablet by mouth daily.   01/28/2015 at Unknown time  . FIBER SELECT GUMMIES CHEW Chew 1 tablet by mouth 2 (two) times daily.   01/28/2015 at Unknown time  . fluticasone (FLONASE) 50 MCG/ACT nasal spray Place 2 sprays into the nose daily as needed for allergies or rhinitis.    unknown  . folic acid (FOLVITE) 1 MG tablet Take 1 mg by mouth daily.   01/28/2015 at Unknown time  . furosemide (LASIX) 40 MG tablet Take 1 tablet (40 mg total) by mouth daily at 3 pm. 30 tablet 0 01/28/2015 at Unknown time  . furosemide (LASIX) 80 MG tablet Take 1 tablet (80 mg total) by mouth every morning. 30 tablet 0  01/28/2015 at Unknown time  . hydrocortisone 2.5 % cream APPLY RECTALLY 3 TO 4 TIMES DAILY AS NEEDED. 30 g 2 unknown  . Insulin Lispro Prot & Lispro (HUMALOG 75/25 MIX) (75-25) 100 UNIT/ML Kwikpen Inject 40 Units into the skin 2 (two) times daily.    01/28/2015 at Unknown time  . loratadine (CLARITIN) 10 MG tablet Take 10 mg by mouth daily as needed for allergies.    unknown at Unknown time  . LORazepam (ATIVAN) 0.5 MG tablet Take 1 tablet by mouth daily as needed.   unknown  . mupirocin cream (BACTROBAN) 2 % Apply 1 application topically daily as needed.   Past Week at Unknown time  . warfarin (COUMADIN) 5 MG tablet Take 5-7.5 mg by mouth daily. 5 mg daily  except 7.5 mg on Wednesday.   01/28/2015 at Unknown time  . DULoxetine (CYMBALTA) 30 MG capsule Take 30 mg by mouth daily.   01/27/2015  . warfarin (COUMADIN) 5 MG tablet TAKE ONE TO ONE AND ONE-HALF TABLET BY MOUTH EVERY DAY (Patient not taking: Reported on 01/29/2015) 40 tablet 3 Taking    Assessment: Okay for Protocol, INR @ goal.  Admitted for CHF, no bleeding noted.  Goal of Therapy:  INR 2.5-3.5   Plan:  Warfarin 5mg  PO x 1 Daily PT/INR  Pricilla Larsson 01/29/2015,8:43 PM

## 2015-01-29 NOTE — Progress Notes (Signed)
Spoke with Jasmine at [facility] and confirmed fax was received regarding oxygen request.  CM was informed by [place] that patient to sign a liability form if patient D/C from ED to home with oxygen.  Patient will need to follow up with PCP regarding oxygen/testing pulse ox to confirm need for oxygen.  Explained information to patient and family regarding the oxygen/liability and following up with PCP.  Communicated with nurse and Dr. Stark Jock regarding the patient and oxygen and d/c from ED.

## 2015-01-29 NOTE — Progress Notes (Signed)
Received follow up regarding patient from healthcare staff.  Patient to be admitted into hospital and [facility] to deliver oxygen for patient to have at hospital once ready for d/c.

## 2015-01-29 NOTE — ED Notes (Signed)
When patient is taken off of oxygen, o2 drops to 78-80% on RA. Md made aware and in contact with a case worker to discuss options for patient.

## 2015-01-29 NOTE — Discharge Instructions (Signed)
Follow-up with your doctor for checkup as soon as possible.   Hypoxemia Hypoxemia occurs when your blood does not contain enough oxygen. The body cannot work well when it does not have enough oxygen because every part of your body needs oxygen. Oxygen travels to all parts of the body through your blood. Hypoxemia can develop suddenly or can come on slowly. CAUSES Some common causes of hypoxemia include:  Long-term (chronic) lung diseases, such as chronic obstructive pulmonary disease (COPD) or interstitial lung disease.  Disorders that affect breathing at night, such as sleep apnea.  Fluid buildup in your lungs (pulmonary edema).  Lung infection (pneumonia).  Lung or throat cancer.  Abnormal blood flow that bypasses the lungs (shunt).  Certain diseasesthat affect nerves or muscles.  A collapsed lung (pneumothorax).  A blood clot in the lungs (pulmonary embolus).  Certain types of heart disease.  Slow or shallow breathing (hypoventilation).  Certain medicines.  High altitudes.  Toxic chemicals and gases. SIGNS AND SYMPTOMS Not everyone who has hypoxemia will develop symptoms. If the hypoxemia developed quickly, you will likely have symptoms such as shortness of breath. If the hypoxemia came on slowly over months or years, you may not notice any symptoms. Symptoms can include:  Shortness of breath (dyspnea).  Bluish color of the skin, lips, or nail beds.  Breathing that is fast, noisy, or shallow.  A fast heartbeat.  Feeling tired or sleepy.  Being confused or feeling anxious. DIAGNOSIS To determine if you have hypoxemia, your health care provider may perform:  A physical exam.  Blood tests.  A pulse oximetry. A sensor will be put on your finger, toe, or earlobe to measure the percent of oxygen in your blood. TREATMENT You will likely be treated with oxygen therapy. Depending on the cause of your hypoxemia, you may need oxygen for a short time (weeks or  months), or you may need it indefinitely. Your health care provider may also recommend other therapies to treat the underlying cause of your hypoxemia. HOME CARE INSTRUCTIONS  Only take over-the-counter or prescription medicines as directed by your health care provider.  Follow oxygen safety measures if you are on oxygen therapy. These may include:  Always having a backup supply of oxygen.  Not allowing anyone to smoke around oxygen.  Handling the oxygen tanks carefully and as instructed.  If you smoke, quit. Stay away from people who smoke.  Follow up with your health care provider as directed. SEEK MEDICAL CARE IF:  You have any concerns about your oxygen therapy.  You still have trouble breathing.  You become short of breath when you exercise.  You are tired when you wake up.  You have a headache when you wake up. SEEK IMMEDIATE MEDICAL CARE IF:   Your breathing gets worse.  You have new shortness of breath with normal activity.  You have a bluish color of the skin, lips, or nail beds.  You have confusion or cloudy thinking.  You cough up dark mucus.  You have chest pain.  You have a fever. MAKE SURE YOU:  Understand these instructions.  Will watch your condition.  Will get help right away if you are not doing well or get worse. Document Released: 04/01/2011 Document Revised: 09/21/2013 Document Reviewed: 04/15/2013 [facility] Patient Information 2015 [place], [place]. This information is not intended to replace advice given to you by your health care provider. Make sure you discuss any questions you have with your health care provider.

## 2015-01-29 NOTE — ED Notes (Signed)
Pt has small blisters noted to her right breast and underneath her left breast and side.

## 2015-01-29 NOTE — ED Provider Notes (Signed)
CSN: [id number]     Arrival date & time 01/29/15  1022 History   First MD Initiated Contact with Patient 01/29/15 1134     Chief Complaint  Patient presents with  . Shortness of Breath     (Consider location/radiation/quality/duration/timing/severity/associated sxs/prior Treatment) HPI   Hailey Curry is a 69 y.o. female who presents for evaluation of shortness of breath. Shortness of breath is intermittent. It is worse with exertion. She has been checking her oxygen saturation home and it has been low. She is taking her usual medications, without relief. She was treated with Cymbalta, but stopped taking it about a week ago in favor of Lexapro. She was not tolerating the Cymbalta. She feels like the Cymbalta caused her shortness of breath. She has noticed some blisters on her lower chest wall that she thinks is from the Cymbalta as well. She denies chest pain. She has mild weakness with ambulation. She's not had any anorexia. She does not have chest pain. She has been eating well. There are no other known modifying factors.  Oxygen saturation on arrival to the emergency department is 81%; this is abnormally low  Past Medical History  Diagnosis Date  . Diabetes mellitus   . Hypertension   . High cholesterol   . Breast cancer 11/2003  . Colonic adenoma   . OSA (obstructive sleep apnea)     AHI 45.0/hr, REM sleep was 231 mins.sleep was 77% and 84%, RDI was 21.5/hr total sleep was 2h 58 mins  . Hx of echocardiogram 04/28/2012    EF>55%, Moderate to severe mitral annular calcification, mild mitral regurgitation, mild mitral stenosis, mild tricuspid regurgitation, the prosthetic aortic valve is not well visualized, 21 mm St Jude Regent michanical valve there is aortic root sclerosis/calcification. 30mmhg midcavity gradient.  . History of stress test 04/2008    Normal myocardial perfusion scan demonstrating an attenuationartifact in the anterior region of the myocardium. No ischemia or  infarct/scar is seen in the remaining myocardium. compared to the previous study, there is no significant change. No significant ischemia demonstrated. this is a low risk study.  . Aortic stenosis   . H/O aortic valve replacement   . Scalp laceration 12/21/2013  . Traumatic subarachnoid hemorrhage 12/21/2013  . Chronic anemia   . Chronic abdominal pain   . Renal disorder     Stage 3 kidney disease.  . CHF (congestive heart failure)    Past Surgical History  Procedure Laterality Date  . Breast surgery  12/16/2003  . Aortic valve replacement  08/01/10    By Dr Arvid Right, St Jude AVR with 21 mm St Jude regent mechanical valve  . Colonoscopy  03/04/2003    Dr. Gala Romney- internal hemorrhoids o/w normal rectum, scattered L sided diveritula  . Colonoscopy  04/12/2009    Dr. Imogene Burn polyp, L dided diverticula, tubular adenoma on bx  . Colonoscopy N/A 01/06/2013    RMR; multiple colonic/rectal polyps s/p polypectomy. Fragments of tubular adenomas, needs surveillance Oct 2014  . Colonoscopy N/A 01/18/2013    [id number] polypectomy site with bleeding, s/p bleeding control therapy  . Cardiac catheterization  05/2010    Normal coronary arteries angiographically, normal left ventricular functionultrasonographically and severe aortic stenosis by right cath. Optimal therapy would be elective aortic valve replacement  . Doppler echocardiography    . Stress myocardial perfusion    . Ultrasound guidance for vascular access     Family History  Problem Relation Age of Onset  . Heart failure Mother   .  CAD    . Diabetes    . Other Brother     knee problems  . Hypertension Brother   . Heart disease Maternal Grandmother    History  Substance Use Topics  . Smoking status: Never Smoker   . Smokeless tobacco: Never Used  . Alcohol Use: No   OB History    Gravida Para Term Preterm AB TAB SAB Ectopic Multiple Living            0     Review of Systems  All other systems reviewed and are  negative.     Allergies  Demerol; Meperidine; and Naprosyn  Home Medications   Prior to Admission medications   Medication Sig Start Date End Date Taking? Authorizing Provider  acetaminophen (TYLENOL) 325 MG tablet Take 2 tablets (650 mg total) by mouth every 4 (four) hours as needed for headache. 12/22/13  Yes Lisette Abu, PA-C  cyanocobalamin (,VITAMIN B-12,) 1000 MCG/ML injection Inject 1,000 mcg into the muscle every 30 (thirty) days.   Yes Historical Provider, MD  Docusate Sodium (COLACE PO) Take 1 capsule by mouth as needed (constipation).    Yes Historical Provider, MD  escitalopram (LEXAPRO) 20 MG tablet Take 1 tablet by mouth daily. 12/23/14  Yes Historical Provider, MD  FIBER SELECT GUMMIES CHEW Chew 1 tablet by mouth 2 (two) times daily.   Yes Historical Provider, MD  fluticasone (FLONASE) 50 MCG/ACT nasal spray Place 2 sprays into the nose daily as needed for allergies or rhinitis.    Yes Historical Provider, MD  folic acid (FOLVITE) 1 MG tablet Take 1 mg by mouth daily.   Yes Historical Provider, MD  furosemide (LASIX) 40 MG tablet Take 1 tablet (40 mg total) by mouth daily at 3 pm. 05/18/14  Yes Belkys A Regalado, MD  furosemide (LASIX) 80 MG tablet Take 1 tablet (80 mg total) by mouth every morning. 05/18/14  Yes Belkys A Regalado, MD  hydrocortisone 2.5 % cream APPLY RECTALLY 3 TO 4 TIMES DAILY AS NEEDED. 09/05/14  Yes Estill Dooms, NP  Insulin Lispro Prot & Lispro (HUMALOG 75/25 MIX) (75-25) 100 UNIT/ML Kwikpen Inject 40 Units into the skin 2 (two) times daily.    Yes Historical Provider, MD  loratadine (CLARITIN) 10 MG tablet Take 10 mg by mouth daily as needed for allergies.    Yes Historical Provider, MD  LORazepam (ATIVAN) 0.5 MG tablet Take 1 tablet by mouth daily as needed. 11/12/14  Yes Historical Provider, MD  mupirocin cream (BACTROBAN) 2 % Apply 1 application topically daily as needed.   Yes Historical Provider, MD  warfarin (COUMADIN) 5 MG tablet Take 5-7.5  mg by mouth daily. 5 mg daily except 7.5 mg on Wednesday.   Yes Historical Provider, MD  DULoxetine (CYMBALTA) 30 MG capsule Take 30 mg by mouth daily.    Historical Provider, MD  warfarin (COUMADIN) 5 MG tablet TAKE ONE TO ONE AND ONE-HALF TABLET BY MOUTH EVERY DAY Patient not taking: Reported on 01/29/2015 08/22/14   Lorretta Harp, MD   BP 107/54 mmHg  Pulse 91  Temp(Src) 98.1 F (36.7 C) (Oral)  Resp 23  Ht 5' 3.5" (1.613 m)  Wt 254 lb (115.214 kg)  BMI 44.28 kg/m2  SpO2 90% Physical Exam  Constitutional: She is oriented to person, place, and time. She appears well-developed.  Obese  HENT:  Head: Normocephalic and atraumatic.  Right Ear: External ear normal.  Left Ear: External ear normal.  Eyes: Conjunctivae and EOM  are normal. Pupils are equal, round, and reactive to light.  Neck: Normal range of motion and phonation normal. Neck supple.  Cardiovascular: Normal rate, regular rhythm and normal heart sounds.   Pulmonary/Chest: Effort normal and breath sounds normal. She exhibits no bony tenderness.    Bilaterally with scattered rhonchi, no wheezes.  Abdominal: Soft. There is no tenderness.  Musculoskeletal: Normal range of motion.  Neurological: She is alert and oriented to person, place, and time. No cranial nerve deficit or sensory deficit. She exhibits normal muscle tone. Coordination normal.  Skin: Skin is warm, dry and intact.  Nonspecific rash of torso, characterized by 2-3 mm blood blisters, with areas of sloughing of blister leaving a reddened area. No associated drainage or petechiae.  Psychiatric: She has a normal mood and affect. Her behavior is normal. Judgment and thought content normal.  Nursing note and vitals reviewed.   ED Course  Procedures (including critical care time) Patient hypoxic on arrival, placed on nasal cannula oxygen at 2 L with improvement to 93%.   Medications  furosemide (LASIX) injection 80 mg (80 mg Intravenous Given 01/29/15 1233)     Patient Vitals for the past 24 hrs:  BP Temp Temp src Pulse Resp SpO2 Height Weight  01/29/15 1330 (!) 107/54 mmHg - - 91 23 90 % - -  01/29/15 1315 (!) 107/52 mmHg - - 87 20 91 % - -  01/29/15 1302 100/56 mmHg - - 93 20 94 % - -  01/29/15 1230 115/65 mmHg - - 96 21 93 % - -  01/29/15 1200 (!) 108/52 mmHg - - 92 20 93 % - -  01/29/15 1130 (!) 110/44 mmHg - - 98 22 92 % - -  01/29/15 1100 128/79 mmHg - - 95 18 94 % - -  01/29/15 1036 (!) 150/53 mmHg 98.1 F (36.7 C) Oral 105 26 93 % 5' 3.5" (1.613 m) 254 lb (115.214 kg)    14:15 PM Reevaluation with update and discussion. After initial assessment and treatment, an updated evaluation reveals she is uncomfortable walking to the bathroom with dyspnea, and desaturates. Head rest on room air. She is 88% on room air at this time. She denies chest pain, now. She feels comfortable going home with oxygen. Kalid Ghan L   Case management contacted to arrange for at home oxygen- they have contacted [facility], who will attempt to bring an oxygen canister here for the patient to use.  Labs Review Labs Reviewed  CBC WITH DIFFERENTIAL/PLATELET - Abnormal; Notable for the following:    Hemoglobin 11.6 (*)    Platelets 92 (*)    Monocytes Relative 13 (*)    Monocytes Absolute 1.1 (*)    All other components within normal limits  COMPREHENSIVE METABOLIC PANEL - Abnormal; Notable for the following:    Glucose, Bld 262 (*)    BUN 52 (*)    Creatinine, Ser 1.56 (*)    Total Bilirubin 2.6 (*)    GFR calc non Af Amer 33 (*)    GFR calc Af Amer 38 (*)    All other components within normal limits  BRAIN NATRIURETIC PEPTIDE - Abnormal; Notable for the following:    B Natriuretic Peptide 484.0 (*)    All other components within normal limits  TROPONIN I  D-DIMER, QUANTITATIVE    Imaging Review Dg Chest Portable 1 View  01/29/2015   CLINICAL DATA:  Shortness of breath and weakness  EXAM: PORTABLE CHEST - 1 VIEW  COMPARISON:  May 16, 2014  FINDINGS: New there is mild interstitial edema with cardiomegaly and pulmonary venous hypertension. Patient is status post median sternotomy. There is no airspace consolidation. No adenopathy. Patient is status post aortic valve replacement.  IMPRESSION: Evidence a degree of congestive heart failure. No airspace consolidation.   Electronically Signed   By: Lowella Grip III M.D.   On: 01/29/2015 10:54     EKG Interpretation None      MDM   Final diagnoses:  Dyspnea  Hypoxia    Nonspecific dyspnea with hypoxia. Possible relation to use of Cymbalta, which lists her dose of breath as a side effect of intolerance of Cymbalta. Doubt PE, or significant CHF as a source for her hypoxia.  Nursing Notes Reviewed/ Care Coordinated, and agree without changes. Applicable Imaging Reviewed.  Interpretation of Laboratory Data incorporated into ED treatment    Daleen Bo, MD 01/29/15 1601

## 2015-01-29 NOTE — Progress Notes (Signed)
Spoke with nurse regarding patient.  Patient to be placed on oxygen at home.  Spoke with patient and family member at bedside. Previous company used for [place] and will use the services again.  Spoke with Dr. Eulis Foster an order for home oxygen placed for 2 liters. Perrin spoke with [facility] regarding faxed order for home oxygen along with H&P.  Oxygen with equipment requested to be delivered to [facility] for patient to transport home.

## 2015-01-29 NOTE — ED Notes (Signed)
Case worker at bedside.

## 2015-01-29 NOTE — H&P (Signed)
[facility] History and Physical  SABRIAH HOBBINS QRF:[id number] DOB: 06/14/1946    PCP:   Purvis Kilts, MD   Chief Complaint:  Shortness of breath.   HPI: Hailey Curry is an 69 y.o. female with hx of DM (40 units Mixtard BID), hx of CKD stage III, breast cancer, HTN, s/p mechanical AVR on chronic anticoagulation, hx of OSA, hx of traumatic SAH, hx of anxiety recently placed on Cymbalta, prior on Lexapro, presented to the ER with DOE, SOB, and found to desat in the ER.  She was found to have elevated BNP of 400's, negative D dimer and troponin, and CXR showed CHF.  Her Cr was 1.5, and her WBC and Hb was stable.  She normally takes 120mg  oral Lasix, and had been given IV Lasix in the ER with improvement of her symptoms.  Her previous ECHO showed EF of 55%.  EKG showed Bifascicular blocks, unchanged from previous EKG.  Hospitalist was asked to admit her for CHF.  She is totally convinced she had an allergic Rxn to Cymbalta.   Rewiew of Systems:  Constitutional: Negative for malaise, fever and chills. No significant weight loss or weight gain Eyes: Negative for eye pain, redness and discharge, diplopia, visual changes, or flashes of light. ENMT: Negative for ear pain, hoarseness, nasal congestion, sinus pressure and sore throat. No headaches; tinnitus, drooling, or problem swallowing. Cardiovascular: Negative for chest pain, palpitations, diaphoresis,  and peripheral edema. ; No orthopnea, PND Respiratory: Negative for cough, hemoptysis, wheezing and stridor. No pleuritic chestpain. Gastrointestinal: Negative for nausea, vomiting, diarrhea, constipation, abdominal pain, melena, blood in stool, hematemesis, jaundice and rectal bleeding.    Genitourinary: Negative for frequency, dysuria, incontinence,flank pain and hematuria; Musculoskeletal: Negative for back pain and neck pain. Negative for swelling and trauma.;  Skin: . Negative for pruritus, rash, abrasions, bruising and skin  lesion.; ulcerations Neuro: Negative for headache, lightheadedness and neck stiffness. Negative for weakness, altered level of consciousness , altered mental status, extremity weakness, burning feet, involuntary movement, seizure and syncope.  Psych: negative for anxiety, depression, insomnia, tearfulness, panic attacks, hallucinations, paranoia, suicidal or homicidal ideation    Past Medical History  Diagnosis Date  . Diabetes mellitus   . Hypertension   . High cholesterol   . Breast cancer 11/2003  . Colonic adenoma   . OSA (obstructive sleep apnea)     AHI 45.0/hr, REM sleep was 231 mins.sleep was 77% and 84%, RDI was 21.5/hr total sleep was 2h 58 mins  . Hx of echocardiogram 04/28/2012    EF>55%, Moderate to severe mitral annular calcification, mild mitral regurgitation, mild mitral stenosis, mild tricuspid regurgitation, the prosthetic aortic valve is not well visualized, 21 mm St Jude Regent michanical valve there is aortic root sclerosis/calcification. 34mmhg midcavity gradient.  . History of stress test 04/2008    Normal myocardial perfusion scan demonstrating an attenuationartifact in the anterior region of the myocardium. No ischemia or infarct/scar is seen in the remaining myocardium. compared to the previous study, there is no significant change. No significant ischemia demonstrated. this is a low risk study.  . Aortic stenosis   . H/O aortic valve replacement   . Scalp laceration 12/21/2013  . Traumatic subarachnoid hemorrhage 12/21/2013  . Chronic anemia   . Chronic abdominal pain   . Renal disorder     Stage 3 kidney disease.  . CHF (congestive heart failure)     Past Surgical History  Procedure Laterality Date  . Breast surgery  12/16/2003  .  Aortic valve replacement  08/01/10    By Dr Arvid Right, St Jude AVR with 21 mm St Jude regent mechanical valve  . Colonoscopy  03/04/2003    Dr. Gala Romney- internal hemorrhoids o/w normal rectum, scattered L sided diveritula  .  Colonoscopy  04/12/2009    Dr. Imogene Burn polyp, L dided diverticula, tubular adenoma on bx  . Colonoscopy N/A 01/06/2013    RMR; multiple colonic/rectal polyps s/p polypectomy. Fragments of tubular adenomas, needs surveillance Oct 2014  . Colonoscopy N/A 01/18/2013    JGG:[id number] polypectomy site with bleeding, s/p bleeding control therapy  . Cardiac catheterization  05/2010    Normal coronary arteries angiographically, normal left ventricular functionultrasonographically and severe aortic stenosis by right cath. Optimal therapy would be elective aortic valve replacement  . Doppler echocardiography    . Stress myocardial perfusion    . Ultrasound guidance for vascular access      Medications:  HOME MEDS: Prior to Admission medications   Medication Sig Start Date End Date Taking? Authorizing Provider  acetaminophen (TYLENOL) 325 MG tablet Take 2 tablets (650 mg total) by mouth every 4 (four) hours as needed for headache. 12/22/13  Yes Lisette Abu, PA-C  cyanocobalamin (,VITAMIN B-12,) 1000 MCG/ML injection Inject 1,000 mcg into the muscle every 30 (thirty) days.   Yes Historical Provider, MD  Docusate Sodium (COLACE PO) Take 1 capsule by mouth as needed (constipation).    Yes Historical Provider, MD  escitalopram (LEXAPRO) 20 MG tablet Take 1 tablet by mouth daily. 12/23/14  Yes Historical Provider, MD  FIBER SELECT GUMMIES CHEW Chew 1 tablet by mouth 2 (two) times daily.   Yes Historical Provider, MD  fluticasone (FLONASE) 50 MCG/ACT nasal spray Place 2 sprays into the nose daily as needed for allergies or rhinitis.    Yes Historical Provider, MD  folic acid (FOLVITE) 1 MG tablet Take 1 mg by mouth daily.   Yes Historical Provider, MD  furosemide (LASIX) 40 MG tablet Take 1 tablet (40 mg total) by mouth daily at 3 pm. 05/18/14  Yes Belkys A Regalado, MD  furosemide (LASIX) 80 MG tablet Take 1 tablet (80 mg total) by mouth every morning. 05/18/14  Yes Belkys A Regalado, MD  hydrocortisone  2.5 % cream APPLY RECTALLY 3 TO 4 TIMES DAILY AS NEEDED. 09/05/14  Yes Estill Dooms, NP  Insulin Lispro Prot & Lispro (HUMALOG 75/25 MIX) (75-25) 100 UNIT/ML Kwikpen Inject 40 Units into the skin 2 (two) times daily.    Yes Historical Provider, MD  loratadine (CLARITIN) 10 MG tablet Take 10 mg by mouth daily as needed for allergies.    Yes Historical Provider, MD  LORazepam (ATIVAN) 0.5 MG tablet Take 1 tablet by mouth daily as needed. 11/12/14  Yes Historical Provider, MD  mupirocin cream (BACTROBAN) 2 % Apply 1 application topically daily as needed.   Yes Historical Provider, MD  warfarin (COUMADIN) 5 MG tablet Take 5-7.5 mg by mouth daily. 5 mg daily except 7.5 mg on Wednesday.   Yes Historical Provider, MD  DULoxetine (CYMBALTA) 30 MG capsule Take 30 mg by mouth daily.    Historical Provider, MD  warfarin (COUMADIN) 5 MG tablet TAKE ONE TO ONE AND ONE-HALF TABLET BY MOUTH EVERY DAY Patient not taking: Reported on 01/29/2015 08/22/14   Lorretta Harp, MD     Allergies:  Allergies  Allergen Reactions  . Demerol Other (See Comments)    Blood pressure goes up  . Meperidine Other (See Comments)  BP fluctuations  . Naprosyn [Naproxen] Swelling    Fluid retention Legs swelling    Social History:   reports that she has never smoked. She has never used smokeless tobacco. She reports that she does not drink alcohol or use illicit drugs.  Family History: Family History  Problem Relation Age of Onset  . Heart failure Mother   . CAD    . Diabetes    . Other Brother     knee problems  . Hypertension Brother   . Heart disease Maternal Grandmother      Physical Exam: Filed Vitals:   01/29/15 1600 01/29/15 1630 01/29/15 1700 01/29/15 1845  BP: 108/49 110/49 117/85   Pulse: 106 93 90 87  Temp:      TempSrc:      Resp: 17 23 23 23   Height:      Weight:      SpO2: 75% 95% 94% 96%   Blood pressure 117/85, pulse 87, temperature 98.1 F (36.7 C), temperature source Oral, resp.  rate 23, height 5' 3.5" (1.613 m), weight 115.214 kg (254 lb), SpO2 96 %.  GEN:  Pleasant patient lying in the stretcher in no acute distress; cooperative with exam. PSYCH:  alert and oriented x4; does not appear anxious or depressed; affect is appropriate. HEENT: Mucous membranes pink and anicteric; PERRLA; EOM intact; no cervical lymphadenopathy nor thyromegaly or carotid bruit; no JVD; There were no stridor. Neck is very supple. Breasts:: Not examined CHEST WALL: No tenderness CHEST: Normal respiration, basilar rales.  HEART: Regular rate and rhythm.  There are no murmur, rub, or gallops.   BACK: No kyphosis or scoliosis; no CVA tenderness ABDOMEN: soft and non-tender; no masses, no organomegaly, normal abdominal bowel sounds; no pannus; no intertriginous candida. There is no rebound and no distention. Rectal Exam: Not done EXTREMITIES: No bone or joint deformity; age-appropriate arthropathy of the hands and knees; no edema; no ulcerations.  There is no calf tenderness. Genitalia: not examined PULSES: 2+ and symmetric SKIN: Normal hydration no rash or ulceration CNS: Cranial nerves 2-12 grossly intact no focal lateralizing neurologic deficit.  Speech is fluent; uvula elevated with phonation, facial symmetry and tongue midline. DTR are normal bilaterally, cerebella exam is intact, barbinski is negative and strengths are equaled bilaterally.  No sensory loss.   Labs on Admission:  Basic Metabolic Panel:  Recent Labs Lab 01/29/15 1037  NA 139  K 4.3  CL 103  CO2 27  GLUCOSE 262*  BUN 52*  CREATININE 1.56*  CALCIUM 8.9   Liver Function Tests:  Recent Labs Lab 01/29/15 1037  AST 22  ALT 15  ALKPHOS 72  BILITOT 2.6*  PROT 7.5  ALBUMIN 4.0   No results for input(s): LIPASE, AMYLASE in the last 168 hours. No results for input(s): AMMONIA in the last 168 hours. CBC:  Recent Labs Lab 01/29/15 1037  WBC 8.6  NEUTROABS 4.7  HGB 11.6*  HCT 37.1  MCV 84.1  PLT 92*    Cardiac Enzymes:  Recent Labs Lab 01/29/15 1037  TROPONINI 0.03    CBG: No results for input(s): GLUCAP in the last 168 hours.   Radiological Exams on Admission: Dg Chest Portable 1 View  01/29/2015   CLINICAL DATA:  Shortness of breath and weakness  EXAM: PORTABLE CHEST - 1 VIEW  COMPARISON:  May 16, 2014  FINDINGS: New there is mild interstitial edema with cardiomegaly and pulmonary venous hypertension. Patient is status post median sternotomy. There is no airspace consolidation.  No adenopathy. Patient is status post aortic valve replacement.  IMPRESSION: Evidence a degree of congestive heart failure. No airspace consolidation.   Electronically Signed   By: Lowella Grip III M.D.   On: 01/29/2015 10:54    EKG: Independently reviewed.    Assessment/Plan Present on Admission:  . Hypoxia . Acute respiratory failure with hypoxia . Chronic anemia . Chronic kidney disease . Hypertension . Obesity . Sleep apnea  PLAN:  Will admit her for mild acute diastolic CHF. Will continue with IV lasix.  Check daily I/O and weights.  Follow her Cr carefully as she has CKD III.  For her DM, will lower her total daily insulin dose to Lantus 20 units BID, and do SSI.  For her anxiety, we discussed and will use Lexapro at 10 mg per day (isomer of Celexa), along with lower dose of Ativan (0.25mg  TID PRN.  I will give Lasix at 40 mg BID.  For her BP, along with hx of DM, and CKD, will give low dose of Cozaar.  She has coughs with ACE I.  For her AVR, will continue with coumadin as per pharmcy dosing.  Will obtain another ECHO, as her last one was over 6 months ago.  She is stable, full code, and will be admitted to [facility] service.  Thank you for allowing me to participate in her care.   Other plans as per orders.  Code Status: FULL Haskel Khan, MD. [facility] Pager [phone] 7pm to 7am.  01/29/2015, 7:48 PM

## 2015-01-30 ENCOUNTER — Encounter (HOSPITAL_COMMUNITY): Payer: Self-pay | Admitting: Cardiology

## 2015-01-30 DIAGNOSIS — I050 Rheumatic mitral stenosis: Secondary | ICD-10-CM

## 2015-01-30 DIAGNOSIS — N183 Chronic kidney disease, stage 3 unspecified: Secondary | ICD-10-CM

## 2015-01-30 DIAGNOSIS — E1129 Type 2 diabetes mellitus with other diabetic kidney complication: Secondary | ICD-10-CM

## 2015-01-30 DIAGNOSIS — N189 Chronic kidney disease, unspecified: Secondary | ICD-10-CM

## 2015-01-30 DIAGNOSIS — I5033 Acute on chronic diastolic (congestive) heart failure: Secondary | ICD-10-CM

## 2015-01-30 DIAGNOSIS — E1122 Type 2 diabetes mellitus with diabetic chronic kidney disease: Secondary | ICD-10-CM

## 2015-01-30 LAB — GLUCOSE, CAPILLARY
GLUCOSE-CAPILLARY: 263 mg/dL — AB (ref 70–99)
Glucose-Capillary: 108 mg/dL — ABNORMAL HIGH (ref 70–99)
Glucose-Capillary: 201 mg/dL — ABNORMAL HIGH (ref 70–99)
Glucose-Capillary: 303 mg/dL — ABNORMAL HIGH (ref 70–99)
Glucose-Capillary: 177 mg/dL — ABNORMAL HIGH (ref 70–99)
Glucose-Capillary: 190 mg/dL — ABNORMAL HIGH (ref 70–99)

## 2015-01-30 LAB — PROTIME-INR
INR: 2.86 — AB (ref 0.00–1.49)
PROTHROMBIN TIME: 30.2 s — AB (ref 11.6–15.2)

## 2015-01-30 LAB — BASIC METABOLIC PANEL
Anion gap: 8 (ref 5–15)
BUN: 56 mg/dL — ABNORMAL HIGH (ref 6–20)
CALCIUM: 8.9 mg/dL (ref 8.9–10.3)
CO2: 31 mmol/L (ref 22–32)
Chloride: 104 mmol/L (ref 101–111)
Creatinine, Ser: 1.52 mg/dL — ABNORMAL HIGH (ref 0.44–1.00)
GFR calc Af Amer: 39 mL/min — ABNORMAL LOW (ref >60)
GFR calc non Af Amer: 34 mL/min — ABNORMAL LOW (ref >60)
Glucose, Bld: 107 mg/dL — ABNORMAL HIGH (ref 70–99)
POTASSIUM: 4 mmol/L (ref 3.5–5.1)
SODIUM: 143 mmol/L (ref 135–145)

## 2015-01-30 MED ORDER — FUROSEMIDE 10 MG/ML IJ SOLN
80.0000 mg | Freq: Two times a day (BID) | INTRAMUSCULAR | Status: DC
  Administered 2015-01-30 – 2015-01-31 (×2): 80 mg via INTRAVENOUS
  Filled 2015-01-30 (×2): qty 8

## 2015-01-30 MED ORDER — INSULIN ASPART 100 UNIT/ML ~~LOC~~ SOLN
4.0000 [IU] | Freq: Three times a day (TID) | SUBCUTANEOUS | Status: DC
  Administered 2015-01-30 – 2015-01-31 (×2): 4 [IU] via SUBCUTANEOUS

## 2015-01-30 MED ORDER — WARFARIN SODIUM 5 MG PO TABS
5.0000 mg | ORAL_TABLET | Freq: Once | ORAL | Status: DC
  Filled 2015-01-30: qty 1

## 2015-01-30 MED ORDER — LOSARTAN POTASSIUM 50 MG PO TABS
25.0000 mg | ORAL_TABLET | Freq: Every day | ORAL | Status: DC
  Filled 2015-01-30 (×2): qty 1

## 2015-01-30 NOTE — Progress Notes (Signed)
PROGRESS NOTE  Hailey Curry WUX:[id number] DOB: Feb 07, 1946 DOA: 01/29/2015 PCP: Purvis Kilts, MD  Brief History 69 year old female with a history of CKD stationary, diabetes mellitus, diastolic CHF, St. Jude's aortic valve on chronic anticoagulation presents with 3 month history of shortness of breath and dyspnea on exertion. She states that in the past 3-4 days, she has noted increasing fatigue and dyspnea on exertion. She states that she weighs herself daily and has only gained 2 pounds in the past month. She endorses compliance with all her medications. She denies any fevers, chills, chest discomfort, nausea, vomiting, diarrhea patient has had some dizziness and lightheadedness.  Notably, the patient was started on Cymbalta roughly 1 week ago. She feels that she may have had an allergic reaction to Cymbalta resulting in her shortness of breath.  Assessment/Plan: Acute on chronic diastolic CHF -Patient appears clinically fluid overloaded; patient cannot lay flat -Chest x-ray shows increased interstitial markings when compared with prior chest x-rays -Increase furosemide 80 mg IV twice a day -The patient feels that her shortness of breath is related to her reaction to Cymbalta--> consult cardiology for opinion  -Daily weights  -I's and O's  -Continue losartan -?worsening mitral stenosis -echo Acute respiratory failure -Stable on 2 L -Will need to go home on supplemental oxygen -Patient desatted with ambulation Diabetes mellitus type 2  -Continue NovoLog sliding scale  -Add NovoLog 4 units with meals  -Hemoglobin A1c CKD stage III -Baseline creatinine 1.2-1.6 -Monitor closely with diuresis Status post St. Jude aortic valve  -Continue warfarin  -INR is therapeutic  Hypertension  -Continue losartan  Depression  -Restart Lexapro which was the patient's prior medication    Family Communication:   Husband updated at beside Disposition Plan:   Home when  medically stable       Procedures/Studies: Dg Chest Portable 1 View  01/29/2015   CLINICAL DATA:  Shortness of breath and weakness  EXAM: PORTABLE CHEST - 1 VIEW  COMPARISON:  May 16, 2014  FINDINGS: New there is mild interstitial edema with cardiomegaly and pulmonary venous hypertension. Patient is status post median sternotomy. There is no airspace consolidation. No adenopathy. Patient is status post aortic valve replacement.  IMPRESSION: Evidence a degree of congestive heart failure. No airspace consolidation.   Electronically Signed   By: Lowella Grip III M.D.   On: 01/29/2015 10:54         Subjective: Patient is being better but still having some dyspnea on exertion. Denies any chest pain, nausea, vomiting, diarrhea, abdominal pain, dysuria, hematuria.   Objective: Filed Vitals:   01/30/15 0432 01/30/15 0439 01/30/15 0747 01/30/15 1008  BP: 116/46   124/44  Pulse: 82   81  Temp: 98.2 F (36.8 C)     TempSrc: Oral     Resp: 20     Height:      Weight:  116.847 kg (257 lb 9.6 oz)    SpO2: 98%  98%     Intake/Output Summary (Last 24 hours) at 01/30/15 1319 Last data filed at 01/30/15 0445  Gross per 24 hour  Intake    480 ml  Output    550 ml  Net    -70 ml   Weight change:  Exam:   General:  Pt is alert, follows commands appropriately, not in acute distress  HEENT: No icterus, No thrush, No neck mass, [place]/AT  Cardiovascular: RRR, S1/S2, no rubs, no gallops  Respiratory: Bibasilar  crackles   Abdomen: Soft/+BS, non tender, non distended, no guarding  Extremities: 1+LE edema, No lymphangitis, No petechiae, No rashes, no synovitis  Data Reviewed: Basic Metabolic Panel:  Recent Labs Lab 01/29/15 1037 01/30/15 0519  NA 139 143  K 4.3 4.0  CL 103 104  CO2 27 31  GLUCOSE 262* 107*  BUN 52* 56*  CREATININE 1.56* 1.52*  CALCIUM 8.9 8.9   Liver Function Tests:  Recent Labs Lab 01/29/15 1037  AST 22  ALT 15  ALKPHOS 72  BILITOT 2.6*  PROT  7.5  ALBUMIN 4.0   No results for input(s): LIPASE, AMYLASE in the last 168 hours. No results for input(s): AMMONIA in the last 168 hours. CBC:  Recent Labs Lab 01/29/15 1037  WBC 8.6  NEUTROABS 4.7  HGB 11.6*  HCT 37.1  MCV 84.1  PLT 92*   Cardiac Enzymes:  Recent Labs Lab 01/29/15 1037  TROPONINI 0.03   BNP: Invalid input(s): POCBNP CBG:  Recent Labs Lab 01/29/15 2200 01/30/15 0027 01/30/15 0430 01/30/15 0855 01/30/15 1152  GLUCAP 260* 263* 108* 201* 303*    No results found for this or any previous visit (from the past 240 hour(s)).   Scheduled Meds: . docusate sodium  100 mg Oral BID  . escitalopram  10 mg Oral Daily  . fluticasone  2 spray Each Nare Daily  . folic acid  1 mg Oral Daily  . furosemide  80 mg Intravenous BID  . insulin aspart  0-15 Units Subcutaneous 6 times per day  . insulin glargine  20 Units Subcutaneous BID  . losartan  25 mg Oral Daily  . sodium chloride  3 mL Intravenous Q12H  . warfarin  5 mg Oral Once  . Warfarin - Pharmacist Dosing Inpatient   Does not apply Q24H   Continuous Infusions:    Osie Merkin, DO  [facility] Pager [phone]  If 7PM-7AM, please contact night-coverage [email or web address] Password [email or web address] 01/30/2015, 1:19 PM   LOS: 1 day

## 2015-01-30 NOTE — Care Management Note (Addendum)
Case Management Note  Patient Details  Name: Hailey Curry MRN: [id number] Date of Birth: [birth year]  Subjective/Objective:                    Action/Plan: Pt is from home, independent at baseline. Pt has walker for PRN use, pt has CPAP at home. Pt requiring O2. Home O2 arranged through [facility], per pt's choice. Port O2 tank is in pt's room at this time. No other CM needs anticipated. Will cont to follow.   Expected Discharge Date:  02/01/15               Expected Discharge Plan:  Home/Self Care  In-House Referral:  NA  Discharge planning Services  CM Consult  Post Acute Care Choice:  Durable Medical Equipment Choice offered to:  Patient  DME Arranged:  Oxygen DME Agency:  [facility]:    [facility] Agency:     Status of Service:  In process, will continue to follow  Medicare Important Message Given:    Date Medicare IM Given:    Medicare IM give by:    Date Additional Medicare IM Given:    Additional Medicare Important Message give by:     If discussed at [place] of Stay Meetings, dates discussed:    Additional Comments:  Sherald Barge, RN 01/30/2015, 1:56 PM

## 2015-01-30 NOTE — Evaluation (Signed)
Physical Therapy Evaluation Patient Details Name: Hailey Curry MRN: [id number] DOB: 24-Oct-1945 Today's Date: 01/30/2015   History of Present Illness  Hailey Curry is an 69 y.o. female with hx of DM (40 units Mixtard BID), hx of CKD stage III, breast cancer, HTN, s/p mechanical AVR on chronic anticoagulation, hx of OSA, hx of traumatic SAH, hx of anxiety recently placed on Cymbalta, prior on Lexapro, presented to the ER with DOE, SOB, and found to desat in the ER. She was found to have elevated BNP of 400's, negative D dimer and troponin, and CXR showed CHF. Her Cr was 1.5, and her WBC and Hb was stable. She normally takes 120mg  oral Lasix, and had been given IV Lasix in the ER with improvement of her symptoms. Her previous ECHO showed EF of 55%. EKG showed Bifascicular blocks, unchanged from previous EKG. Hospitalist was asked to admit her for CHF. She is totally convinced she had an allergic Rxn to Cymbalta.   Clinical Impression  Pt states that he MD wants her to walk at home but she gets so SOB she can not complete this activity.  Therapist explained to pt that she can start with small bouts such as walking a minute to two minutes every hour and once this is easy increase by 30 seconds until she is walking for five minutes.  At 5 minutes pt can walk three times a day.  Pt verbalized that she felt that she could do this.  Pt refused to ambulate with therapist after exercising secondary to becoming dizzy with sit to stand.  Pt stated that she would walk with there husband tonight.  Explained that pt should stand for one minute prior to trying to ambulate to make sure her SOB was under control as well as not becoming light headed.  Pt verbalized understanding .     Follow Up Recommendations No PT follow up (Pt does not want HH)    Equipment Recommendations  None recommended by PT    Recommendations for Other Services       Precautions / Restrictions Precautions Precautions:  None Restrictions Weight Bearing Restrictions: No      Mobility  Bed Mobility Overal bed mobility: Modified Independent                Transfers Overall transfer level: Needs assistance Equipment used: 1 person hand held assist Transfers: Sit to/from Stand Sit to Stand: Modified independent (Device/Increase time)         General transfer comment: Pt became light headed with standing; light headedness disappated once pt sat down.   Ambulation/Gait  NT -pt refused                        Pertinent Vitals/Pain Pain Assessment: No/denies pain    Home Living Family/patient expects to be discharged to:: Private residence Living Arrangements: Spouse/significant other Available Help at Discharge: Family   Home Access: Stairs to enter Entrance Stairs-Rails: Psychiatric nurse of Steps: 2 Home Layout: One level Home Equipment: Environmental consultant - 2 wheels;Bedside commode;Shower seat Additional Comments: Pt now uses a rollalator at home     Prior Function Level of Independence: Needs assistance   Gait / Transfers Assistance Needed: I with assistive device for short distances  ADL's / Homemaking Assistance Needed: husband         Hand Dominance   Dominant Hand: Right    Extremity/Trunk Assessment  Lower Extremity Assessment: Generalized weakness         Communication    wnl  Cognition Arousal/Alertness: Awake/alert   Overall Cognitive Status: Within Functional Limits for tasks assessed                      General Comments      Exercises General Exercises - Lower Extremity Ankle Circles/Pumps: Seated;Both;10 reps Gluteal Sets: Seated;Both;10 reps Long Arc Quad: Seated;Both;10 reps Hip ABduction/ADduction: Seated;Both;10 reps Hip Flexion/Marching: Seated;Both;10 reps      Assessment/Plan    PT Assessment Patient needs continued PT services  PT Diagnosis Difficulty walking;Generalized weakness   PT  Problem List Decreased strength;Decreased activity tolerance;Decreased mobility  PT Treatment Interventions Gait training;Therapeutic activities;Therapeutic exercise   PT Goals (Current goals can be found in the Care Plan section) Acute Rehab PT Goals PT Goal Formulation: With patient Potential to Achieve Goals: Good    Frequency Min 3X/week   Barriers to discharge           End of Session   Activity Tolerance: Patient limited by fatigue Patient left: in chair;with call bell/phone within reach           Time: 1535-1605 PT Time Calculation (min) (ACUTE ONLY): 30 min   Charges:   PT Evaluation $Initial PT Evaluation Tier I: 1 Procedure     PT G CodesRayetta Humphrey, PT CLT [phone] 01/30/2015, 4:08 PM

## 2015-01-30 NOTE — Progress Notes (Signed)
SATURATION QUALIFICATIONS: (This note is used to comply with regulatory documentation for home oxygen)  Patient Saturations on Room Air at Rest = 86%  Patient Saturations on Room Air while Ambulating = 81%  Patient Saturations on 2 Liters of oxygen while Ambulating = 93%  Please briefly explain why patient needs home oxygen: Patient was SOB with the exertion.  Patient only walked less than 10 feet before she became symptomatic.

## 2015-01-30 NOTE — Consult Note (Signed)
.   Reason for Consult: CHF Referring Physician: PTH Primary care: Dr. [name] Primary cardiologist: Dr. Quay Burow  Hailey Curry is an 69 y.o. female.patient of Dr. Quay Burow who was admitted yesterday with acute on chronic diastolic heart failure. She has history of St. Jude aortic valve replacement in 2011 on chronic anticoagulation, normal cardiac catheterization and LV function at that time, perioperative atrial fibrillation, chronic kidney disease stage III followed by Dr. [name]. She did suffer a subarachnoid hematoma and was off Coumadin for a month March 2015. She takes Lasix 120 mg daily for her diastolic heart failure. She has sleep apnea but is intolerant of CPAP, hyperlipidemia intolerant to statins, and diabetes mellitus and hypertension. Last saw Dr. Gwenlyn Found 11/11/14. Last 2-D echo May 2015 is noted below.  Patient was started on Cymbalta 2 weeks ago for neuropathy by her report. Last Thursday she went out to eat pizza and salad and that evening started to have increased dyspnea. Her O2 sats began to drop and it progressively worsened over the weekend. Her O2 sats dropped to 72% last night when she came to the ER. She said her leg edema has not changed significantly, although her weight did go up about 4 pounds. She states that it usually varies by 2-3 pounds. Blood sugars have also been increasing, her husband tracts this closely. They think this may be related to the Cymbalta, she has actually been off medication since Friday under the direction of Dr. [name]. She usually sleeps upright with her legs dangling. She is scheduled for her yearly 2-D echo at Dr. Kennon Holter office next Thursday. BNP 484, d-dimer and troponin negative, chest x-ray consistent with heart failure. EKG sinus tachycardia at 102 bpm with right bundle branch block, no acute change.    Past Medical History  Diagnosis Date  . Type 2 diabetes mellitus   . Essential hypertension   . Hyperlipidemia   .  Breast cancer 11/2003  . Colonic adenoma   . OSA (obstructive sleep apnea)     AHI 45.0/hr, REM sleep was 231 mins.sleep was 77% and 84%, RDI was 21.5/hr total sleep was 2h 58 mins  . Aortic stenosis     Status post 21 mm St. Jude Regent mechanical valve 2011   . Scalp laceration   . Traumatic subarachnoid hemorrhage 12/21/2013  . Chronic anemia   . Chronic abdominal pain   . CKD (chronic kidney disease) stage 3, GFR 30-59 ml/min   . Diastolic heart failure     Past Surgical History  Procedure Laterality Date  . Breast surgery  12/16/2003  . Aortic valve replacement  08/01/10    Dr Arvid Right, St Jude AVR with 21 mm St Jude regent mechanical valve  . Colonoscopy  03/04/2003    Dr. Gala Romney- internal hemorrhoids o/w normal rectum, scattered L sided diveritula  . Colonoscopy  04/12/2009    Dr. Imogene Burn polyp, L dided diverticula, tubular adenoma on bx  . Colonoscopy N/A 01/06/2013    RMR; multiple colonic/rectal polyps s/p polypectomy. Fragments of tubular adenomas, needs surveillance Oct 2014  . Colonoscopy N/A 01/18/2013    FBP:ZWCHEN polypectomy site with bleeding, s/p bleeding control therapy  . Cardiac catheterization  05/2010    Normal coronary arteries angiographically, normal left ventricular functionultrasonographically and severe aortic stenosis by right cath. Optimal therapy would be elective aortic valve replacement  . Doppler echocardiography    . Stress myocardial perfusion    . Ultrasound guidance for vascular access  Family History  Problem Relation Age of Onset  . Heart failure Mother   . CAD    . Diabetes    . Other Brother     Knee problems  . Hypertension Brother   . Heart disease Maternal Grandmother     Social History:  reports that she has never smoked. She has never used smokeless tobacco. She reports that she does not drink alcohol or use illicit drugs.  Allergies:  Allergies  Allergen Reactions  . Demerol Other (See Comments)    Blood  pressure goes up  . Meperidine Other (See Comments)    BP fluctuations  . Naprosyn [Naproxen] Swelling    Fluid retention Legs swelling    Medications: Scheduled Meds: . docusate sodium  100 mg Oral BID  . escitalopram  10 mg Oral Daily  . fluticasone  2 spray Each Nare Daily  . folic acid  1 mg Oral Daily  . furosemide  80 mg Intravenous BID  . insulin aspart  0-15 Units Subcutaneous 6 times per day  . insulin aspart  4 Units Subcutaneous TID WC  . insulin glargine  20 Units Subcutaneous BID  . losartan  25 mg Oral Daily  . sodium chloride  3 mL Intravenous Q12H  . warfarin  5 mg Oral Once  . Warfarin - Pharmacist Dosing Inpatient   Does not apply Q24H   Continuous Infusions:  PRN Meds:.acetaminophen, diphenhydrAMINE, loratadine, LORazepam   Results for orders placed or performed during the hospital encounter of 01/29/15 (from the past 48 hour(s))  Troponin I     Status: None   Collection Time: 01/29/15 10:37 AM  Result Value Ref Range   Troponin I 0.03 <0.031 ng/mL    Comment:        NO INDICATION OF MYOCARDIAL INJURY.   CBC with Differential     Status: Abnormal   Collection Time: 01/29/15 10:37 AM  Result Value Ref Range   WBC 8.6 4.0 - 10.5 K/uL   RBC 4.41 3.87 - 5.11 MIL/uL   Hemoglobin 11.6 (L) 12.0 - 15.0 g/dL   HCT 37.1 36.0 - 46.0 %   MCV 84.1 78.0 - 100.0 fL   MCH 26.3 26.0 - 34.0 pg   MCHC 31.3 30.0 - 36.0 g/dL   RDW 14.3 11.5 - 15.5 %   Platelets 92 (L) 150 - 400 K/uL    Comment: SPECIMEN CHECKED FOR CLOTS PLATELET COUNT CONFIRMED BY SMEAR LARGE PLATELETS PRESENT    Neutrophils Relative % 54 43 - 77 %   Neutro Abs 4.7 1.7 - 7.7 K/uL   Lymphocytes Relative 31 12 - 46 %   Lymphs Abs 2.7 0.7 - 4.0 K/uL   Monocytes Relative 13 (H) 3 - 12 %   Monocytes Absolute 1.1 (H) 0.1 - 1.0 K/uL   Eosinophils Relative 1 0 - 5 %   Eosinophils Absolute 0.1 0.0 - 0.7 K/uL   Basophils Relative 0 0 - 1 %   Basophils Absolute 0.0 0.0 - 0.1 K/uL  Comprehensive  metabolic panel     Status: Abnormal   Collection Time: 01/29/15 10:37 AM  Result Value Ref Range   Sodium 139 135 - 145 mmol/L   Potassium 4.3 3.5 - 5.1 mmol/L   Chloride 103 101 - 111 mmol/L   CO2 27 22 - 32 mmol/L   Glucose, Bld 262 (H) 70 - 99 mg/dL   BUN 52 (H) 6 - 20 mg/dL   Creatinine, Ser 1.56 (H) 0.44 - 1.00  mg/dL   Calcium 8.9 8.9 - 10.3 mg/dL   Total Protein 7.5 6.5 - 8.1 g/dL   Albumin 4.0 3.5 - 5.0 g/dL   AST 22 15 - 41 U/L   ALT 15 14 - 54 U/L   Alkaline Phosphatase 72 38 - 126 U/L   Total Bilirubin 2.6 (H) 0.3 - 1.2 mg/dL   GFR calc non Af Amer 33 (L) >60 mL/min   GFR calc Af Amer 38 (L) >60 mL/min    Comment: (NOTE) The eGFR has been calculated using the CKD EPI equation. This calculation has not been validated in all clinical situations. eGFR's persistently <90 mL/min signify possible Chronic Kidney Disease.    Anion gap 9 5 - 15  D-dimer, quantitative     Status: None   Collection Time: 01/29/15 10:37 AM  Result Value Ref Range   D-Dimer, Quant <0.27 0.00 - 0.48 ug/mL-FEU    Comment:        AT THE INHOUSE ESTABLISHED CUTOFF VALUE OF 0.48 ug/mL FEU, THIS ASSAY HAS BEEN DOCUMENTED IN THE LITERATURE TO HAVE A SENSITIVITY AND NEGATIVE PREDICTIVE VALUE OF AT LEAST 98 TO 99%.  THE TEST RESULT SHOULD BE CORRELATED WITH AN ASSESSMENT OF THE CLINICAL PROBABILITY OF DVT / VTE.   TSH     Status: None   Collection Time: 01/29/15 10:37 AM  Result Value Ref Range   TSH 1.528 0.350 - 4.500 uIU/mL  Brain natriuretic peptide     Status: Abnormal   Collection Time: 01/29/15 10:39 AM  Result Value Ref Range   B Natriuretic Peptide 484.0 (H) 0.0 - 100.0 pg/mL  Protime-INR     Status: Abnormal   Collection Time: 01/29/15 12:28 PM  Result Value Ref Range   Prothrombin Time 31.1 (H) 11.6 - 15.2 seconds   INR 2.97 (H) 0.00 - 1.49  Glucose, capillary     Status: Abnormal   Collection Time: 01/29/15 10:00 PM  Result Value Ref Range   Glucose-Capillary 260 (H) 70 - 99  mg/dL   Comment 1 Notify RN    Comment 2 Document in Chart   Glucose, capillary     Status: Abnormal   Collection Time: 01/30/15 12:27 AM  Result Value Ref Range   Glucose-Capillary 263 (H) 70 - 99 mg/dL   Comment 1 Notify RN    Comment 2 Document in Chart   Glucose, capillary     Status: Abnormal   Collection Time: 01/30/15  4:30 AM  Result Value Ref Range   Glucose-Capillary 108 (H) 70 - 99 mg/dL   Comment 1 Notify RN    Comment 2 Document in Chart   Basic metabolic panel     Status: Abnormal   Collection Time: 01/30/15  5:19 AM  Result Value Ref Range   Sodium 143 135 - 145 mmol/L   Potassium 4.0 3.5 - 5.1 mmol/L   Chloride 104 101 - 111 mmol/L   CO2 31 22 - 32 mmol/L   Glucose, Bld 107 (H) 70 - 99 mg/dL   BUN 56 (H) 6 - 20 mg/dL   Creatinine, Ser 1.52 (H) 0.44 - 1.00 mg/dL   Calcium 8.9 8.9 - 10.3 mg/dL   GFR calc non Af Amer 34 (L) >60 mL/min   GFR calc Af Amer 39 (L) >60 mL/min    Comment: (NOTE) The eGFR has been calculated using the CKD EPI equation. This calculation has not been validated in all clinical situations. eGFR's persistently <90 mL/min signify possible Chronic Kidney Disease.  Anion gap 8 5 - 15  Protime-INR     Status: Abnormal   Collection Time: 01/30/15  5:19 AM  Result Value Ref Range   Prothrombin Time 30.2 (H) 11.6 - 15.2 seconds   INR 2.86 (H) 0.00 - 1.49  Glucose, capillary     Status: Abnormal   Collection Time: 01/30/15  8:55 AM  Result Value Ref Range   Glucose-Capillary 201 (H) 70 - 99 mg/dL   Comment 1 Notify RN   Glucose, capillary     Status: Abnormal   Collection Time: 01/30/15 11:52 AM  Result Value Ref Range   Glucose-Capillary 303 (H) 70 - 99 mg/dL   Comment 1 Notify RN     Dg Chest Portable 1 View  01/29/2015   CLINICAL DATA:  Shortness of breath and weakness  EXAM: PORTABLE CHEST - 1 VIEW  COMPARISON:  May 16, 2014  FINDINGS: New there is mild interstitial edema with cardiomegaly and pulmonary venous hypertension.  Patient is status post median sternotomy. There is no airspace consolidation. No adenopathy. Patient is status post aortic valve replacement.  IMPRESSION: Evidence a degree of congestive heart failure. No airspace consolidation.   Electronically Signed   By: Lowella Grip III M.D.   On: 01/29/2015 10:54    Echocardiogram 02/16/2014: Study Conclusions  - Left ventricle: The cavity size was normal. There was moderate concentric hypertrophy. Systolic function was vigorous. The estimated ejection fraction was in the range of 65% to 70%. Wall motion was normal; there were no regional wall motion abnormalities. Doppler parameters are consistent with abnormal left ventricular relaxation (grade 1 diastolic dysfunction). - Aortic valve: St. Jude mechanical aortic valve. Normal bileaflet motion. Peak and mean gradients of 28 and 18 mmHg. There is trivial regurgitation. Valve area (VTI): 2.11 cm^2. Valve area (Vmax): 2.04 cm^2. - Mitral valve: Calcified annulus. Mildly thickened leaflets . There is mild to moderate mitral stenosis. Peak and mean gradients are 15 mmHg and 10 mmHg, respectively. The P1/2t is 103 msec. There was mild regurgitation. Valve area by pressure half-time: 2.14 cm^2. Valve area by continuity equation (using LVOT flow): 1.8 cm^2. - Left atrium: LA Volume/BSA= 19.5 ml/m2 The atrium was normal in size. - Right ventricle: The cavity size was moderately dilated. - Right atrium: The atrium was mildly dilated.  Impressions:  - Compared to the prior study in 04/2013, the aortic valve gradient has not changed. There is now moderate mitral stenosis.   ROS  See HPI Eyes: Negative Ears:Negative for hearing loss, tinnitus Cardiovascular: Negative for chest pain, palpitations,irregular heartbeat,  near-syncope, orthopnea,  and syncope claudication, cyanosis,.  Respiratory:   Negative for cough, hemoptysis,  sputum production and wheezing.    Endocrine: Negative for cold intolerance and heat intolerance.  Hematologic/Lymphatic: Negative for adenopathy and bleeding problem. Does  bruise/bleed easily on Coumadin.  Musculoskeletal: Negative.   Gastrointestinal: Negative for nausea, vomiting, reflux, abdominal pain, diarrhea, constipation.   Genitourinary: Negative for bladder incontinence, dysuria, flank pain, frequency, hematuria, hesitancy, nocturia and urgency.  Neurological: Tearful and anxious  Allergic/Immunologic: Negative for environmental allergies.  Blood pressure 122/51, pulse 81, temperature 98.1 F (36.7 C), temperature source Oral, resp. rate 18, height 5' 3.5" (1.613 m), weight 257 lb 9.6 oz (116.847 kg), SpO2 94 %. Physical Exam PHYSICAL EXAM: Obese, in no acute distress. Neck: No JVD, HJR, Bruit, or thyroid enlargement Lungs: Decreased breath sounds throughout with few crackles at the bases  Cardiovascular: RRR with crisp valvular clicks, PMI not displaced,  no murmurs  appreciated, gallops, bruit, thrill, or heave. Abdomen: BS normal. Soft without organomegaly, masses, lesions or tenderness. Extremities: +2 edema bilaterally up to her knees. Decreased distal pulses bilateral SKin: Warm, no lesions or rashes  Musculoskeletal: No deformities Neuro: no focal signs    Assessment/Plan: Acute on chronic diastolic heart failure: Most likely secondary to dietary indiscretion. Agree with IV diuresis-on Lasix 80 mg IV twice a day(usually takes 120 mg by mouth daily at home). So far she has diuresed 900 cc overnight .Patient would like to be managed at home so hopefully she can be discharged tomorrow.She is scheduled for outpatient echo next Thursday with Dr. Gwenlyn Found, does not need repeat study now. History of moderate MS but difficult to appreciate on exam secondary to body habitus.  St. Jude aVR in 2011 on chronic Coumadin  Chronic kidney disease stage III followed by Dr. [name]: Creatinine stable at 1.56  History of  moderate MS with normal LV function on 2-D echo 01/2014. Follow-up 2-D echo  Hypertension controlled  History of subarachnoid hematoma after a fall requiring Coumadin to be held for one month in March 2015  Obstructive sleep apnea  History of anxiety recently placed on Cymbalta  History of normal coronary arteries and LV function on cath in 2011  Diabetes mellitus  Ermalinda Barrios PA-C 01/30/2015, 3:20 PM    Attending note:  Patient seen and examined. Reviewed records and discussed the case with Ms. Bonnell Public PA-C. I agree with her above assessment. Ms. Rockholt is followed by Dr. Gwenlyn Found status post [facility]. Jude mechanical AVR in 2011, moderate mitral stenosis by echocardiogram in May 3361, chronic diastolic heart failure, and stage III chronic kidney disease. She presented to the hospital with shortness of breath, relative weight gain, also reporting recent  increased blood sugars over baseline after starting Cymbalta, generally not feeling well. She is being managed for acute on chronic diastolic heart failure, did have some dietary indiscretion this past Thursday as described above, but otherwise reports routine use of home Lasix at 120 mg daily without change. She is diuresing well on IV Lasix 80 mg twice daily. Symptomatically improving. Lungs are without rales, cardiac exam exhibits RRR with mechanical click in S2 and 2/6 systolic murmur, cannot appreciate diastolic murmur. Presenting chest x-ray did show mild interstitial edema. She has chronic appearing leg edema on examination as well. Would recommend continuing IV diuresis until tomorrow, if she continues to feel better, can likely be discharged on previous home Lasix dose, and would plan to follow-up for echocardiogram with Dr. Gwenlyn Found as already scheduled for next week.  Satira Sark, M.D., F.A.C.C.

## 2015-01-30 NOTE — Progress Notes (Signed)
[facility] for Warfarin Indication: AVR  Allergies  Allergen Reactions  . Demerol Other (See Comments)    Blood pressure goes up  . Meperidine Other (See Comments)    BP fluctuations  . Naprosyn [Naproxen] Swelling    Fluid retention Legs swelling    Patient Measurements: Height: 5' 3.5" (161.3 cm) Weight: 257 lb 9.6 oz (116.847 kg) IBW/kg (Calculated) : 53.55   Vital Signs: Temp: 98.2 F (36.8 C) (05/02 0432) Temp Source: Oral (05/02 0432) BP: 116/46 mmHg (05/02 0432) Pulse Rate: 82 (05/02 0432)  Labs:  Recent Labs  01/29/15 1037 01/29/15 1228 01/30/15 0519  HGB 11.6*  --   --   HCT 37.1  --   --   PLT 92*  --   --   LABPROT  --  31.1* 30.2*  INR  --  2.97* 2.86*  CREATININE 1.56*  --  1.52*  TROPONINI 0.03  --   --    Estimated Creatinine Clearance: 42.9 mL/min (by C-G formula based on Cr of 1.52).  Medical History: Past Medical History  Diagnosis Date  . Diabetes mellitus   . Hypertension   . High cholesterol   . Breast cancer 11/2003  . Colonic adenoma   . OSA (obstructive sleep apnea)     AHI 45.0/hr, REM sleep was 231 mins.sleep was 77% and 84%, RDI was 21.5/hr total sleep was 2h 58 mins  . Hx of echocardiogram 04/28/2012    EF>55%, Moderate to severe mitral annular calcification, mild mitral regurgitation, mild mitral stenosis, mild tricuspid regurgitation, the prosthetic aortic valve is not well visualized, 21 mm St Jude Regent michanical valve there is aortic root sclerosis/calcification. 65mmhg midcavity gradient.  . History of stress test 04/2008    Normal myocardial perfusion scan demonstrating an attenuationartifact in the anterior region of the myocardium. No ischemia or infarct/scar is seen in the remaining myocardium. compared to the previous study, there is no significant change. No significant ischemia demonstrated. this is a low risk study.  . Aortic stenosis   . H/O aortic valve replacement   . Scalp  laceration 12/21/2013  . Traumatic subarachnoid hemorrhage 12/21/2013  . Chronic anemia   . Chronic abdominal pain   . Renal disorder     Stage 3 kidney disease.  . CHF (congestive heart failure)    Medications:  Prescriptions prior to admission  Medication Sig Dispense Refill Last Dose  . acetaminophen (TYLENOL) 325 MG tablet Take 2 tablets (650 mg total) by mouth every 4 (four) hours as needed for headache.   01/28/2015 at Unknown time  . cyanocobalamin (,VITAMIN B-12,) 1000 MCG/ML injection Inject 1,000 mcg into the muscle every 30 (thirty) days.   Past Month at Unknown time  . Docusate Sodium (COLACE PO) Take 1 capsule by mouth as needed (constipation).    unknown  . escitalopram (LEXAPRO) 20 MG tablet Take 1 tablet by mouth daily.   01/28/2015 at Unknown time  . FIBER SELECT GUMMIES CHEW Chew 1 tablet by mouth 2 (two) times daily.   01/28/2015 at Unknown time  . fluticasone (FLONASE) 50 MCG/ACT nasal spray Place 2 sprays into the nose daily as needed for allergies or rhinitis.    unknown  . folic acid (FOLVITE) 1 MG tablet Take 1 mg by mouth daily.   01/28/2015 at Unknown time  . furosemide (LASIX) 40 MG tablet Take 1 tablet (40 mg total) by mouth daily at 3 pm. 30 tablet 0 01/28/2015 at Unknown time  .  furosemide (LASIX) 80 MG tablet Take 1 tablet (80 mg total) by mouth every morning. 30 tablet 0 01/28/2015 at Unknown time  . hydrocortisone 2.5 % cream APPLY RECTALLY 3 TO 4 TIMES DAILY AS NEEDED. 30 g 2 unknown  . Insulin Lispro Prot & Lispro (HUMALOG 75/25 MIX) (75-25) 100 UNIT/ML Kwikpen Inject 40 Units into the skin 2 (two) times daily.    01/28/2015 at Unknown time  . loratadine (CLARITIN) 10 MG tablet Take 10 mg by mouth daily as needed for allergies.    unknown at Unknown time  . LORazepam (ATIVAN) 0.5 MG tablet Take 1 tablet by mouth daily as needed.   unknown  . mupirocin cream (BACTROBAN) 2 % Apply 1 application topically daily as needed.   Past Week at Unknown time  . warfarin  (COUMADIN) 5 MG tablet Take 5-7.5 mg by mouth daily. 5 mg daily except 7.5 mg on Wednesday.   01/28/2015 at Unknown time  . DULoxetine (CYMBALTA) 30 MG capsule Take 30 mg by mouth daily.   01/27/2015  . warfarin (COUMADIN) 5 MG tablet TAKE ONE TO ONE AND ONE-HALF TABLET BY MOUTH EVERY DAY (Patient not taking: Reported on 01/29/2015) 40 tablet 3 Taking    Assessment: 69 yo F on chronic Coumadin for hx AVR.  Home dose listed above.  INR @ goal on admission.   Admitted for CHF, no bleeding noted.  Goal of Therapy:  INR 2.5-3.5   Plan:  Warfarin 5mg  PO x 1 per home regimen Daily PT/INR  Biagio Borg 01/30/2015,9:03 AM

## 2015-01-31 LAB — BASIC METABOLIC PANEL
ANION GAP: 11 (ref 5–15)
BUN: 68 mg/dL — ABNORMAL HIGH (ref 6–20)
CALCIUM: 8.7 mg/dL — AB (ref 8.9–10.3)
CO2: 29 mmol/L (ref 22–32)
Chloride: 101 mmol/L (ref 101–111)
Creatinine, Ser: 1.62 mg/dL — ABNORMAL HIGH (ref 0.44–1.00)
GFR calc Af Amer: 36 mL/min — ABNORMAL LOW (ref >60)
GFR, EST NON AFRICAN AMERICAN: 31 mL/min — AB (ref >60)
Glucose, Bld: 171 mg/dL — ABNORMAL HIGH (ref 70–99)
Potassium: 3.9 mmol/L (ref 3.5–5.1)
Sodium: 141 mmol/L (ref 135–145)

## 2015-01-31 LAB — PROTIME-INR
INR: 2.95 — ABNORMAL HIGH (ref 0.00–1.49)
Prothrombin Time: 30.9 seconds — ABNORMAL HIGH (ref 11.6–15.2)

## 2015-01-31 LAB — GLUCOSE, CAPILLARY
GLUCOSE-CAPILLARY: 163 mg/dL — AB (ref 70–99)
GLUCOSE-CAPILLARY: 196 mg/dL — AB (ref 70–99)
Glucose-Capillary: 250 mg/dL — ABNORMAL HIGH (ref 70–99)

## 2015-01-31 LAB — HEMOGLOBIN A1C
Hgb A1c MFr Bld: 7.2 % — ABNORMAL HIGH (ref 4.8–5.6)
MEAN PLASMA GLUCOSE: 160 mg/dL

## 2015-01-31 NOTE — Care Management Note (Signed)
Case Management Note  Patient Details  Name: [name] MRN: [id number] Date of Birth: [birth year]  Subjective/Objective:                    Action/Plan:   Expected Discharge Date:  02/01/15               Expected Discharge Plan:  Home/Self Care  In-House Referral:  NA  Discharge planning Services  CM Consult  Post Acute Care Choice:  Durable Medical Equipment Choice offered to:  Patient  DME Arranged:  Oxygen DME Agency:  [place]:    [facility] Agency:     Status of Service:  In process, will continue to follow  Medicare Important Message Given:  N/A - LOS <3 / Initial given by admissions Date Medicare IM Given:    Medicare IM give by:    Date Additional Medicare IM Given:    Additional Medicare Important Message give by:     If discussed at [place] of Stay Meetings, dates discussed:    Additional Comments: Pt discharged home today. HOme O2 arranged with [facility] and is in place. Pt has portable in the room. Husband and CM calling [facility] to make them aware of pt discharge so rest of O2 equipment can be delivered. No other CM needs noted. Pt and pts nurse aware of discharge arrangements. Christinia Gully [place], RN 01/31/2015, 10:18 AM

## 2015-01-31 NOTE — Progress Notes (Signed)
Inpatient Diabetes Program Recommendations  AACE/ADA: New Consensus Statement on Inpatient Glycemic Control (2013)  Target Ranges:  Prepandial:   less than 140 mg/dL      Peak postprandial:   less than 180 mg/dL (1-2 hours)      Critically ill patients:  140 - 180 mg/dL   Results for Hailey Curry, Hailey Curry (MRN [id number]) as of 01/31/2015 07:40  Ref. Range 01/30/2015 08:55 01/30/2015 11:52 01/30/2015 16:31 01/30/2015 20:15 01/31/2015 00:06 01/31/2015 04:17  Glucose-Capillary Latest Ref Range: 70-99 mg/dL 201 (H) 303 (H) 177 (H) 190 (H) 250 (H) 163 (H)   Diabetes history: DM2 Outpatient Diabetes medications: Humalog 75/25 40 units BID Current orders for Inpatient glycemic control: Lantus 20 units BID, Novolog 0-15 units Q4H, Novolog 4 units TID with meals  Inpatient Diabetes Program Recommendations Insulin - Basal: Please increase Lantus to 25 units BID.  Correction (SSI): If patient is eating and tolerating diet, please consider changing frequency of CBGs and Novolog to ACHS.  Thanks, Barnie Alderman, RN, MSN, CCRN, CDE Diabetes Coordinator Inpatient Diabetes Program [phone] (Team Pager from [place] to [place]) [phone] ([facility] office) [phone] [facility] office)

## 2015-01-31 NOTE — Discharge Summary (Addendum)
Physician Discharge Summary  Hailey Curry EXN:[id number] DOB: January 20, 1946 DOA: 01/29/2015  PCP: Purvis Kilts, MD  Admit date: 01/29/2015 Discharge date: 01/31/2015  Recommendations for Outpatient Follow-up:  1. Pt will need to follow up with PCP in 2 weeks post discharge 2. Please obtain BMP in one week 3. Please check INR on 02/03/15 and adjust warfarin for INR 2-3  Discharge Diagnoses:  Acute on chronic diastolic CHF -Patient appears clinically fluid overloaded; patient cannot lay flat -Chest x-ray shows increased interstitial markings when compared with prior chest x-rays -Increase furosemide 80 mg IV twice a day--good clinical response -Patient will go home with her previous home dose of furosemide 80 mg in morning, 40 mg afternoon -The patient feels that her shortness of breath is related to her reaction to Cymbalta--> consult cardiology for opinion  -cardiology agreed with present management and recommended to continue present medications -I/O--question accuracy -?worsening mitral stenosis -echo--will be done at her f/u appointment with Dr. Gwenlyn Found in one week Acute respiratory failure -Stable on 2 L -Will need to go home on supplemental oxygen -Patient desatted with ambulation Diabetes mellitus type 2  -Continue NovoLog sliding scale  -Add NovoLog 4 units with meals during hospitalization -Hemoglobin A1c--7.2 -home with her usual regimen of 75/25--40 units bid CKD stage III -Baseline creatinine 1.2-1.6 -Monitor closely with diuresis -Serum creatinine 1.62 on day of discharge Status post St. Jude aortic valve  -Continue warfarin  -INR is therapeutic  -Patient will go home on her previous home dose of warfarin--INR remained therapeutic throughout the hospitalization  Depression  -Restart Lexapro which was the patient's prior medication -follow up with PCP regarding her depression regimen -remained off of cymbalta during hospitalization  Discharge Condition:  stable  Disposition: home  Diet:heart healthy Wt Readings from Last 3 Encounters:  01/31/15 116.121 kg (256 lb)  11/11/14 118.117 kg (260 lb 6.4 oz)  05/31/14 115.849 kg (255 lb 6.4 oz)    History of present illness:  69 year old female with a history of CKD stationary, diabetes mellitus, diastolic CHF, St. Jude's aortic valve on chronic anticoagulation presents with 3 month history of shortness of breath and dyspnea on exertion. She states that in the past 3-4 days, she has noted increasing fatigue and dyspnea on exertion. She states that she weighs herself daily and has only gained 2 pounds in the past month. She endorses compliance with all her medications. She denies any fevers, chills, chest discomfort, nausea, vomiting, diarrhea patient has had some dizziness and lightheadedness. Notably, the patient was started on Cymbalta roughly 1 week ago. She feels that she may have had an allergic reaction to Cymbalta resulting in her shortness of breath. The patient did have a history of subarachnoid hemorrhage secondary to trauma. She was kept off the warfarin for one month in March 2015. This has been restarted since then. patient's INR was therapeutic at the time of admission. The patient was started on intravenous furosemide. The patient had good clinical response with improvement in her dyspnea. The patient was discharged home with her previous dose of furosemide. She has an appointment to see her cardiologist, Dr. Alvester Chou in 1 week. Ambulatory pulse oximetry was performed and the patient did have oxygen desaturation. The patient was sent home with home oxygen.   Consultants: cardiology  Discharge Exam: Filed Vitals:   01/31/15 0623  BP: 113/50  Pulse: 82  Temp: 98.7 F (37.1 C)  Resp: 20   Filed Vitals:   01/30/15 1331 01/30/15 2154 01/31/15 0500 01/31/15 4967  BP: 122/51 125/56  113/50  Pulse: 81 83  82  Temp: 98.1 F (36.7 C) 98.7 F (37.1 C)  98.7 F (37.1 C)  TempSrc: Oral  Oral  Oral  Resp: 18 20  20   Height:      Weight:   116.121 kg (256 lb)   SpO2: 94% 95%  97%   General: A&O x 3, NAD, pleasant, cooperative Cardiovascular: RRR, no rub, no gallop, no S3 Respiratory: CTAB, no wheeze, no rhonchi Abdomen:soft, nontender, nondistended, positive bowel sounds Extremities: 1+LE edema, No lymphangitis, no petechiae  Discharge Instructions      Discharge Instructions    Diet - low sodium heart healthy    Complete by:  As directed      For home use only DME oxygen    Complete by:  As directed   Mode or (Route):  Nasal cannula  Liters per Minute:  2  Frequency:  Continuous (stationary and portable oxygen unit needed)  Oxygen delivery system:  Gas     Increase activity slowly    Complete by:  As directed             Medication List    STOP taking these medications        DULoxetine 30 MG capsule  Commonly known as:  CYMBALTA      TAKE these medications        acetaminophen 325 MG tablet  Commonly known as:  TYLENOL  Take 2 tablets (650 mg total) by mouth every 4 (four) hours as needed for headache.     COLACE PO  Take 1 capsule by mouth as needed (constipation).     cyanocobalamin 1000 MCG/ML injection  Commonly known as:  (VITAMIN B-12)  Inject 1,000 mcg into the muscle every 30 (thirty) days.     escitalopram 20 MG tablet  Commonly known as:  LEXAPRO  Take 1 tablet by mouth daily.     FIBER SELECT GUMMIES Chew  Chew 1 tablet by mouth 2 (two) times daily.     fluticasone 50 MCG/ACT nasal spray  Commonly known as:  FLONASE  Place 2 sprays into the nose daily as needed for allergies or rhinitis.     folic acid 1 MG tablet  Commonly known as:  FOLVITE  Take 1 mg by mouth daily.     furosemide 80 MG tablet  Commonly known as:  LASIX  Take 1 tablet (80 mg total) by mouth every morning.     furosemide 40 MG tablet  Commonly known as:  LASIX  Take 1 tablet (40 mg total) by mouth daily at 3 pm.     hydrocortisone 2.5 % cream    APPLY RECTALLY 3 TO 4 TIMES DAILY AS NEEDED.     Insulin Lispro Prot & Lispro (75-25) 100 UNIT/ML Kwikpen  Commonly known as:  HUMALOG 75/25 MIX  Inject 40 Units into the skin 2 (two) times daily.     loratadine 10 MG tablet  Commonly known as:  CLARITIN  Take 10 mg by mouth daily as needed for allergies.     LORazepam 0.5 MG tablet  Commonly known as:  ATIVAN  Take 1 tablet by mouth daily as needed.     mupirocin cream 2 %  Commonly known as:  BACTROBAN  Apply 1 application topically daily as needed.     warfarin 5 MG tablet  Commonly known as:  COUMADIN  Take 5-7.5 mg by mouth daily. 5 mg daily except 7.5 mg on  Wednesday.         The results of significant diagnostics from this hospitalization (including imaging, microbiology, ancillary and laboratory) are listed below for reference.    Significant Diagnostic Studies: Dg Chest Portable 1 View  01/29/2015   CLINICAL DATA:  Shortness of breath and weakness  EXAM: PORTABLE CHEST - 1 VIEW  COMPARISON:  May 16, 2014  FINDINGS: New there is mild interstitial edema with cardiomegaly and pulmonary venous hypertension. Patient is status post median sternotomy. There is no airspace consolidation. No adenopathy. Patient is status post aortic valve replacement.  IMPRESSION: Evidence a degree of congestive heart failure. No airspace consolidation.   Electronically Signed   By: Lowella Grip III M.D.   On: 01/29/2015 10:54     Microbiology: No results found for this or any previous visit (from the past 240 hour(s)).   Labs: Basic Metabolic Panel:  Recent Labs Lab 01/29/15 1037 01/30/15 0519 01/31/15 0518  NA 139 143 141  K 4.3 4.0 3.9  CL 103 104 101  CO2 27 31 29   GLUCOSE 262* 107* 171*  BUN 52* 56* 68*  CREATININE 1.56* 1.52* 1.62*  CALCIUM 8.9 8.9 8.7*   Liver Function Tests:  Recent Labs Lab 01/29/15 1037  AST 22  ALT 15  ALKPHOS 72  BILITOT 2.6*  PROT 7.5  ALBUMIN 4.0   No results for input(s):  LIPASE, AMYLASE in the last 168 hours. No results for input(s): AMMONIA in the last 168 hours. CBC:  Recent Labs Lab 01/29/15 1037  WBC 8.6  NEUTROABS 4.7  HGB 11.6*  HCT 37.1  MCV 84.1  PLT 92*   Cardiac Enzymes:  Recent Labs Lab 01/29/15 1037  TROPONINI 0.03   BNP: Invalid input(s): POCBNP CBG:  Recent Labs Lab 01/30/15 1631 01/30/15 2015 01/31/15 0006 01/31/15 0417 01/31/15 0812  GLUCAP 177* 190* 250* 163* 196*    Time coordinating discharge:  Greater than 30 minutes  Signed:  Skippy Marhefka, DO [facility] Pager: [phone] 01/31/2015, 9:52 AM

## 2015-01-31 NOTE — Care Management Note (Signed)
Case Management Note  Patient Details  Name: GARIELLE MROZ MRN: [id number] Date of Birth: 09-24-1946  Subjective/Objective:                    Action/Plan:   Expected Discharge Date:  02/01/15               Expected Discharge Plan:  Home/Self Care  In-House Referral:  NA  Discharge planning Services  CM Consult  Post Acute Care Choice:  Durable Medical Equipment Choice offered to:  Patient  DME Arranged:  Oxygen DME Agency:  [place]:    [facility] Agency:     Status of Service:  Completed, signed off  Medicare Important Message Given:  N/A - LOS <3 / Initial given by admissions Date Medicare IM Given:    Medicare IM give by:    Date Additional Medicare IM Given:    Additional Medicare Important Message give by:     If discussed at [place] of Stay Meetings, dates discussed:    Additional Comments:  Joylene Draft, RN 01/31/2015, 10:20 AM

## 2015-02-02 DIAGNOSIS — J0100 Acute maxillary sinusitis, unspecified: Secondary | ICD-10-CM | POA: Diagnosis not present

## 2015-02-02 DIAGNOSIS — Z6841 Body Mass Index (BMI) 40.0 and over, adult: Secondary | ICD-10-CM | POA: Diagnosis not present

## 2015-02-07 ENCOUNTER — Other Ambulatory Visit (HOSPITAL_COMMUNITY): Payer: Medicare Other

## 2015-02-09 ENCOUNTER — Inpatient Hospital Stay (HOSPITAL_COMMUNITY): Admission: RE | Admit: 2015-02-09 | Payer: Medicare Other | Source: Ambulatory Visit

## 2015-02-09 ENCOUNTER — Ambulatory Visit (HOSPITAL_COMMUNITY): Payer: Medicare Other

## 2015-02-10 ENCOUNTER — Telehealth (HOSPITAL_COMMUNITY): Payer: Self-pay | Admitting: *Deleted

## 2015-02-13 ENCOUNTER — Ambulatory Visit (HOSPITAL_COMMUNITY)
Admission: RE | Admit: 2015-02-13 | Discharge: 2015-02-13 | Disposition: A | Discharge status: Home / Self Care | Payer: Medicare Other | Source: Ambulatory Visit | Attending: Cardiovascular Disease | Admitting: Cardiovascular Disease

## 2015-02-13 DIAGNOSIS — Z954 Presence of other heart-valve replacement: Secondary | ICD-10-CM

## 2015-02-13 DIAGNOSIS — I10 Essential (primary) hypertension: Secondary | ICD-10-CM | POA: Insufficient documentation

## 2015-02-13 DIAGNOSIS — E119 Type 2 diabetes mellitus without complications: Secondary | ICD-10-CM | POA: Insufficient documentation

## 2015-02-13 DIAGNOSIS — Z952 Presence of prosthetic heart valve: Secondary | ICD-10-CM

## 2015-02-13 DIAGNOSIS — Z794 Long term (current) use of insulin: Secondary | ICD-10-CM | POA: Insufficient documentation

## 2015-02-13 DIAGNOSIS — I059 Rheumatic mitral valve disease, unspecified: Secondary | ICD-10-CM | POA: Insufficient documentation

## 2015-02-14 DIAGNOSIS — N183 Chronic kidney disease, stage 3 (moderate): Secondary | ICD-10-CM | POA: Diagnosis not present

## 2015-02-14 DIAGNOSIS — J811 Chronic pulmonary edema: Secondary | ICD-10-CM | POA: Diagnosis not present

## 2015-02-14 DIAGNOSIS — Z6841 Body Mass Index (BMI) 40.0 and over, adult: Secondary | ICD-10-CM | POA: Diagnosis not present

## 2015-02-14 DIAGNOSIS — D510 Vitamin B12 deficiency anemia due to intrinsic factor deficiency: Secondary | ICD-10-CM | POA: Diagnosis not present

## 2015-02-17 ENCOUNTER — Ambulatory Visit (INDEPENDENT_AMBULATORY_CARE_PROVIDER_SITE_OTHER): Payer: Medicare Other | Admitting: Cardiovascular Disease

## 2015-02-17 ENCOUNTER — Encounter: Payer: Self-pay | Admitting: Internal Medicine

## 2015-02-17 ENCOUNTER — Encounter: Payer: Self-pay | Admitting: Cardiovascular Disease

## 2015-02-17 VITALS — BP 120/60 | HR 84 | Ht 63.5 in | Wt 256.8 lb

## 2015-02-17 DIAGNOSIS — I10 Essential (primary) hypertension: Secondary | ICD-10-CM | POA: Diagnosis not present

## 2015-02-17 DIAGNOSIS — I5032 Chronic diastolic (congestive) heart failure: Secondary | ICD-10-CM | POA: Diagnosis not present

## 2015-02-17 DIAGNOSIS — I050 Rheumatic mitral stenosis: Secondary | ICD-10-CM | POA: Insufficient documentation

## 2015-02-17 DIAGNOSIS — Z954 Presence of other heart-valve replacement: Secondary | ICD-10-CM | POA: Diagnosis not present

## 2015-02-17 DIAGNOSIS — Z952 Presence of prosthetic heart valve: Secondary | ICD-10-CM

## 2015-02-17 NOTE — Patient Instructions (Signed)
Your physician has requested that you have a TEE with Dr Debara Pickett. During a TEE, sound waves are used to create images of your heart. It provides your doctor with information about the size and shape of your heart and how well your heart's chambers and valves are working. In this test, a transducer is attached to the end of a flexible tube that's guided down your throat and into your esophagus (the tube leading from you mouth to your stomach) to get a more detailed image of your heart. You are not awake for the procedure. Please see the instruction sheet given to you today. For further information please visit [email or web address].  Dr Gwenlyn Found has referred you to Dr Cyndia Bent after your procedure.  Dr Gwenlyn Found recommends that you schedule a follow-up appointment in 2 months.

## 2015-02-17 NOTE — Assessment & Plan Note (Signed)
History of hypertension blood pressure measured at 120/60. She is not on any antihypertensive medications.

## 2015-02-17 NOTE — Assessment & Plan Note (Signed)
History of diastolic heart failure as well as severe mitral stenosis on Lasix twice a day. She has had admissions for dietary indiscretion.

## 2015-02-17 NOTE — Assessment & Plan Note (Signed)
History of St. Jude aortic valve replacement by Dr.Bartle 08/01/10. She had normal coronary arteries at that time. Recent echo revealed her aortic prosthesis to be normally functioning.

## 2015-02-17 NOTE — Assessment & Plan Note (Signed)
History of progressive mitral stenosis. It was mild by 2-D echo a year ago and developed moderate to severe. This was read by Dr. Debara Pickett on 02/13/15 with a mean transmitral gradient of 15 mmHg. She may require mitral valve replacement. I'm going to obtain a transesophageal echo to further evaluate this and I'm referring her back to Dr. Cyndia Bent for surgical evaluation.

## 2015-02-17 NOTE — Progress Notes (Signed)
02/17/2015 Hailey Curry   1946/04/26  [id number]  Primary Physician Purvis Kilts, MD Primary Cardiologist: Lorretta Harp MD Renae Gloss   HPI:  The patient is a 69 year old moderately overweight married Caucasian female with no children, whose husband is also a patient of mine and is accompanying her today. I saw her last 11/11/14... 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 Jude AVR with a 21 mm St. Jude Regent mechanical valve by Dr. Arvid Right, August 01, 2010. She had normal coronary arteries and normal left ventricular function. She did have some perioperative PAF. She has sleep apnea, intolerant to CPAP. Her other problems include hypertension, hyperlipidemia intolerant to statin drugs, and non-insulin-requiring diabetes. She is otherwise asymptomatic. Early last year she fell down and hit her head and her subarachnoid hematoma and was off Coumadin for a month after that. She does desaturate with ambulation and has what sounds like diastolic dysfunction as well as mild to moderate renal insufficiency followed by Dr. [name] her serum creatinine runs in the 1.6 range.. She currently takes 120 mg of furosemide daily which improves her breathing.her last lipid profile performed by her nephrologist 11/01/14 revealed total cholesterol 200, LDL 116 and HDL of 48. She was hospitalized at [facility] for congestive heart failure after having had pizza and was diuresed.  She does complain of dyspnea on exertion. Recent 2-D echo showed a well functioning aortic mechanical prosthesis, normal LV function with moderate to severe mitral stenosis with strep represents progression of disease.   Current Outpatient Prescriptions  Medication Sig Dispense Refill  . acetaminophen (TYLENOL) 325 MG tablet Take 2 tablets (650 mg total) by mouth every 4 (four) hours as needed for headache.    . cyanocobalamin (,VITAMIN B-12,) 1000 MCG/ML injection Inject  1,000 mcg into the muscle every 30 (thirty) days.    Mariane Baumgarten Sodium (COLACE PO) Take 1 capsule by mouth as needed (constipation).     Marland Kitchen escitalopram (LEXAPRO) 20 MG tablet Take 0.5 tablets by mouth daily.     Marland Kitchen FIBER SELECT GUMMIES CHEW Chew 1 tablet by mouth 2 (two) times daily.    . fluticasone (FLONASE) 50 MCG/ACT nasal spray Place 2 sprays into the nose daily as needed for allergies or rhinitis.     . folic acid (FOLVITE) 1 MG tablet Take 1 mg by mouth daily.    . furosemide (LASIX) 40 MG tablet Take 1 tablet (40 mg total) by mouth daily at 3 pm. 30 tablet 0  . furosemide (LASIX) 80 MG tablet Take 1 tablet (80 mg total) by mouth every morning. 30 tablet 0  . hydrocortisone 2.5 % cream APPLY RECTALLY 3 TO 4 TIMES DAILY AS NEEDED. 30 g 2  . Insulin Lispro Prot & Lispro (HUMALOG 75/25 MIX) (75-25) 100 UNIT/ML Kwikpen Inject into the skin. 35 units in the AM 30 units in the PM    . loratadine (CLARITIN) 10 MG tablet Take 10 mg by mouth daily as needed for allergies.     Marland Kitchen LORazepam (ATIVAN) 0.5 MG tablet Take 1 tablet by mouth daily as needed.    . mupirocin cream (BACTROBAN) 2 % Apply 1 application topically daily as needed.    . warfarin (COUMADIN) 5 MG tablet Take 5-7.5 mg by mouth daily. 5 mg daily except 7.5 mg on Wednesday.     No current facility-administered medications for this visit.    Allergies  Allergen Reactions  . Demerol Other (See  Comments)    Blood pressure goes up  . Meperidine Other (See Comments)    BP fluctuations  . Naprosyn [Naproxen] Swelling    Fluid retention Legs swelling    History   Social History  . Marital Status: Married    Spouse Name: N/A  . Number of Children: N/A  . Years of Education: N/A   Occupational History  . Not on file.   Social History Main Topics  . Smoking status: Never Smoker   . Smokeless tobacco: Never Used  . Alcohol Use: No  . Drug Use: No  . Sexual Activity: Not Currently    Birth Control/ Protection:  Post-menopausal   Other Topics Concern  . Not on file   Social History Narrative     Review of Systems: General: negative for chills, fever, night sweats or weight changes.  Cardiovascular: negative for chest pain, dyspnea on exertion, edema, orthopnea, palpitations, paroxysmal nocturnal dyspnea or shortness of breath Dermatological: negative for rash Respiratory: negative for cough or wheezing Urologic: negative for hematuria Abdominal: negative for nausea, vomiting, diarrhea, bright red blood per rectum, melena, or hematemesis Neurologic: negative for visual changes, syncope, or dizziness All other systems reviewed and are otherwise negative except as noted above.    Blood pressure 120/60, pulse 84, height 5' 3.5" (1.613 m), weight 256 lb 12.8 oz (116.484 kg).  General appearance: alert and no distress Neck: no adenopathy, no carotid bruit, no JVD, supple, symmetrical, trachea midline and thyroid not enlarged, symmetric, no tenderness/mass/nodules Lungs: clear to auscultation bilaterally Heart: crisp prosthetic valve sounds Extremities: trace lower extremity edema  EKG performed today  ASSESSMENT AND PLAN:   S/P AVR (aortic valve replacement) History of St. Jude aortic valve replacement by Dr.Bartle 08/01/10. She had normal coronary arteries at that time. Recent echo revealed her aortic prosthesis to be normally functioning.   Hypertension History of hypertension blood pressure measured at 120/60. She is not on any antihypertensive medications.   Congestive heart failure History of diastolic heart failure as well as severe mitral stenosis on Lasix twice a day. She has had admissions for dietary indiscretion.   Mitral stenosis History of progressive mitral stenosis. It was mild by 2-D echo a year ago and developed moderate to severe. This was read by Dr. Debara Pickett on 02/13/15 with a mean transmitral gradient of 15 mmHg. She may require mitral valve replacement. I'm going to  obtain a transesophageal echo to further evaluate this and I'm referring her back to Dr. Cyndia Bent for surgical evaluation.       Lorretta Harp MD FACP,FACC,FAHA, [facility] 02/17/2015 12:24 PM

## 2015-02-22 ENCOUNTER — Ambulatory Visit (INDEPENDENT_AMBULATORY_CARE_PROVIDER_SITE_OTHER): Payer: Medicare Other | Admitting: *Deleted

## 2015-02-22 DIAGNOSIS — Z954 Presence of other heart-valve replacement: Secondary | ICD-10-CM

## 2015-02-22 DIAGNOSIS — Z7901 Long term (current) use of anticoagulants: Secondary | ICD-10-CM

## 2015-02-22 DIAGNOSIS — Z952 Presence of prosthetic heart valve: Secondary | ICD-10-CM

## 2015-02-22 LAB — POCT INR: INR: 2.5

## 2015-02-23 ENCOUNTER — Ambulatory Visit (HOSPITAL_COMMUNITY)
Admission: RE | Admit: 2015-02-23 | Discharge: 2015-02-23 | Disposition: A | Discharge status: Home / Self Care | Payer: Medicare Other | Source: Ambulatory Visit | Attending: Internal Medicine | Admitting: Internal Medicine

## 2015-02-23 ENCOUNTER — Encounter (HOSPITAL_COMMUNITY)
Admission: RE | Disposition: A | Discharge status: Home / Self Care | Payer: Self-pay | Source: Ambulatory Visit | Attending: Internal Medicine

## 2015-02-23 ENCOUNTER — Encounter (HOSPITAL_COMMUNITY): Payer: Self-pay

## 2015-02-23 ENCOUNTER — Encounter: Payer: Medicare Other | Admitting: Surgery

## 2015-02-23 DIAGNOSIS — I071 Rheumatic tricuspid insufficiency: Secondary | ICD-10-CM | POA: Diagnosis not present

## 2015-02-23 DIAGNOSIS — I5033 Acute on chronic diastolic (congestive) heart failure: Secondary | ICD-10-CM | POA: Diagnosis present

## 2015-02-23 DIAGNOSIS — I052 Rheumatic mitral stenosis with insufficiency: Secondary | ICD-10-CM | POA: Diagnosis not present

## 2015-02-23 DIAGNOSIS — I342 Nonrheumatic mitral (valve) stenosis: Secondary | ICD-10-CM | POA: Diagnosis not present

## 2015-02-23 DIAGNOSIS — Z952 Presence of prosthetic heart valve: Secondary | ICD-10-CM

## 2015-02-23 DIAGNOSIS — I050 Rheumatic mitral stenosis: Secondary | ICD-10-CM | POA: Diagnosis present

## 2015-02-23 HISTORY — PX: TEE WITHOUT CARDIOVERSION: SHX5443

## 2015-02-23 LAB — GLUCOSE, CAPILLARY: Glucose-Capillary: 238 mg/dL — ABNORMAL HIGH (ref 65–99)

## 2015-02-23 SURGERY — ECHOCARDIOGRAM, TRANSESOPHAGEAL
Anesthesia: Moderate Sedation

## 2015-02-23 MED ORDER — MIDAZOLAM HCL 10 MG/2ML IJ SOLN
INTRAMUSCULAR | Status: DC | PRN
  Administered 2015-02-23: 2 mg via INTRAVENOUS
  Administered 2015-02-23 (×3): 1 mg via INTRAVENOUS

## 2015-02-23 MED ORDER — DIPHENHYDRAMINE HCL 50 MG/ML IJ SOLN
INTRAMUSCULAR | Status: AC
  Filled 2015-02-23: qty 1

## 2015-02-23 MED ORDER — FENTANYL CITRATE (PF) 100 MCG/2ML IJ SOLN
INTRAMUSCULAR | Status: DC | PRN
  Administered 2015-02-23 (×2): 25 ug via INTRAVENOUS

## 2015-02-23 MED ORDER — MIDAZOLAM HCL 5 MG/ML IJ SOLN
INTRAMUSCULAR | Status: AC
  Filled 2015-02-23: qty 3

## 2015-02-23 MED ORDER — LIDOCAINE VISCOUS 2 % MT SOLN
OROMUCOSAL | Status: DC | PRN
  Administered 2015-02-23: 10 mL via OROMUCOSAL

## 2015-02-23 MED ORDER — BUTAMBEN-TETRACAINE-BENZOCAINE 2-2-14 % EX AERO
INHALATION_SPRAY | CUTANEOUS | Status: DC | PRN
  Administered 2015-02-23: 2 via TOPICAL

## 2015-02-23 MED ORDER — LIDOCAINE VISCOUS 2 % MT SOLN
OROMUCOSAL | Status: AC
  Filled 2015-02-23: qty 15

## 2015-02-23 MED ORDER — FENTANYL CITRATE (PF) 100 MCG/2ML IJ SOLN
INTRAMUSCULAR | Status: AC
  Filled 2015-02-23: qty 4

## 2015-02-23 MED ORDER — SODIUM CHLORIDE 0.9 % IV SOLN
INTRAVENOUS | Status: DC
  Administered 2015-02-23: 10:00:00 via INTRAVENOUS

## 2015-02-23 NOTE — H&P (Signed)
     INTERVAL PROCEDURE H&P  History and Physical Interval Note:  02/23/2015 9:57 AM  Hailey Curry has presented today for their planned procedure. The various methods of treatment have been discussed with the patient and family. After consideration of risks, benefits and other options for treatment, the patient has consented to the procedure.  The patients' outpatient history has been reviewed, patient examined, and no change in status from most recent office note within the past 30 days. I have reviewed the patients' chart and labs and will proceed as planned. Questions were answered to the patient's satisfaction.   Pixie Casino, MD, [facility] Attending Cardiologist [facility] Tray[facility] Caravello 02/23/2015, 9:57 AM

## 2015-02-23 NOTE — CV Procedure (Signed)
TRANSESOPHAGEAL ECHOCARDIOGRAM (TEE) NOTE  INDICATIONS: mitral stenosis  PROCEDURE:   Informed consent was obtained prior to the procedure. The risks, benefits and alternatives for the procedure were discussed and the patient comprehended these risks.  Risks include, but are not limited to, cough, sore throat, vomiting, nausea, somnolence, esophageal and stomach trauma or perforation, bleeding, low blood pressure, aspiration, pneumonia, infection, trauma to the teeth and death.    After a procedural time-out, the patient was given 4 mg versed and 50 mcg fentanyl for moderate sedation.  The oropharynx was anesthetized 10 cc of topical 1% viscous lidocaine and 2 cetacaine sprays.  The transesophageal probe was inserted in the esophagus and stomach without difficulty and multiple views were obtained.  The patient was kept under observation until the patient left the procedure room.  The patient left the procedure room in stable condition.   Agitated microbubble saline contrast was not administered.  COMPLICATIONS:    There were no immediate complications.  Findings:  1. LEFT VENTRICLE: The left ventricular wall thickness is moderately increased.  The left ventricular cavity is normal in size. Wall motion is hyperdynamic.  LVEF is 60-65%.  2. RIGHT VENTRICLE:  The right ventricle is normal in structure and function without any thrombus or masses.    3. LEFT ATRIUM:  The left atrium is severely dilated in size without any thrombus or masses.  There is not spontaneous echo contrast ("smoke") in the left atrium consistent with a low flow state.  4. LEFT ATRIAL APPENDAGE:  The left atrial appendage is free of any thrombus or masses. The appendage has single lobes. Pulse doppler indicates moderate flow in the appendage.  5. ATRIAL SEPTUM:  The atrial septum appears intact and is free of thrombus and/or masses.  There is no evidence for interatrial shunting by color doppler.  6. RIGHT  ATRIUM:  The right atrium is normal in size and function without any thrombus or masses.  7. MITRAL VALVE:  The mitral valve is moderately calcified with restricted leaflet motion. There is narrowing of the mitral annulus with moderate to more likely severe stenosis. This was visualized in 2D and 3D images. The MVA by planimetry is 1.43 cm2 - calculated MVA is 1.46 cm2. The P1/2t is 124 msec, however, the HR was elevated to 90 during the procedure. There is trivial regurgitation.  There were no vegetations.  8. AORTIC VALVE:  The 21 mm St. Jude aortic valve was well-visualized. Normal leaflet motion is noted. No paravalvular abscess or leak is noted. There is trivial cleaning regurgitation.  There were no vegetations or stenosis  9. TRICUSPID VALVE:  The tricuspid valve is normal in structure and function with mild to moderate regurgitation.  There were no vegetations or stenosis  10.  PULMONIC VALVE:  The pulmonic valve is normal in structure and function with trivial regurgitation.  There were no vegetations or stenosis.   11. AORTIC ARCH, ASCENDING AND DESCENDING AORTA:  There was grade 1 Ron Parker et. Al, 1992) atherosclerosis of the proximal descending aorta.  12. PULMONARY VEINS: Anomalous pulmonary venous return was not noted.  13. PERICARDIUM: The pericardium appeared normal and non-thickened.  There is no pericardial effusion.  IMPRESSION:   1. Moderate to more likely severe, calcific mitral stenosis 2. Severe LAE 3. Normally functioning St. Jude AVR - 21 mm 4. Moderate TR 5. LVEF 60-65%  RECOMMENDATIONS:    1.  Surgical consultation is recommended to evaluate for possible MVR. She reports progressive dyspnea, especially  with exercise. There is moderate pulmonary hypertension at rest with severe left atrial enlargement. The MVA measures <1.5 cm2, consistent with likely severe mitral stenosis.  Time Spent Directly with the Patient:  60 minutes   Pixie Casino, MD,  [facility] Attending Cardiologist [facility]  02/23/2015, 11:43 AM

## 2015-02-23 NOTE — Progress Notes (Signed)
  Echocardiogram Echocardiogram Transesophageal has been performed.  Johny Chess 02/23/2015, 11:46 AM

## 2015-02-23 NOTE — Discharge Instructions (Signed)
Transesophageal Echocardiogram °Transesophageal echocardiography (TEE) is a picture test of your heart using sound waves. The pictures taken can give very detailed pictures of your heart. This can help your doctor see if there are problems with your heart. TEE can check: °· If your heart has blood clots in it. °· How well your heart valves are working. °· If you have an infection on the inside of your heart. °· Some of the major arteries of your heart. °· If your heart valve is working after a repair. °· Your heart before a procedure that uses a shock to your heart to get the rhythm back to normal. °BEFORE THE PROCEDURE °· Do not eat or drink for 6 hours before the procedure or as told by your doctor. °· Make plans to have someone drive you home after the procedure. Do not drive yourself home. °· An IV tube will be put in your arm. °PROCEDURE °· You will be given a medicine to help you relax (sedative). It will be given through the IV tube. °· A numbing medicine will be sprayed or gargled in the back of your throat to help numb it. °· The tip of the probe is placed into the back of your mouth. You will be asked to swallow. This helps to pass the probe into your esophagus. °· Once the tip of the probe is in the right place, your doctor can take pictures of your heart. °· You may feel pressure at the back of your throat. °AFTER THE PROCEDURE °· You will be taken to a recovery area so the sedative can wear off. °· Your throat may be sore and scratchy. This will go away slowly over time. °· You will go home when you are fully awake and able to swallow liquids. °· You should have someone stay with you for the next 24 hours. °· Do not drive or operate machinery for the next 24 hours. °Document Released: 07/14/2009 Document Revised: 09/21/2013 Document Reviewed: 03/18/2013 °[employer]® Patient Information ©2015 [employer]. This information is not intended to replace advice given to you by your health care provider. Make  sure you discuss any questions you have with your health care provider. ° °

## 2015-02-24 ENCOUNTER — Encounter (HOSPITAL_COMMUNITY): Payer: Self-pay | Admitting: Internal Medicine

## 2015-02-28 ENCOUNTER — Encounter: Payer: Medicare Other | Admitting: Surgery

## 2015-02-28 ENCOUNTER — Other Ambulatory Visit (HOSPITAL_COMMUNITY): Payer: Medicare Other

## 2015-03-01 ENCOUNTER — Institutional Professional Consult (permissible substitution) (INDEPENDENT_AMBULATORY_CARE_PROVIDER_SITE_OTHER): Payer: Medicare Other | Admitting: Surgery

## 2015-03-01 ENCOUNTER — Encounter: Payer: Self-pay | Admitting: Surgery

## 2015-03-01 VITALS — BP 146/70 | HR 91 | Resp 18 | Ht 63.5 in | Wt 253.0 lb

## 2015-03-01 DIAGNOSIS — Z954 Presence of other heart-valve replacement: Secondary | ICD-10-CM | POA: Diagnosis not present

## 2015-03-01 DIAGNOSIS — I050 Rheumatic mitral stenosis: Secondary | ICD-10-CM | POA: Diagnosis not present

## 2015-03-01 DIAGNOSIS — Z952 Presence of prosthetic heart valve: Secondary | ICD-10-CM

## 2015-03-02 ENCOUNTER — Encounter: Payer: Self-pay | Admitting: Surgery

## 2015-03-02 NOTE — Progress Notes (Signed)
Cardiothoracic Surgery Consultation  PCP is Purvis Kilts, MD Referring Provider is Lorretta Harp, MD  Chief Complaint  Patient presents with  . Mitral Stenosis    ECHO 02/13/15, TEE 02/23/15    HPI:  The patient is a morbidly obese woman with type 2 DM, HTN, HLD, OSA, and known mitral stenosis who underwent 21 mm St. Jude mechanical AVR in 2011 by me for severe aortic stenosis. She had a calcified mitral annulus at that time but no mitral stenosis. She had normal coronary arteries and normal LV function at that time. She has developed progressive exertional dyspnea over the past several months and has been desaturating on ambulation. She has been on home oxygen for the past 2 weeks. She is being followed by Dr. [name] for stage 3 CKD and is on lasix 120 mg daily for fluid retention which has helped her breathing. Her most recent  2D echo on 02/13/2015  Showed a mean transmitral gradient of 15 mm Hg and a valve area of 1.47 cm2 consistent with moderate to severe mitral stenosis. There was severe LA dilation, elevated PA peak pressure estimated at 50 mm Hg and moderate TR. LVEF was 65-70% with moderate LVH. The prosthetic aortic valve was functioning normally with a mean gradient of 16 mm Hg. Her prior 2D echo on 02/16/2014 showed a mean gradient across the mitral valve of 10 mm Hg. TEE on 02/23/2015 showed a calcified mitral valve with fixed leaflet bases and restricted leaflet motion with an AVA of 1.43 cm2 by planimetry and 1.77 cm2 by pressure half time with trivial MR.   She lives at home with her husband. She had 3 falls in the past year and a couple others since 2012 sustaining severe scalp contusions a couple times. She now uses a rolling walker.  Past Medical History  Diagnosis Date  . Type 2 diabetes mellitus   . Essential hypertension   . Hyperlipidemia   . Breast cancer 11/2003  . Colonic adenoma   . OSA (obstructive sleep apnea)     AHI 45.0/hr, REM sleep was 231  mins.sleep was 77% and 84%, RDI was 21.5/hr total sleep was 2h 58 mins  . Aortic stenosis     Status post 21 mm St. Jude Regent mechanical valve 2011   . Scalp laceration   . Traumatic subarachnoid hemorrhage 12/21/2013  . Chronic anemia   . Chronic abdominal pain   . CKD (chronic kidney disease) stage 3, GFR 30-59 ml/min   . Diastolic heart failure   . Mitral stenosis     Past Surgical History  Procedure Laterality Date  . Breast surgery  12/16/2003  . Aortic valve replacement  08/01/10    Dr Arvid Right, St Jude AVR with 21 mm St Jude regent mechanical valve  . Colonoscopy  03/04/2003    Dr. Gala Romney- internal hemorrhoids o/w normal rectum, scattered L sided diveritula  . Colonoscopy  04/12/2009    Dr. Imogene Burn polyp, L dided diverticula, tubular adenoma on bx  . Colonoscopy N/A 01/06/2013    RMR; multiple colonic/rectal polyps s/p polypectomy. Fragments of tubular adenomas, needs surveillance Oct 2014  . Colonoscopy N/A 01/18/2013    BSJ:GGEZMO polypectomy site with bleeding, s/p bleeding control therapy  . Cardiac catheterization  05/2010    Normal coronary arteries angiographically, normal left ventricular functionultrasonographically and severe aortic stenosis by right cath. Optimal therapy would be elective aortic valve replacement  . Doppler echocardiography    . Stress myocardial perfusion    .  Ultrasound guidance for vascular access    . Tee without cardioversion N/A 02/23/2015    Procedure: TRANSESOPHAGEAL ECHOCARDIOGRAM (TEE);  Surgeon: Pixie Casino, MD;  Location: [facility] ENDOSCOPY;  Service: Cardiovascular;  Laterality: N/A;    Family History  Problem Relation Age of Onset  . Heart failure Mother   . CAD    . Diabetes    . Other Brother     Knee problems  . Hypertension Brother   . Heart disease Maternal Grandmother     Social History History  Substance Use Topics  . Smoking status: Never Smoker   . Smokeless tobacco: Never Used  . Alcohol Use: No    Current  Outpatient Prescriptions  Medication Sig Dispense Refill  . acetaminophen (TYLENOL) 325 MG tablet Take 2 tablets (650 mg total) by mouth every 4 (four) hours as needed for headache.    . cyanocobalamin (,VITAMIN B-12,) 1000 MCG/ML injection Inject 1,000 mcg into the muscle every 30 (thirty) days.    Mariane Baumgarten Sodium (COLACE PO) Take 1 capsule by mouth as needed (constipation).     Marland Kitchen escitalopram (LEXAPRO) 20 MG tablet Take 0.5 tablets by mouth daily.     Marland Kitchen FIBER SELECT GUMMIES CHEW Chew 1 tablet by mouth 2 (two) times daily.    . fluticasone (FLONASE) 50 MCG/ACT nasal spray Place 2 sprays into the nose daily as needed for allergies or rhinitis.     . folic acid (FOLVITE) 1 MG tablet Take 1 mg by mouth daily.    . furosemide (LASIX) 40 MG tablet Take 1 tablet (40 mg total) by mouth daily at 3 pm. 30 tablet 0  . furosemide (LASIX) 80 MG tablet Take 1 tablet (80 mg total) by mouth every morning. 30 tablet 0  . hydrocortisone 2.5 % cream APPLY RECTALLY 3 TO 4 TIMES DAILY AS NEEDED. 30 g 2  . Insulin Lispro Prot & Lispro (HUMALOG 75/25 MIX) (75-25) 100 UNIT/ML Kwikpen Inject into the skin. 35 units in the AM 30 units in the PM    . loratadine (CLARITIN) 10 MG tablet Take 10 mg by mouth daily as needed for allergies.     Marland Kitchen LORazepam (ATIVAN) 0.5 MG tablet Take 1 tablet by mouth daily as needed.    . mupirocin cream (BACTROBAN) 2 % Apply 1 application topically daily as needed.    . warfarin (COUMADIN) 5 MG tablet Take 5-7.5 mg by mouth daily. 5 mg daily except 7.5 mg on Wednesday.     No current facility-administered medications for this visit.    Allergies  Allergen Reactions  . Cymbalta [Duloxetine Hcl] Shortness Of Breath and Other (See Comments)    Pt states she breaks out in blisters  . Demerol Other (See Comments)    Blood pressure goes up  . Meperidine Other (See Comments)    BP fluctuations  . Naprosyn [Naproxen] Swelling    Fluid retention Legs swelling    Review of Systems    Constitutional: Positive for activity change, appetite change and fatigue.  HENT: Negative.   Eyes:       Cataract surgery 05/2014  Respiratory: Positive for cough and shortness of breath.        Sleep apnea  Cardiovascular: Positive for leg swelling.       Chest tightness and shortness of breath on exertion.  Orthopnea  Gastrointestinal: Positive for constipation.  Endocrine: Negative.   Genitourinary:       Chronic kidney disease  Musculoskeletal: Positive for gait problem.  Allergic/Immunologic: Negative.   Neurological:       Neuropathy in legs  Hematological: Negative.   Psychiatric/Behavioral: Positive for dysphoric mood. The patient is nervous/anxious.     BP 146/70 mmHg  Pulse 91  Resp 18  Ht 5' 3.5" (1.613 m)  Wt 253 lb (114.76 kg)  BMI 44.11 kg/m2  SpO2 97% Physical Exam   Diagnostic Tests:          *[facility], [place], [place] [address]              [phone]  ------------------------------------------------------------------- Transthoracic Echocardiography  Patient:  Dahiana, Kulak MR #:    [id number] Study Date: 02/13/2015 Gender:   F Age:    76 Height:   160 cm Weight:   117.9 kg BSA:    2.36 m^2 Pt. Status: Room:  ATTENDING  Quay Burow, MD ORDERING   Quay Burow, MD [place], MD PERFORMING  [facility], Outpatient SONOGRAPHER [facility], RDCS  cc:  ------------------------------------------------------------------- LV EF: 65% -  70%  ------------------------------------------------------------------- Indications:   Mitral Valve Disease (I05.9).  ------------------------------------------------------------------- History:  PMH: Edema, Obstructive Sleep Apnea, Status Post Aortic Valve Replacement (72mm St. Jude, 07-2010) Dyspnea. Congestive heart  failure. Risk factors: Hypertension. Diabetes mellitus.  ------------------------------------------------------------------- Study Conclusions  - Left ventricle: The cavity size was normal. Wall thickness was increased in a pattern of moderate LVH. Systolic function was vigorous. The estimated ejection fraction was in the range of 65% to 70%. Wall motion was normal; there were no regional wall motion abnormalities. The study is not technically sufficient to allow evaluation of LV diastolic function. - Aortic valve: St. Jude mechanical AVR - no evidence for obstruction. Trivial cleaning regurgitant jet. Valve area (VTI): 2.15 cm^2. Valve area (Vmax): 1.87 cm^2. Valve area (Vmean): 2.04 cm^2. - Mitral valve: Calcified annulus. Moderate to severe mitral stenosis - mean gradient of 15 mmHg. Abascal score of 14. There was trivial regurgitation. Valve area by continuity equation (using LVOT flow): 1.47 cm^2. - Left atrium: Severely dilated at 49 ml/m2. - Tricuspid valve: There was moderate regurgitation. - Pulmonary arteries: PA peak pressure: 50 mm Hg (S). - Inferior vena cava: The vessel was normal in size. The respirophasic diameter changes were in the normal range (= 50%), consistent with normal central venous pressure.  Impressions:  - Compared to the prior study, there has been an increase in the mean mitral gradient to 15 mmHg (at at HR of 85), suggesting moderate to severe stenosis. There is now moderate pulmonary hypertension. The Abascal score is 14, by my calculation, which suggests that she would not be a candidate for balloon valvotomy.  Transthoracic echocardiography. M-mode, complete 2D, spectral Doppler, and color Doppler. Birthdate: Patient birthdate: 29-Jun-1946. Age: Patient is 69 yr old. Sex: Gender: female. BMI: 46.1 kg/m^2. Blood pressure:   140/62 Patient status: Outpatient. Study date: Study date: 02/13/2015.  Study time: 11:17 AM. Location: Echo laboratory.  -------------------------------------------------------------------  ------------------------------------------------------------------- Left ventricle: The cavity size was normal. Wall thickness was increased in a pattern of moderate LVH. Systolic function was vigorous. The estimated ejection fraction was in the range of 65% to 70%. Wall motion was normal; there were no regional wall motion abnormalities. The study is not technically sufficient to allow evaluation of LV diastolic function.  ------------------------------------------------------------------- Aortic valve: St. Jude mechanical AVR - no evidence for obstruction. Trivial  cleaning regurgitant jet. Doppler:   VTI ratio of LVOT to aortic valve: 0.69. Valve area (VTI): 2.15 cm^2. Indexed valve area (VTI): 0.91 cm^2/m^2. Peak velocity ratio of LVOT to aortic valve: 0.59. Valve area (Vmax): 1.87 cm^2. Indexed valve area (Vmax): 0.79 cm^2/m^2. Mean velocity ratio of LVOT to aortic valve: 0.65. Valve area (Vmean): 2.04 cm^2. Indexed valve area (Vmean): 0.86 cm^2/m^2.  Mean gradient (S): 16 mm Hg. Peak gradient (S): 31 mm Hg.  ------------------------------------------------------------------- Aorta: Aortic root: The aortic root was normal in size. Ascending aorta: The ascending aorta was normal in size.  ------------------------------------------------------------------- Mitral valve:  Calcified annulus. Moderate to severe mitral stenosis - mean gradient of 15 mmHg. Abascal score of 14. Doppler: There was trivial regurgitation.  Valve area by pressure half-time: 1.85 cm^2. Indexed valve area by pressure half-time: 0.78 cm^2/m^2. Valve area by continuity equation (using LVOT flow): 1.47 cm^2. Indexed valve area by continuity equation (using LVOT flow): 0.62 cm^2/m^2.  Mean gradient (D): 15 mm Hg. Peak gradient (D): 21 mm  Hg.  ------------------------------------------------------------------- Left atrium: Severely dilated at 49 ml/m2.  ------------------------------------------------------------------- Right ventricle: The cavity size was normal. Wall thickness was normal. Systolic function was normal.  ------------------------------------------------------------------- Pulmonic valve:  The valve appears to be grossly normal. Doppler: There was trivial regurgitation.  ------------------------------------------------------------------- Tricuspid valve:  Doppler: There was moderate regurgitation.  ------------------------------------------------------------------- Pulmonary artery:  The main pulmonary artery was normal-sized.  ------------------------------------------------------------------- Right atrium: The atrium was normal in size.  ------------------------------------------------------------------- Pericardium: There was no pericardial effusion.  ------------------------------------------------------------------- Systemic veins: Inferior vena cava: The vessel was normal in size. The respirophasic diameter changes were in the normal range (= 50%), consistent with normal central venous pressure. Diameter: 15.8 mm.  ------------------------------------------------------------------- Measurements  IVC                    Value     Reference ID                    15.8 mm    ---------  Left ventricle              Value     Reference LV ID, ED, PLAX chordal      (L)   36.1 mm    43 - 52 LV ID, ES, PLAX chordal      (L)   17.5 mm    23 - 38 LV fx shortening, PLAX chordal      52  %    >=29 LV PW thickness, ED            14  mm    --------- IVS/LV PW ratio, ED            0.81      <=1.3 Stroke volume, 2D             116  ml     --------- Stroke volume/bsa, 2D           49  ml/m^2  --------- LV e&', lateral              5.37 cm/s   --------- LV E/e&', lateral             43.02     --------- LV e&', medial               6.47 cm/s   --------- LV E/e&', medial              35.7      ---------  LV e&', average              5.92 cm/s   --------- LV E/e&', average             39.02     ---------  Ventricular septum            Value     Reference IVS thickness, ED             11.4 mm    ---------  LVOT                   Value     Reference LVOT ID, S                20  mm    --------- LVOT area                 3.14 cm^2   --------- LVOT peak velocity, S           166  cm/s   --------- LVOT mean velocity, S           118  cm/s   --------- LVOT VTI, S                37.1 cm    --------- LVOT peak gradient, S           11  mm Hg  ---------  Aortic valve               Value     Reference Aortic valve peak velocity, S       279  cm/s   --------- Aortic valve mean velocity, S       182  cm/s   --------- Aortic valve VTI, S            54.1 cm    --------- Aortic mean gradient, S          16  mm Hg  --------- Aortic peak gradient, S          31  mm Hg  --------- VTI ratio, LVOT/AV            0.69      --------- Aortic valve area, VTI          2.15 cm^2   --------- Aortic valve area/bsa, VTI        0.91 cm^2/m^2 --------- Velocity ratio, peak, LVOT/AV       0.59      --------- Aortic valve area, peak velocity     1.87 cm^2   --------- Aortic valve area/bsa, peak        0.79 cm^2/m^2  --------- velocity Velocity ratio, mean, LVOT/AV       0.65      --------- Aortic valve area, mean velocity     2.04 cm^2   --------- Aortic valve area/bsa, mean        0.86 cm^2/m^2 --------- velocity  Aorta                   Value     Reference Aortic root ID, ED            30  mm    ---------  Left atrium                Value     Reference LA ID, A-P, ES              41  mm    --------- LA  ID/bsa, A-P              1.74 cm/m^2  <=2.2 LA volume, S               107  ml    --------- LA volume/bsa, S             45.3 ml/m^2  --------- LA volume, ES, 1-p A4C          96  ml    --------- LA volume/bsa, ES, 1-p A4C        40.7 ml/m^2  --------- LA volume, ES, 1-p A2C          106  ml    --------- LA volume/bsa, ES, 1-p A2C        44.9 ml/m^2  ---------  Mitral valve               Value     Reference Mitral E-wave peak velocity        231  cm/s   --------- Mitral A-wave peak velocity        246  cm/s   --------- Mitral mean velocity, D          180  cm/s   --------- Mitral deceleration time     (H)   420  ms    150 - 230 Mitral pressure half-time         119  ms    --------- Mitral mean gradient, D          15  mm Hg  --------- Mitral peak gradient, D          21  mm Hg  --------- Mitral E/A ratio, peak          0.94      --------- Mitral valve area, PHT, DP        1.85 cm^2   --------- Mitral valve area/bsa, PHT, DP      0.78 cm^2/m^2 --------- Mitral valve area, LVOT          1.47 cm^2   --------- continuity Mitral valve area/bsa, LVOT        0.62 cm^2/m^2 --------- continuity Mitral annulus VTI, D            79.5 cm    ---------  Pulmonary arteries            Value     Reference PA pressure, S, DP        (H)   50  mm Hg  <=30  Tricuspid valve              Value     Reference Tricuspid regurg peak velocity      341  cm/s   --------- Tricuspid peak RV-RA gradient       47  mm Hg  ---------  Right ventricle              Value     Reference RV s&', lateral, S             7.68 cm/s   ---------  Legend: (L) and (H) mark values outside specified reference range.  ------------------------------------------------------------------- Prepared and Electronically Authenticated by  Lyman Bishop MD 2016-05-16T18:24:28    *[facility]*            [address]            [place], [place] [address]  [phone]  ------------------------------------------------------------------- Transesophageal Echocardiography  Patient:  Darriana, Deboy MR #:    [id number] Study Date: 02/23/2015 Gender:   F Age:    45 Height:   160 cm Weight:   116.1 kg BSA:    2.34 m^2 Pt. Status: Room:  ADMITTING  Lyman Bishop MD [place] MD [place] MD PERFORMING  Lyman Bishop MD SONOGRAPHER [facility], RDCS, CCT  cc:  ------------------------------------------------------------------- LV EF: 60% -  65%  ------------------------------------------------------------------- Indications:   Mitral stenosis [non-rheumatic] 424.0.  ------------------------------------------------------------------- Study Conclusions  - Left ventricle: Systolic function was normal. The estimated ejection fraction was in the range of 60% to 65%. Wall motion was normal; there were no regional wall motion  abnormalities. - Aortic valve: 21 mm St. Jude mechanical bileaflet valve. No obstruction. Trivial AI cleaning jet. - Aorta: Mild grade 1 atheroma. - Mitral valve: Calcified with fixed leaflet bases and narrowed annulus. Restricted leaflet motion. There is moderate to more likely severe stenosis. AVA by planimetry was 1.43 cm2 and by VTI was 1.46 cm2. P1/2 was 124 msec, however, HR was 90 BPM at the time of the study. There is trivial regurgitation. Valve area by pressure half-time: 1.77 cm^2. - Left atrium: Severely dilated. No evidence of thrombus in the atrial cavity or appendage. - Right atrium: The atrium was dilated. - Atrial septum: No defect or patent foramen ovale was identified. - Tricuspid valve: Moderate regurgitation. - Pulmonic valve: No evidence of vegetation. - Pulmonary arteries: Dilated.  Impressions:  - Moderate to more likely severe, calcifiic mitral stenosis - MVA 1.4-1.5 cm2 by various measures. P1/2 is 124 msec at HR of 90.  Diagnostic transesophageal echocardiography. 2D and color Doppler. Birthdate: Patient birthdate: 1946/03/19. Age: Patient is 69 yr old. Sex: Gender: female.  BMI: 45.3 kg/m^2. Blood pressure: 170/91 Patient status: Outpatient. Study date: Study date: 02/23/2015. Study time: 02:00 PM. Location: Endoscopy.  -------------------------------------------------------------------  ------------------------------------------------------------------- Left ventricle: Normal LV cavity size. Moderate concentric hypertrophy. Systolic function was normal. The estimated ejection fraction was in the range of 60% to 65%. Wall motion was normal; there were no regional wall motion abnormalities.  ------------------------------------------------------------------- Aortic valve: 21 mm St. Jude mechanical bileaflet valve. No obstruction. Trivial AI cleaning  jet.  ------------------------------------------------------------------- Aorta: Mild grade 1 atheroma.  ------------------------------------------------------------------- Mitral valve: Calcified with fixed leaflet bases and narrowed annulus. Restricted leaflet motion. There is moderate to more likely severe stenosis. AVA by planimetry was 1.43 cm2 and by VTI was 1.46 cm2. P1/2 was 124 msec, however, HR was 90 BPM at the time of the study. There is trivial regurgitation. Doppler:   Valve area by pressure half-time: 1.77 cm^2. Indexed valve area by pressure half-time: 0.76 cm^2/m^2.  ------------------------------------------------------------------- Left atrium: Severely dilated. No evidence of thrombus in the atrial cavity or appendage.  ------------------------------------------------------------------- Atrial septum: No defect or patent foramen ovale was identified.  ------------------------------------------------------------------- Right ventricle: The cavity size was normal. Wall thickness was normal. Systolic function was normal.  ------------------------------------------------------------------- Pulmonic valve:  Structurally normal valve.  Cusp separation was normal. No evidence of vegetation. Doppler: There was trivial regurgitation.  ------------------------------------------------------------------- Tricuspid valve: Moderate regurgitation.  ------------------------------------------------------------------- Pulmonary artery:  Dilated.  ------------------------------------------------------------------- Right atrium: The atrium was dilated.  ------------------------------------------------------------------- Pericardium: There was no pericardial effusion.  ------------------------------------------------------------------- Post procedure conclusions Ascending Aorta:  - Mild grade 1  atheroma.  ------------------------------------------------------------------- Measurements  Mitral valve            Value Mitral mean velocity, D  239  cm/s Mitral pressure half-time      124  ms Mitral valve area, PHT, DP     1.77 cm^2 Mitral valve area/bsa, PHT, DP   0.76 cm^2/m^2 Mitral annulus VTI, D        76.4 cm  Legend: (L) and (H) mark values outside specified reference range.  ------------------------------------------------------------------- Prepared and Electronically Authenticated by  Lyman Bishop MD 2016-05-26T12:26:28  Impression:  This 69 year old morbidly obese woman with multiple medical problems including diabetes, stage 3 CKD, OSA intolerant to CPAP, and difficulty with ambulation and multiple falls now has progression of mitral stenosis and symptoms of progressive shortness of breath, chest tightness and fatigue. Her mean gradient by 2D echo has increased from 10 mm Hg a year ago to 15 mm Hg now which is still in the moderate range. Mitral gradients by echo are sometimes inaccurate. The 3D images of her mitral valve show better leaflet movement than I would expect. I think she does need a right and left heart cath to check her coronaries and get a better idea about her left atrial and pulmonary pressures. I don't think she is a candidate for a redo sternotomy for mitral valve replacement. With an aortic valve prosthesis in place it is very difficult and sometimes impossible to expose the mitral valve and replace it. With her morbid obesity at 253 lbs I don't think it would be possible. She is also a poor operative candidate with her underlying medical problems and obesity. I told her I would get another opinion from Dr. Roxy Manns although I think minimally invasive exposure of the mitral valve would also be very difficult. I think it also unclear if she will be improved significantly by mitral valve replacement. She  has other reasons to be short of breath including diastolic heart failure unrelated to her MS, morbid obesity and restrictive lung disease, and OSA. She could potentially be a candidate for percutaneous balloon mitral valvuloplasty but that decision would be up to the cardiologist evaluating her for that. I reviewed her echo studies with her and her husband and my reasons why I don't think she is a candidate for redo sternotomy for MVR. All of their questions have been answered.  Plan:  I will ask Dr. Roxy Manns to see her for a second surgical opinion and if he feels that surgery is not indicated or not an option then she can be evaluated for PBMV.  Gaye Pollack, MD [facility] [phone]

## 2015-03-03 ENCOUNTER — Institutional Professional Consult (permissible substitution) (INDEPENDENT_AMBULATORY_CARE_PROVIDER_SITE_OTHER): Payer: Medicare Other | Admitting: Thoracic Surgery (Cardiothoracic Vascular Surgery)

## 2015-03-03 ENCOUNTER — Encounter: Payer: Self-pay | Admitting: Thoracic Surgery (Cardiothoracic Vascular Surgery)

## 2015-03-03 VITALS — BP 148/71 | HR 93 | Resp 16 | Ht 63.5 in | Wt 253.0 lb

## 2015-03-03 DIAGNOSIS — I050 Rheumatic mitral stenosis: Secondary | ICD-10-CM

## 2015-03-03 DIAGNOSIS — Z952 Presence of prosthetic heart valve: Secondary | ICD-10-CM

## 2015-03-03 DIAGNOSIS — Z954 Presence of other heart-valve replacement: Secondary | ICD-10-CM | POA: Diagnosis not present

## 2015-03-03 DIAGNOSIS — I509 Heart failure, unspecified: Secondary | ICD-10-CM | POA: Diagnosis not present

## 2015-03-03 NOTE — Progress Notes (Addendum)
[address]       [place] [address]             [phone]     CARDIOTHORACIC SURGERY CONSULTATION REPORT  Referring Provider is Gaye Pollack, MD PCP is Purvis Kilts, MD  Chief Complaint  Patient presents with  . Mitral Regurgitation    eval for MINI MVR per his request after his eval earler this week.    HPI:  Patient is a 69 year old morbidly obese female with long-standing history of chronic diastolic congestive heart failure, aortic stenosis status post aortic valve replacement in 2011, mitral stenosis, hypertension, type 2 diabetes mellitus, obstructive sleep apnea, and stage III chronic kidney disease who has been referred for a second opinion to discuss treatment options for management of mitral stenosis.  Patient has been morbidly obese for much of her adult life.  She underwent aortic valve replacement using a 21 mm St. Jude bileaflet mechanical valve in 2011 by Dr. Cyndia Bent for severe symptomatic aortic stenosis. At that time she was noted to have a calcified mitral annulus with no significant mitral stenosis and no mitral regurgitation. She has been maintained on oral warfarin therapy ever since without complication.  She has suffered from chronic exertional shortness of breath for many years.  She also has long-standing history of obstructive sleep apnea, but she has been intolerant of CPAP therapy.  In 2015 she was hospitalized following a fall complicated by a closed head injury and hematoma exacerbated by Coumadin therapy. She states that ever since that time she has gone progressively downhill with worsening shortness of breath.  Over the last several months the patient's shortness of breath has progressed further.  She developed severe bilateral lower extremity edema and was started on high-dose oral diuretic therapy by Dr. [name].  She is now on home oxygen therapy and she notes that her oxygen saturations dropped into the 80s with  ambulation. A follow-up echocardiogram was performed demonstrating normal function of the mechanical prosthesis in the aortic position but increased transvalvular gradients were reported across the mitral valve suggestive of moderate to severe mitral stenosis. The patient subsequently underwent transesophageal echocardiogram that was felt to be consistent with severe mitral stenosis. The patient was referred to Dr. Cyndia Bent for surgical consultation. She was felt to be very high-risk for any type of surgical intervention, particularly mitral valve replacement via conventional redo median sternotomy. The patient was referred for a second surgical opinion to consider possible minimally invasive approach for surgical intervention.  The patient is married and lives in [place] with her husband. She is a retired Network engineer. She has lived a sedentary life style all of her life.  She has been morbidly obese for the majority of her adult life. She admits to a several year history of gradual progression of exertional shortness of breath. She now gets short of breath with very mild activity. She also gets tearful and upset frequently. She denies history of resting shortness of breath. She cannot lie flat in bed. She has never had any chest pain or chest tightness. She has not had dizzy spells or syncope. She has had some lower extremity edema. The patient now uses oxygen at home and this does help her somewhat. She has long-standing sleep apnea but does not use CPAP.  Her mobility is very limited because of poor balance and severe obesity. She has had several falls.    Past Medical History  Diagnosis Date  .  Type 2 diabetes mellitus   . Essential hypertension   . Hyperlipidemia   . Breast cancer 11/2003  . Colonic adenoma   . OSA (obstructive sleep apnea)     AHI 45.0/hr, REM sleep was 231 mins.sleep was 77% and 84%, RDI was 21.5/hr total sleep was 2h 58 mins  . Aortic stenosis     Status post 21 mm St. Jude  Regent mechanical valve 2011   . Scalp laceration   . Traumatic subarachnoid hemorrhage 12/21/2013  . Chronic anemia   . Chronic abdominal pain   . CKD (chronic kidney disease) stage 3, GFR 30-59 ml/min   . Diastolic heart failure   . Mitral stenosis     Past Surgical History  Procedure Laterality Date  . Breast surgery  12/16/2003  . Aortic valve replacement  08/01/10    Dr Arvid Right, St Jude AVR with 21 mm St Jude regent mechanical valve  . Colonoscopy  03/04/2003    Dr. Gala Romney- internal hemorrhoids o/w normal rectum, scattered L sided diveritula  . Colonoscopy  04/12/2009    Dr. Imogene Burn polyp, L dided diverticula, tubular adenoma on bx  . Colonoscopy N/A 01/06/2013    RMR; multiple colonic/rectal polyps s/p polypectomy. Fragments of tubular adenomas, needs surveillance Oct 2014  . Colonoscopy N/A 01/18/2013    NKN:[id number] polypectomy site with bleeding, s/p bleeding control therapy  . Cardiac catheterization  05/2010    Normal coronary arteries angiographically, normal left ventricular functionultrasonographically and severe aortic stenosis by right cath. Optimal therapy would be elective aortic valve replacement  . Doppler echocardiography    . Stress myocardial perfusion    . Ultrasound guidance for vascular access    . Tee without cardioversion N/A 02/23/2015    Procedure: TRANSESOPHAGEAL ECHOCARDIOGRAM (TEE);  Surgeon: Pixie Casino, MD;  Location: [facility] ENDOSCOPY;  Service: Cardiovascular;  Laterality: N/A;    Family History  Problem Relation Age of Onset  . Heart failure Mother   . CAD    . Diabetes    . Other Brother     Knee problems  . Hypertension Brother   . Heart disease Maternal Grandmother     History   Social History  . Marital Status: Married    Spouse Name: N/A  . Number of Children: N/A  . Years of Education: N/A   Occupational History  . Not on file.   Social History Main Topics  . Smoking status: Never Smoker   . Smokeless tobacco: Never  Used  . Alcohol Use: No  . Drug Use: No  . Sexual Activity: Not Currently    Birth Control/ Protection: Post-menopausal   Other Topics Concern  . Not on file   Social History Narrative    Current Outpatient Prescriptions  Medication Sig Dispense Refill  . acetaminophen (TYLENOL) 325 MG tablet Take 2 tablets (650 mg total) by mouth every 4 (four) hours as needed for headache.    . cyanocobalamin (,VITAMIN B-12,) 1000 MCG/ML injection Inject 1,000 mcg into the muscle every 30 (thirty) days.    Mariane Baumgarten Sodium (COLACE PO) Take 1 capsule by mouth as needed (constipation).     Marland Kitchen escitalopram (LEXAPRO) 20 MG tablet Take 0.5 tablets by mouth daily.     Marland Kitchen FIBER SELECT GUMMIES CHEW Chew 1 tablet by mouth 2 (two) times daily.    . fluticasone (FLONASE) 50 MCG/ACT nasal spray Place 2 sprays into the nose daily as needed for allergies or rhinitis.     . folic acid (  FOLVITE) 1 MG tablet Take 1 mg by mouth daily.    . furosemide (LASIX) 40 MG tablet Take 1 tablet (40 mg total) by mouth daily at 3 pm. 30 tablet 0  . furosemide (LASIX) 80 MG tablet Take 1 tablet (80 mg total) by mouth every morning. 30 tablet 0  . hydrocortisone 2.5 % cream APPLY RECTALLY 3 TO 4 TIMES DAILY AS NEEDED. 30 g 2  . Insulin Lispro Prot & Lispro (HUMALOG 75/25 MIX) (75-25) 100 UNIT/ML Kwikpen Inject into the skin. 35 units in the AM 30 units in the PM    . loratadine (CLARITIN) 10 MG tablet Take 10 mg by mouth daily as needed for allergies.     Marland Kitchen LORazepam (ATIVAN) 0.5 MG tablet Take 1 tablet by mouth daily as needed.    . mupirocin cream (BACTROBAN) 2 % Apply 1 application topically daily as needed.    . warfarin (COUMADIN) 5 MG tablet Take 5-7.5 mg by mouth daily. 5 mg daily except 7.5 mg on Wednesday.     No current facility-administered medications for this visit.    Allergies  Allergen Reactions  . Cymbalta [Duloxetine Hcl] Shortness Of Breath and Other (See Comments)    Pt states she breaks out in blisters  .  Demerol Other (See Comments)    Blood pressure goes up  . Meperidine Other (See Comments)    BP fluctuations  . Naprosyn [Naproxen] Swelling    Fluid retention Legs swelling      Review of Systems:   General:  normal appetite, normal energy, no weight gain, no weight loss, no fever  Cardiac:  no chest pain with exertion, no chest pain at rest, + SOB with exertion, no resting SOB, no PND, + orthopnea, no palpitations, no arrhythmia, no atrial fibrillation, + LE edema, no dizzy spells, no syncope  Respiratory:  + shortness of breath, + home oxygen, no productive cough, + dry cough, no bronchitis, no wheezing, no hemoptysis, no asthma, no pain with inspiration or cough, + sleep apnea, no CPAP at night  GI:   no difficulty swallowing, no reflux, no frequent heartburn, no hiatal hernia, no abdominal pain, no constipation, no diarrhea, no hematochezia, no hematemesis, no melena  GU:   no dysuria,  no frequency, no urinary tract infection, no hematuria, no kidney stones, + chronic kidney disease  Vascular:  no pain suggestive of claudication, no pain in feet, no leg cramps, no varicose veins, no DVT, no non-healing foot ulcer  Neuro:   no stroke, no TIA's, no seizures, no headaches, no temporary blindness one eye,  no slurred speech, + peripheral neuropathy, no chronic pain, + instability of gait, no memory/cognitive dysfunction  Musculoskeletal: + arthritis, no joint swelling, no myalgias, + difficulty walking, limited mobility   Skin:   no rash, no itching, no skin infections, no pressure sores or ulcerations  Psych:   + anxiety, + depression, + nervousness, + unusual recent stress  Eyes:   no blurry vision, no floaters, + recent vision changes, + wears glasses or contacts  ENT:   no hearing loss, no loose or painful teeth, no dentures, last saw dentist Feb 2016  Hematologic:  + easy bruising, no abnormal bleeding, no clotting disorder, + frequent epistaxis  Endocrine:  + diabetes, does check  CBG's at home     Physical Exam:   BP 148/71 mmHg  Pulse 93  Resp 16  Ht 5' 3.5" (1.613 m)  Wt 253 lb (114.76 kg)  BMI  44.11 kg/m2  SpO2 90%  General:  Morbidly obese, chronically ill-appearing  HEENT:  Unremarkable   Neck:   no JVD, no bruits, no adenopathy   Chest:   clear to auscultation, symmetrical breath sounds, no wheezes, no rhonchi   CV:   RRR w/ mechanical heart sounds, no murmur   Abdomen:  soft, non-tender, no masses   Extremities:  warm, well-perfused, pulses not palpable at ankle, + LE edema  Rectal/GU  Deferred  Neuro:   Grossly non-focal and symmetrical throughout  Skin:   Clean and dry, no rashes, no breakdown   Diagnostic Tests:  Transthoracic Echocardiography  Patient:  Taquana, Bartley MR #:    [id number] Study Date: 02/13/2015 Gender:   F Age:    69 Height:   160 cm Weight:   117.9 kg BSA:    2.36 m^2 Pt. Status: Room:  ATTENDING  Quay Burow, MD ORDERING   Quay Burow, MD [place], MD PERFORMING  [facility], Outpatient SONOGRAPHER [facility], RDCS  cc:  ------------------------------------------------------------------- LV EF: 65% -  70%  ------------------------------------------------------------------- Indications:   Mitral Valve Disease (I05.9).  ------------------------------------------------------------------- History:  PMH: Edema, Obstructive Sleep Apnea, Status Post Aortic Valve Replacement (76mm St. Jude, 07-2010) Dyspnea. Congestive heart failure. Risk factors: Hypertension. Diabetes mellitus.  ------------------------------------------------------------------- Study Conclusions  - Left ventricle: The cavity size was normal. Wall thickness was increased in a pattern of moderate LVH. Systolic function was vigorous. The estimated ejection fraction was in the range of 65% to 70%. Wall motion was normal; there were no regional wall motion  abnormalities. The study is not technically sufficient to allow evaluation of LV diastolic function. - Aortic valve: St. Jude mechanical AVR - no evidence for obstruction. Trivial cleaning regurgitant jet. Valve area (VTI): 2.15 cm^2. Valve area (Vmax): 1.87 cm^2. Valve area (Vmean): 2.04 cm^2. - Mitral valve: Calcified annulus. Moderate to severe mitral stenosis - mean gradient of 15 mmHg. Abascal score of 14. There was trivial regurgitation. Valve area by continuity equation (using LVOT flow): 1.47 cm^2. - Left atrium: Severely dilated at 49 ml/m2. - Tricuspid valve: There was moderate regurgitation. - Pulmonary arteries: PA peak pressure: 50 mm Hg (S). - Inferior vena cava: The vessel was normal in size. The respirophasic diameter changes were in the normal range (= 50%), consistent with normal central venous pressure.  Impressions:  - Compared to the prior study, there has been an increase in the mean mitral gradient to 15 mmHg (at at HR of 85), suggesting moderate to severe stenosis. There is now moderate pulmonary hypertension. The Abascal score is 14, by my calculation, which suggests that she would not be a candidate for balloon valvotomy.  Transthoracic echocardiography. M-mode, complete 2D, spectral Doppler, and color Doppler. Birthdate: Patient birthdate: 1946/05/15. Age: Patient is 69 yr old. Sex: Gender: female. BMI: 46.1 kg/m^2. Blood pressure:   140/62 Patient status: Outpatient. Study date: Study date: 02/13/2015. Study time: 11:17 AM. Location: Echo laboratory.  -------------------------------------------------------------------  ------------------------------------------------------------------- Left ventricle: The cavity size was normal. Wall thickness was increased in a pattern of moderate LVH. Systolic function was vigorous. The estimated ejection fraction was in the range of 65% to 70%. Wall motion was normal; there  were no regional wall motion abnormalities. The study is not technically sufficient to allow evaluation of LV diastolic function.  ------------------------------------------------------------------- Aortic valve: St. Jude mechanical AVR - no evidence for obstruction. Trivial cleaning regurgitant jet. Doppler:   VTI ratio of LVOT to aortic valve: 0.69. Valve area (VTI):  2.15 cm^2. Indexed valve area (VTI): 0.91 cm^2/m^2. Peak velocity ratio of LVOT to aortic valve: 0.59. Valve area (Vmax): 1.87 cm^2. Indexed valve area (Vmax): 0.79 cm^2/m^2. Mean velocity ratio of LVOT to aortic valve: 0.65. Valve area (Vmean): 2.04 cm^2. Indexed valve area (Vmean): 0.86 cm^2/m^2.  Mean gradient (S): 16 mm Hg. Peak gradient (S): 31 mm Hg.  ------------------------------------------------------------------- Aorta: Aortic root: The aortic root was normal in size. Ascending aorta: The ascending aorta was normal in size.  ------------------------------------------------------------------- Mitral valve:  Calcified annulus. Moderate to severe mitral stenosis - mean gradient of 15 mmHg. Abascal score of 14. Doppler: There was trivial regurgitation.  Valve area by pressure half-time: 1.85 cm^2. Indexed valve area by pressure half-time: 0.78 cm^2/m^2. Valve area by continuity equation (using LVOT flow): 1.47 cm^2. Indexed valve area by continuity equation (using LVOT flow): 0.62 cm^2/m^2.  Mean gradient (D): 15 mm Hg. Peak gradient (D): 21 mm Hg.  ------------------------------------------------------------------- Left atrium: Severely dilated at 49 ml/m2.  ------------------------------------------------------------------- Right ventricle: The cavity size was normal. Wall thickness was normal. Systolic function was normal.  ------------------------------------------------------------------- Pulmonic valve:  The valve appears to be grossly normal. Doppler: There was trivial  regurgitation.  ------------------------------------------------------------------- Tricuspid valve:  Doppler: There was moderate regurgitation.  ------------------------------------------------------------------- Pulmonary artery:  The main pulmonary artery was normal-sized.  ------------------------------------------------------------------- Right atrium: The atrium was normal in size.  ------------------------------------------------------------------- Pericardium: There was no pericardial effusion.  ------------------------------------------------------------------- Systemic veins: Inferior vena cava: The vessel was normal in size. The respirophasic diameter changes were in the normal range (= 50%), consistent with normal central venous pressure. Diameter: 15.8 mm.  ------------------------------------------------------------------- Measurements  IVC                    Value     Reference ID                    15.8 mm    ---------  Left ventricle              Value     Reference LV ID, ED, PLAX chordal      (L)   36.1 mm    43 - 52 LV ID, ES, PLAX chordal      (L)   17.5 mm    23 - 38 LV fx shortening, PLAX chordal      52  %    >=29 LV PW thickness, ED            14  mm    --------- IVS/LV PW ratio, ED            0.81      <=1.3 Stroke volume, 2D             116  ml    --------- Stroke volume/bsa, 2D           49  ml/m^2  --------- LV e&', lateral              5.37 cm/s   --------- LV E/e&', lateral             43.02     --------- LV e&', medial               6.47 cm/s   --------- LV E/e&', medial              35.7      --------- LV e&', average              5.92  cm/s   --------- LV E/e&', average              39.02     ---------  Ventricular septum            Value     Reference IVS thickness, ED             11.4 mm    ---------  LVOT                   Value     Reference LVOT ID, S                20  mm    --------- LVOT area                 3.14 cm^2   --------- LVOT peak velocity, S           166  cm/s   --------- LVOT mean velocity, S           118  cm/s   --------- LVOT VTI, S                37.1 cm    --------- LVOT peak gradient, S           11  mm Hg  ---------  Aortic valve               Value     Reference Aortic valve peak velocity, S       279  cm/s   --------- Aortic valve mean velocity, S       182  cm/s   --------- Aortic valve VTI, S            54.1 cm    --------- Aortic mean gradient, S          16  mm Hg  --------- Aortic peak gradient, S          31  mm Hg  --------- VTI ratio, LVOT/AV            0.69      --------- Aortic valve area, VTI          2.15 cm^2   --------- Aortic valve area/bsa, VTI        0.91 cm^2/m^2 --------- Velocity ratio, peak, LVOT/AV       0.59      --------- Aortic valve area, peak velocity     1.87 cm^2   --------- Aortic valve area/bsa, peak        0.79 cm^2/m^2 --------- velocity Velocity ratio, mean, LVOT/AV       0.65      --------- Aortic valve area, mean velocity     2.04 cm^2   --------- Aortic valve area/bsa, mean        0.86 cm^2/m^2 --------- velocity  Aorta                   Value     Reference Aortic root ID, ED            30  mm    ---------  Left atrium                Value     Reference LA ID,  A-P, ES              41  mm    --------- LA ID/bsa, A-P              1.74  cm/m^2  <=2.2 LA volume, S               107  ml    --------- LA volume/bsa, S             45.3 ml/m^2  --------- LA volume, ES, 1-p A4C          96  ml    --------- LA volume/bsa, ES, 1-p A4C        40.7 ml/m^2  --------- LA volume, ES, 1-p A2C          106  ml    --------- LA volume/bsa, ES, 1-p A2C        44.9 ml/m^2  ---------  Mitral valve               Value     Reference Mitral E-wave peak velocity        231  cm/s   --------- Mitral A-wave peak velocity        246  cm/s   --------- Mitral mean velocity, D          180  cm/s   --------- Mitral deceleration time     (H)   420  ms    150 - 230 Mitral pressure half-time         119  ms    --------- Mitral mean gradient, D          15  mm Hg  --------- Mitral peak gradient, D          21  mm Hg  --------- Mitral E/A ratio, peak          0.94      --------- Mitral valve area, PHT, DP        1.85 cm^2   --------- Mitral valve area/bsa, PHT, DP      0.78 cm^2/m^2 --------- Mitral valve area, LVOT          1.47 cm^2   --------- continuity Mitral valve area/bsa, LVOT        0.62 cm^2/m^2 --------- continuity Mitral annulus VTI, D           79.5 cm    ---------  Pulmonary arteries            Value     Reference PA pressure, S, DP        (H)   50  mm Hg  <=30  Tricuspid valve              Value     Reference Tricuspid regurg peak velocity      341  cm/s   --------- Tricuspid peak RV-RA gradient       47  mm Hg  ---------  Right ventricle              Value      Reference RV s&', lateral, S             7.68 cm/s   ---------  Legend: (L) and (H) mark values outside specified reference range.  ------------------------------------------------------------------- Prepared and Electronically Authenticated by  Lyman Bishop MD 2016-05-16T18:24:28    Transesophageal Echocardiography  Patient:  Roizy, Harold MR #:    [id number] Study Date: 02/23/2015 Gender:   F Age:    78 Height:   160 cm Weight:   116.1 kg BSA:    2.34 m^2 Pt. Status: Room:  ADMITTING  Lyman Bishop MD ATTENDING  Lyman Bishop MD [facility]   Lyman Bishop MD  PERFORMING  Lyman Bishop MD SONOGRAPHER Johny Chess, RDCS, CCT  cc:  ------------------------------------------------------------------- LV EF: 60% -  65%  ------------------------------------------------------------------- Indications:   Mitral stenosis [non-rheumatic] 424.0.  ------------------------------------------------------------------- Study Conclusions  - Left ventricle: Systolic function was normal. The estimated ejection fraction was in the range of 60% to 65%. Wall motion was normal; there were no regional wall motion abnormalities. - Aortic valve: 21 mm St. Jude mechanical bileaflet valve. No obstruction. Trivial AI cleaning jet. - Aorta: Mild grade 1 atheroma. - Mitral valve: Calcified with fixed leaflet bases and narrowed annulus. Restricted leaflet motion. There is moderate to more likely severe stenosis. AVA by planimetry was 1.43 cm2 and by VTI was 1.46 cm2. P1/2 was 124 msec, however, HR was 90 BPM at the time of the study. There is trivial regurgitation. Valve area by pressure half-time: 1.77 cm^2. - Left atrium: Severely dilated. No evidence of thrombus in the atrial cavity or appendage. - Right atrium: The atrium was dilated. - Atrial septum: No defect or patent foramen ovale was identified. -  Tricuspid valve: Moderate regurgitation. - Pulmonic valve: No evidence of vegetation. - Pulmonary arteries: Dilated.  Impressions:  - Moderate to more likely severe, calcifiic mitral stenosis - MVA 1.4-1.5 cm2 by various measures. P1/2 is 124 msec at HR of 90.  Diagnostic transesophageal echocardiography. 2D and color Doppler. Birthdate: Patient birthdate: 19-Jan-1946. Age: Patient is 69 yr old. Sex: Gender: female.  BMI: 45.3 kg/m^2. Blood pressure: 170/91 Patient status: Outpatient. Study date: Study date: 02/23/2015. Study time: 02:00 PM. Location: Endoscopy.  -------------------------------------------------------------------  ------------------------------------------------------------------- Left ventricle: Normal LV cavity size. Moderate concentric hypertrophy. Systolic function was normal. The estimated ejection fraction was in the range of 60% to 65%. Wall motion was normal; there were no regional wall motion abnormalities.  ------------------------------------------------------------------- Aortic valve: 21 mm St. Jude mechanical bileaflet valve. No obstruction. Trivial AI cleaning jet.  ------------------------------------------------------------------- Aorta: Mild grade 1 atheroma.  ------------------------------------------------------------------- Mitral valve: Calcified with fixed leaflet bases and narrowed annulus. Restricted leaflet motion. There is moderate to more likely severe stenosis. AVA by planimetry was 1.43 cm2 and by VTI was 1.46 cm2. P1/2 was 124 msec, however, HR was 90 BPM at the time of the study. There is trivial regurgitation. Doppler:   Valve area by pressure half-time: 1.77 cm^2. Indexed valve area by pressure half-time: 0.76 cm^2/m^2.  ------------------------------------------------------------------- Left atrium: Severely dilated. No evidence of thrombus in the atrial cavity or  appendage.  ------------------------------------------------------------------- Atrial septum: No defect or patent foramen ovale was identified.  ------------------------------------------------------------------- Right ventricle: The cavity size was normal. Wall thickness was normal. Systolic function was normal.  ------------------------------------------------------------------- Pulmonic valve:  Structurally normal valve.  Cusp separation was normal. No evidence of vegetation. Doppler: There was trivial regurgitation.  ------------------------------------------------------------------- Tricuspid valve: Moderate regurgitation.  ------------------------------------------------------------------- Pulmonary artery:  Dilated.  ------------------------------------------------------------------- Right atrium: The atrium was dilated.  ------------------------------------------------------------------- Pericardium: There was no pericardial effusion.  ------------------------------------------------------------------- Post procedure conclusions Ascending Aorta:  - Mild grade 1 atheroma.  ------------------------------------------------------------------- Measurements  Mitral valve            Value Mitral mean velocity, D       239  cm/s Mitral pressure half-time      124  ms Mitral valve area, PHT, DP     1.77 cm^2 Mitral valve area/bsa, PHT, DP   0.76 cm^2/m^2 Mitral annulus VTI, D        76.4 cm  Legend: (L) and (H) mark values outside specified reference range.  ------------------------------------------------------------------- Prepared and  Electronically Authenticated by  Lyman Bishop MD 2016-05-26T12:26:28   Impression:  I have personally reviewed the patient's recent transthoracic and transesophageal echocardiograms.  She has mitral stenosis that may be moderate in severity.  However, I do not  feel that her transthoracic or transesophageal echocardiogram findings are consistent with severe mitral stenosis.  In fact, the functional anatomy of the mitral valve is not consistent with underlying rheumatic disease and transesophageal echocardiogram clearly demonstrates reasonable mobility of both the anterior and the posterior leaflets of the mitral valve. There is severe calcification of the mitral annulus that extends into both leaflets, but both are still moving with relatively mild restriction.  Remarkably, there is no significant mitral regurgitation.  The patient's bileaflet mechanical valve in the aortic position is functioning normally with a peak velocity across the aortic valve measured 2.8 m/s corresponding to a mean transvalvular gradient of 16 mmHg which is normal for a 21 mm bileaflet valve.  The patient has long-standing chronic diastolic congestive heart failure that is undoubtedly multifactorial and related to her valvular heart disease, morbid obesity, and sleep apnea with likely pulmonary hypertension.  I do not feel that mitral valve replacement is indicated at this time, and I do not think that mitral valve replacement would likely improve her quality of life.  There is no reason to believe that mitral valve replacement would prolong her life at all, and because of the high associated surgical risks it might shorten it somewhat suddenly.   Plan:  I've discussed matters at length with the patient and her husband in the office today. I've explained that I do not feel that mitral valve replacement would be a good idea at this time. I think the risks of surgery would be high and I am skeptical that it would help her to any significant degree.  If there remain any questions regarding the severity of her mitral stenosis, left and right heart catheterization could be considered with transseptal puncture to directly measure transvalvular gradients. Cardiopulmonary stress testing could be  considered to further sort out the causes of the patient's chronic dyspnea, although I am skeptical that she could tolerate a CPX test.  Even if the patient had clear evidence for the presence of severe mitral stenosis due to rheumatic disease, I would remain very reluctant to consider this patient a candidate for conventional surgery.  If she did have severe mitral stenosis I would be more likely to consider balloon mitral valvuloplasty as a less invasive option.  In the immediate future I think the patient would benefit more from participation in an aggressive weight loss and exercise program, close follow up for management of chronic CHF, and use of CPAP to treat her sleep apnea.  She will continue to follow-up with Dr. Gwenlyn Found and call to return to see [place] here at [facility] in the future as needed.   I spent in excess of 90 minutes during the conduct of this office consultation and >50% of this time involved direct face-to-face encounter with the patient for counseling and/or coordination of their care.   Valentina Gu. Roxy Manns, MD 03/03/2015 9:10 PM

## 2015-03-07 ENCOUNTER — Ambulatory Visit: Payer: Medicare Other | Admitting: Urology

## 2015-03-17 DIAGNOSIS — J811 Chronic pulmonary edema: Secondary | ICD-10-CM | POA: Diagnosis not present

## 2015-03-17 DIAGNOSIS — Z6841 Body Mass Index (BMI) 40.0 and over, adult: Secondary | ICD-10-CM | POA: Diagnosis not present

## 2015-03-17 DIAGNOSIS — I050 Rheumatic mitral stenosis: Secondary | ICD-10-CM | POA: Diagnosis not present

## 2015-03-17 DIAGNOSIS — D510 Vitamin B12 deficiency anemia due to intrinsic factor deficiency: Secondary | ICD-10-CM | POA: Diagnosis not present

## 2015-03-17 DIAGNOSIS — Z23 Encounter for immunization: Secondary | ICD-10-CM | POA: Diagnosis not present

## 2015-03-17 DIAGNOSIS — F321 Major depressive disorder, single episode, moderate: Secondary | ICD-10-CM | POA: Diagnosis not present

## 2015-03-18 ENCOUNTER — Encounter (HOSPITAL_COMMUNITY): Payer: Self-pay | Admitting: Emergency Medicine

## 2015-03-18 ENCOUNTER — Emergency Department (HOSPITAL_COMMUNITY)
Admission: EM | Admit: 2015-03-18 | Discharge: 2015-03-18 | Disposition: A | Discharge status: Home / Self Care | Payer: Medicare Other | Attending: Emergency Medicine | Admitting: Emergency Medicine

## 2015-03-18 ENCOUNTER — Emergency Department (HOSPITAL_COMMUNITY): Payer: Medicare Other

## 2015-03-18 DIAGNOSIS — D649 Anemia, unspecified: Secondary | ICD-10-CM | POA: Insufficient documentation

## 2015-03-18 DIAGNOSIS — S0101XA Laceration without foreign body of scalp, initial encounter: Secondary | ICD-10-CM | POA: Diagnosis not present

## 2015-03-18 DIAGNOSIS — W19XXXA Unspecified fall, initial encounter: Secondary | ICD-10-CM

## 2015-03-18 DIAGNOSIS — N183 Chronic kidney disease, stage 3 (moderate): Secondary | ICD-10-CM | POA: Diagnosis not present

## 2015-03-18 DIAGNOSIS — S0990XA Unspecified injury of head, initial encounter: Secondary | ICD-10-CM | POA: Diagnosis not present

## 2015-03-18 DIAGNOSIS — Z7901 Long term (current) use of anticoagulants: Secondary | ICD-10-CM | POA: Diagnosis not present

## 2015-03-18 DIAGNOSIS — Y92009 Unspecified place in unspecified non-institutional (private) residence as the place of occurrence of the external cause: Secondary | ICD-10-CM | POA: Insufficient documentation

## 2015-03-18 DIAGNOSIS — Y998 Other external cause status: Secondary | ICD-10-CM | POA: Diagnosis not present

## 2015-03-18 DIAGNOSIS — I129 Hypertensive chronic kidney disease with stage 1 through stage 4 chronic kidney disease, or unspecified chronic kidney disease: Secondary | ICD-10-CM | POA: Insufficient documentation

## 2015-03-18 DIAGNOSIS — Y9301 Activity, walking, marching and hiking: Secondary | ICD-10-CM | POA: Diagnosis not present

## 2015-03-18 DIAGNOSIS — Z8669 Personal history of other diseases of the nervous system and sense organs: Secondary | ICD-10-CM | POA: Insufficient documentation

## 2015-03-18 DIAGNOSIS — Z86018 Personal history of other benign neoplasm: Secondary | ICD-10-CM | POA: Diagnosis not present

## 2015-03-18 DIAGNOSIS — Z794 Long term (current) use of insulin: Secondary | ICD-10-CM | POA: Diagnosis not present

## 2015-03-18 DIAGNOSIS — Z79899 Other long term (current) drug therapy: Secondary | ICD-10-CM | POA: Diagnosis not present

## 2015-03-18 DIAGNOSIS — W01198A Fall on same level from slipping, tripping and stumbling with subsequent striking against other object, initial encounter: Secondary | ICD-10-CM | POA: Diagnosis not present

## 2015-03-18 DIAGNOSIS — R51 Headache: Secondary | ICD-10-CM | POA: Diagnosis not present

## 2015-03-18 DIAGNOSIS — I5030 Unspecified diastolic (congestive) heart failure: Secondary | ICD-10-CM | POA: Insufficient documentation

## 2015-03-18 DIAGNOSIS — G8929 Other chronic pain: Secondary | ICD-10-CM | POA: Insufficient documentation

## 2015-03-18 DIAGNOSIS — E119 Type 2 diabetes mellitus without complications: Secondary | ICD-10-CM | POA: Insufficient documentation

## 2015-03-18 DIAGNOSIS — Z853 Personal history of malignant neoplasm of breast: Secondary | ICD-10-CM | POA: Insufficient documentation

## 2015-03-18 MED ORDER — LIDOCAINE-EPINEPHRINE (PF) 1 %-1:200000 IJ SOLN
10.0000 mL | Freq: Once | INTRAMUSCULAR | Status: AC
  Administered 2015-03-18: 10 mL
  Filled 2015-03-18: qty 10

## 2015-03-18 NOTE — ED Notes (Signed)
MD at bedside. 

## 2015-03-18 NOTE — ED Notes (Signed)
Pt reports was walking with her walker. Pt reports tried to push walker and lost balance. Pt reports fell and hit head. Pt denies loc but reports is on coumadin. Minimal bleeding to posterior head laceration. Pt alert and oriented.

## 2015-03-18 NOTE — ED Notes (Signed)
Lidocaine at bedside.

## 2015-03-18 NOTE — Discharge Instructions (Signed)

## 2015-03-18 NOTE — ED Provider Notes (Signed)
CSN: [id number]     Arrival date & time 03/18/15  1407 History   First MD Initiated Contact with Patient 03/18/15 1409     Chief Complaint  Patient presents with  . Fall     (Consider location/radiation/quality/duration/timing/severity/associated sxs/prior Treatment) HPI Comments: Patient presents to the ER for evaluation after a fall. Patient reports that she was walking into her home and the walker got caught on the threshold causing her to fall backwards. She did hit the back of her head on the ground, no loss of consciousness. Complains of mild headache. She is, however, on Coumadin. There was a large amount of bleeding initially, but her pressure has stopped bleeding. She does not have any neck or back pain. Denies extremity pain and injury.  Patient is a 69 y.o. female presenting with fall.  Fall Associated symptoms include headaches.    Past Medical History  Diagnosis Date  . Type 2 diabetes mellitus   . Essential hypertension   . Hyperlipidemia   . Breast cancer 11/2003  . Colonic adenoma   . OSA (obstructive sleep apnea)     AHI 45.0/hr, REM sleep was 231 mins.sleep was 77% and 84%, RDI was 21.5/hr total sleep was 2h 58 mins  . Aortic stenosis     Status post 21 mm St. Jude Regent mechanical valve 2011   . Scalp laceration   . Traumatic subarachnoid hemorrhage 12/21/2013  . Chronic anemia   . Chronic abdominal pain   . CKD (chronic kidney disease) stage 3, GFR 30-59 ml/min   . Diastolic heart failure   . Mitral stenosis    Past Surgical History  Procedure Laterality Date  . Breast surgery  12/16/2003  . Aortic valve replacement  08/01/10    Dr Arvid Right, St Jude AVR with 21 mm St Jude regent mechanical valve  . Colonoscopy  03/04/2003    Dr. Gala Romney- internal hemorrhoids o/w normal rectum, scattered L sided diveritula  . Colonoscopy  04/12/2009    Dr. Imogene Burn polyp, L dided diverticula, tubular adenoma on bx  . Colonoscopy N/A 01/06/2013    RMR; multiple  colonic/rectal polyps s/p polypectomy. Fragments of tubular adenomas, needs surveillance Oct 2014  . Colonoscopy N/A 01/18/2013    ATF:TDDUKG polypectomy site with bleeding, s/p bleeding control therapy  . Cardiac catheterization  05/2010    Normal coronary arteries angiographically, normal left ventricular functionultrasonographically and severe aortic stenosis by right cath. Optimal therapy would be elective aortic valve replacement  . Doppler echocardiography    . Stress myocardial perfusion    . Ultrasound guidance for vascular access    . Tee without cardioversion N/A 02/23/2015    Procedure: TRANSESOPHAGEAL ECHOCARDIOGRAM (TEE);  Surgeon: Pixie Casino, MD;  Location: [facility] ENDOSCOPY;  Service: Cardiovascular;  Laterality: N/A;   Family History  Problem Relation Age of Onset  . Heart failure Mother   . CAD    . Diabetes    . Other Brother     Knee problems  . Hypertension Brother   . Heart disease Maternal Grandmother    History  Substance Use Topics  . Smoking status: Never Smoker   . Smokeless tobacco: Never Used  . Alcohol Use: No   OB History    Gravida Para Term Preterm AB TAB SAB Ectopic Multiple Living            0     Review of Systems  Skin: Positive for wound.  Neurological: Positive for headaches. Negative for syncope.  All  other systems reviewed and are negative.     Allergies  Cymbalta; Demerol; Meperidine; and Naprosyn  Home Medications   Prior to Admission medications   Medication Sig Start Date End Date Taking? Authorizing Provider  acetaminophen (TYLENOL) 500 MG tablet Take 250-500 mg by mouth every 6 (six) hours as needed.   Yes Historical Provider, MD  cyanocobalamin (,VITAMIN B-12,) 1000 MCG/ML injection Inject 1,000 mcg into the muscle every 30 (thirty) days.   Yes Historical Provider, MD  escitalopram (LEXAPRO) 20 MG tablet Take 20 mg by mouth daily.  12/23/14  Yes Historical Provider, MD  FIBER SELECT GUMMIES CHEW Chew 1 tablet by mouth as  needed.    Yes Historical Provider, MD  furosemide (LASIX) 80 MG tablet Take 1 tablet (80 mg total) by mouth every morning. Patient taking differently: Take 40-80 mg by mouth every morning. Takes 80 mg in the morning and 40 mg in the evening. 05/18/14  Yes Belkys A Regalado, MD  hydrocortisone 2.5 % cream APPLY RECTALLY 3 TO 4 TIMES DAILY AS NEEDED. 09/05/14  Yes Estill Dooms, NP  Insulin Lispro Prot & Lispro (HUMALOG 75/25 MIX) (75-25) 100 UNIT/ML Kwikpen Inject 35 Units into the skin 2 (two) times daily. In the evening patient injects according to sliding scale at home between 15-35 units.   Yes Historical Provider, MD  LORazepam (ATIVAN) 0.5 MG tablet Take 0.25 mg by mouth at bedtime.  11/12/14  Yes Historical Provider, MD  mupirocin cream (BACTROBAN) 2 % Apply 1 application topically daily as needed.   Yes Historical Provider, MD  warfarin (COUMADIN) 5 MG tablet Take 5-7.5 mg by mouth daily. 5 mg daily except 7.5 mg on Wednesday.   Yes Historical Provider, MD  Docusate Sodium (COLACE PO) Take 1 capsule by mouth at bedtime.     Historical Provider, MD  fluticasone (FLONASE) 50 MCG/ACT nasal spray Place 2 sprays into the nose daily as needed for allergies or rhinitis.     Historical Provider, MD  furosemide (LASIX) 40 MG tablet Take 1 tablet (40 mg total) by mouth daily at 3 pm. 05/18/14   Belkys A Regalado, MD   BP 114/50 mmHg  Pulse 75  Temp(Src) 98.4 F (36.9 C) (Oral)  Resp 18  Ht 5' 3.5" (1.613 m)  Wt 235 lb (106.595 kg)  BMI 40.97 kg/m2  SpO2 90% Physical Exam  Constitutional: She is oriented to person, place, and time. She appears well-developed and well-nourished. No distress.  HENT:  Head: Normocephalic. Head is with contusion and with laceration.    Right Ear: Hearing normal.  Left Ear: Hearing normal.  Nose: Nose normal.  Mouth/Throat: Oropharynx is clear and moist and mucous membranes are normal.  Eyes: Conjunctivae and EOM are normal. Pupils are equal, round, and  reactive to light.  Neck: Normal range of motion. Neck supple. No spinous process tenderness and no muscular tenderness present. Normal range of motion present.  Cardiovascular: Regular rhythm, S1 normal and S2 normal.  Exam reveals no gallop and no friction rub.   No murmur heard. Pulmonary/Chest: Effort normal and breath sounds normal. No respiratory distress. She exhibits no tenderness.  Abdominal: Soft. Normal appearance and bowel sounds are normal. There is no hepatosplenomegaly. There is no tenderness. There is no rebound, no guarding, no tenderness at McBurney's point and negative Murphy's sign. No hernia.  Musculoskeletal: Normal range of motion.       Thoracic back: Normal.       Lumbar back: Normal.  Neurological: She  is alert and oriented to person, place, and time. She has normal strength. No cranial nerve deficit or sensory deficit. Coordination normal. GCS eye subscore is 4. GCS verbal subscore is 5. GCS motor subscore is 6.  Skin: Skin is warm, dry and intact. No rash noted. No cyanosis.  Psychiatric: She has a normal mood and affect. Her speech is normal and behavior is normal. Thought content normal.  Nursing note and vitals reviewed.   ED Course  Procedures (including critical care time)  LACERATION REPAIR Performed by: Orpah Greek. Authorized by: Orpah Greek Consent: Verbal consent obtained. Risks and benefits: risks, benefits and alternatives were discussed Consent given by: patient Patient identity confirmed: provided demographic data Prepped and Draped in normal sterile fashion Wound explored  Laceration Location: Scalp  Laceration Length: 4cm  No Foreign Bodies seen or palpated  Anesthesia: local infiltration  Local anesthetic: lidocaine 1% with epinephrine  Anesthetic total: 3 ml  Irrigation method: syringe Amount of cleaning: standard  Skin closure: Staples   Number of staples: 7  Patient tolerance: Patient tolerated the  procedure well with no immediate complications.   Labs Review Labs Reviewed - No data to display  Imaging Review Ct Head Wo Contrast  03/18/2015   CLINICAL DATA:  Pain following fall  EXAM: CT HEAD WITHOUT CONTRAST  TECHNIQUE: Contiguous axial images were obtained from the base of the skull through the vertex without intravenous contrast.  COMPARISON:  May 16, 2014  FINDINGS: There is age related volume loss. There is no intracranial mass, hemorrhage, extra-axial fluid collection, or midline shift. There is mild small vessel disease in the centra semiovale bilaterally. Elsewhere gray-white compartments appear normal. No acute infarct apparent. There is a superior left parietal scalp hematoma. Bony calvarium appears intact although somewhat osteoporotic. Mastoid air cells are clear.  IMPRESSION: Age related volume loss with mild periventricular small vessel disease. No intracranial mass, hemorrhage, or extra-axial fluid collection. No acute appearing infarct. High left frontal scalp hematoma posteriorly.   Electronically Signed   By: Lowella Grip III M.D.   On: 03/18/2015 15:29     EKG Interpretation None      MDM   Final diagnoses:  Fall  Scalp laceration, initial encounter   Presents to the ER for evaluation of laceration in the posterior aspect of her scalp. Patient had a mechanical fall, hitting the back of her head. There was no loss of consciousness. She does have a history of anticoagulation with Coumadin, however. Patient had a normal neurologic evaluation. She does not have any significant headache, but CT scan was performed because of anticoagulation. CT scan did not show any acute intracranial injury. Scalp laceration was cleaned and closed with staples. Staple removal in 10 days at primary doctor's office.    Orpah Greek, MD 03/18/15 1544

## 2015-03-26 ENCOUNTER — Other Ambulatory Visit: Payer: Self-pay | Admitting: Cardiovascular Disease

## 2015-03-27 ENCOUNTER — Other Ambulatory Visit: Payer: Self-pay

## 2015-03-27 NOTE — Telephone Encounter (Signed)
Rx(s) sent to pharmacy electronically.  

## 2015-03-29 DIAGNOSIS — Z6841 Body Mass Index (BMI) 40.0 and over, adult: Secondary | ICD-10-CM | POA: Diagnosis not present

## 2015-03-29 DIAGNOSIS — Z1389 Encounter for screening for other disorder: Secondary | ICD-10-CM | POA: Diagnosis not present

## 2015-03-29 DIAGNOSIS — Z4802 Encounter for removal of sutures: Secondary | ICD-10-CM | POA: Diagnosis not present

## 2015-03-29 DIAGNOSIS — I059 Rheumatic mitral valve disease, unspecified: Secondary | ICD-10-CM | POA: Diagnosis not present

## 2015-04-02 DIAGNOSIS — I509 Heart failure, unspecified: Secondary | ICD-10-CM | POA: Diagnosis not present

## 2015-04-05 ENCOUNTER — Ambulatory Visit (INDEPENDENT_AMBULATORY_CARE_PROVIDER_SITE_OTHER): Payer: Medicare Other | Admitting: *Deleted

## 2015-04-05 DIAGNOSIS — Z952 Presence of prosthetic heart valve: Secondary | ICD-10-CM

## 2015-04-05 DIAGNOSIS — Z954 Presence of other heart-valve replacement: Secondary | ICD-10-CM | POA: Diagnosis not present

## 2015-04-05 DIAGNOSIS — Z7901 Long term (current) use of anticoagulants: Secondary | ICD-10-CM

## 2015-04-05 LAB — POCT INR: INR: 3.2

## 2015-04-06 DIAGNOSIS — Z0000 Encounter for general adult medical examination without abnormal findings: Secondary | ICD-10-CM | POA: Diagnosis not present

## 2015-04-06 DIAGNOSIS — Z6841 Body Mass Index (BMI) 40.0 and over, adult: Secondary | ICD-10-CM | POA: Diagnosis not present

## 2015-04-07 DIAGNOSIS — L570 Actinic keratosis: Secondary | ICD-10-CM | POA: Diagnosis not present

## 2015-04-07 DIAGNOSIS — L820 Inflamed seborrheic keratosis: Secondary | ICD-10-CM | POA: Diagnosis not present

## 2015-04-07 DIAGNOSIS — L739 Follicular disorder, unspecified: Secondary | ICD-10-CM | POA: Diagnosis not present

## 2015-04-07 DIAGNOSIS — D485 Neoplasm of uncertain behavior of skin: Secondary | ICD-10-CM | POA: Diagnosis not present

## 2015-04-10 DIAGNOSIS — N183 Chronic kidney disease, stage 3 (moderate): Secondary | ICD-10-CM | POA: Diagnosis not present

## 2015-04-10 DIAGNOSIS — E1129 Type 2 diabetes mellitus with other diabetic kidney complication: Secondary | ICD-10-CM | POA: Diagnosis not present

## 2015-04-10 DIAGNOSIS — D631 Anemia in chronic kidney disease: Secondary | ICD-10-CM | POA: Diagnosis not present

## 2015-04-10 DIAGNOSIS — I129 Hypertensive chronic kidney disease with stage 1 through stage 4 chronic kidney disease, or unspecified chronic kidney disease: Secondary | ICD-10-CM | POA: Diagnosis not present

## 2015-04-11 ENCOUNTER — Ambulatory Visit (INDEPENDENT_AMBULATORY_CARE_PROVIDER_SITE_OTHER): Payer: Medicare Other | Admitting: Adult Health

## 2015-04-11 ENCOUNTER — Encounter: Payer: Self-pay | Admitting: Adult Health

## 2015-04-11 VITALS — BP 128/58 | HR 92 | Ht 63.5 in | Wt 254.5 lb

## 2015-04-11 DIAGNOSIS — N61 Inflammatory disorders of breast: Secondary | ICD-10-CM | POA: Diagnosis not present

## 2015-04-11 DIAGNOSIS — N611 Abscess of the breast and nipple: Secondary | ICD-10-CM | POA: Insufficient documentation

## 2015-04-11 HISTORY — DX: Abscess of the breast and nipple: N61.1

## 2015-04-11 MED ORDER — AMOXICILLIN-POT CLAVULANATE 875-125 MG PO TABS: 1.0000 | ORAL_TABLET | Freq: Two times a day (BID) | ORAL | Status: DC

## 2015-04-11 NOTE — Progress Notes (Signed)
Subjective:     Patient ID: Hailey Curry, female   DOB: [birth year], 69 y.o.   MRN: [id number]  HPI [name] is a 69 year old white female in complaining of spot on left breast for about a week, she had taken Cymbalta and had blisters come up , so she stopped it, not sure if related, first thought was bug bite, has normal mammogram in the fall.She is a little teary today, has had some increasing health issues.Has seen Dr [name] and has meds changed for moods and has seen cardiologist recent and has had meds changed there also.Is using walker for stability, fell several weeks ago and had to get stitches.  Review of Systems  Patient denies any headaches, hearing loss, fatigue, blurred vision, shortness of breath, chest pain, abdominal pain, problems with bowel movements(takes stool softener, is constipated at times), urination, or intercourse. No joint pain.See HPI for positives.  Reviewed past medical,surgical, social and family history. Reviewed medications and allergies.     Objective:   Physical Exam BP 128/58 mmHg  Pulse 92  Ht 5' 3.5" (1.613 m)  Wt 254 lb 8 oz (115.44 kg)  BMI 44.37 kg/m2    Skin warm and dry,  Breasts:no dominate palpable mass, retraction or nipple discharge on right has scar 10-12 o'clock, on left no retraction or nipple discharge, has grape sized area at about 4 o'clock 4 fb from nipple that is bluish purple, not felt to be in breast tissue.  Assessment:     Left breast abscess    Plan:     Rx augmentin 875-125 mg #28 take 1 bid x 14 days Follow up in 2 weeks for recheck, may get mammogram/US if not reolved Review handout on abscess

## 2015-04-11 NOTE — Patient Instructions (Signed)
Abscess An abscess is an infected area that contains a collection of pus and debris.It can occur in almost any part of the body. An abscess is also known as a furuncle or boil. CAUSES  An abscess occurs when tissue gets infected. This can occur from blockage of oil or sweat glands, infection of hair follicles, or a minor injury to the skin. As the body tries to fight the infection, pus collects in the area and creates pressure under the skin. This pressure causes pain. People with weakened immune systems have difficulty fighting infections and get certain abscesses more often.  SYMPTOMS Usually an abscess develops on the skin and becomes a painful mass that is red, warm, and tender. If the abscess forms under the skin, you may feel a moveable soft area under the skin. Some abscesses break open (rupture) on their own, but most will continue to get worse without care. The infection can spread deeper into the body and eventually into the bloodstream, causing you to feel ill.  DIAGNOSIS  Your caregiver will take your medical history and perform a physical exam. A sample of fluid may also be taken from the abscess to determine what is causing your infection. TREATMENT  Your caregiver may prescribe antibiotic medicines to fight the infection. However, taking antibiotics alone usually does not cure an abscess. Your caregiver may need to make a small cut (incision) in the abscess to drain the pus. In some cases, gauze is packed into the abscess to reduce pain and to continue draining the area. HOME CARE INSTRUCTIONS   Only take over-the-counter or prescription medicines for pain, discomfort, or fever as directed by your caregiver.  If you were prescribed antibiotics, take them as directed. Finish them even if you start to feel better.  If gauze is used, follow your caregiver's directions for changing the gauze.  To avoid spreading the infection:  Keep your draining abscess covered with a  bandage.  Wash your hands well.  Do not share personal care items, towels, or whirlpools with others.  Avoid skin contact with others.  Keep your skin and clothes clean around the abscess.  Keep all follow-up appointments as directed by your caregiver. SEEK MEDICAL CARE IF:   You have increased pain, swelling, redness, fluid drainage, or bleeding.  You have muscle aches, chills, or a general ill feeling.  You have a fever. MAKE SURE YOU:   Understand these instructions.  Will watch your condition.  Will get help right away if you are not doing well or get worse. Document Released: 06/26/2005 Document Revised: 03/17/2012 Document Reviewed: 11/29/2011 [facility] Patient Information 2015 [place], [place]. This information is not intended to replace advice given to you by your health care provider. Make sure you discuss any questions you have with your health care provider. Take augmentin 1 bid x 14 days Follow up in 2 weeks

## 2015-04-13 ENCOUNTER — Telehealth: Payer: Self-pay | Admitting: Cardiovascular Disease

## 2015-04-13 NOTE — Telephone Encounter (Signed)
Received records from [facility] for appointment on 04/25/15 with Dr Gwenlyn Found.  Records given to D Ricci Barker (medical records) for Dr Kennon Holter schedule on 04/25/15. lp

## 2015-04-14 ENCOUNTER — Telehealth: Payer: Self-pay | Admitting: *Deleted

## 2015-04-14 NOTE — Telephone Encounter (Signed)
Doubt it is causing fluid retention constipation possible  However not really good alternatives for breast abscess, need to stay on augmentin

## 2015-04-14 NOTE — Telephone Encounter (Signed)
Spoke with pt. Pt was started on Augmentin 04/11/15 and it's causing constipation and causing her to retain fluid. She was put on this for a breast abscess. She is requesting something different. Please advise. Thanks!! [place]

## 2015-04-17 NOTE — Telephone Encounter (Signed)
Pt said she cut back  To once a day but will keep taking it.

## 2015-04-18 ENCOUNTER — Ambulatory Visit: Payer: Medicare Other | Admitting: Urology

## 2015-04-25 ENCOUNTER — Ambulatory Visit: Payer: Medicare Other | Admitting: Adult Health

## 2015-04-25 ENCOUNTER — Encounter: Payer: Self-pay | Admitting: Cardiovascular Disease

## 2015-04-25 ENCOUNTER — Ambulatory Visit (INDEPENDENT_AMBULATORY_CARE_PROVIDER_SITE_OTHER): Payer: Medicare Other | Admitting: Cardiovascular Disease

## 2015-04-25 VITALS — BP 128/64 | HR 65 | Ht 63.5 in | Wt 253.1 lb

## 2015-04-25 DIAGNOSIS — Z952 Presence of prosthetic heart valve: Secondary | ICD-10-CM

## 2015-04-25 DIAGNOSIS — I050 Rheumatic mitral stenosis: Secondary | ICD-10-CM

## 2015-04-25 DIAGNOSIS — I5032 Chronic diastolic (congestive) heart failure: Secondary | ICD-10-CM

## 2015-04-25 DIAGNOSIS — I10 Essential (primary) hypertension: Secondary | ICD-10-CM

## 2015-04-25 DIAGNOSIS — Z954 Presence of other heart-valve replacement: Secondary | ICD-10-CM

## 2015-04-25 NOTE — Assessment & Plan Note (Signed)
History of hypertension with blood pressure measured at 128/64. She is on furosemide. Continue current meds at current dosing

## 2015-04-25 NOTE — Progress Notes (Signed)
04/25/2015 Perrin Maltese   [birth year]  [id number]  Primary Physician Purvis Kilts, MD Primary Cardiologist: Lorretta Harp MD Renae Gloss   HPI:  The patient is a 69 year old moderately overweight married Caucasian female with no children, whose husband is also a patient of mine and is accompanying her today. I saw her last 02/17/15.Marland Kitchen She has a history of critical aortic stenosis status post St. Jude AVR with a 21 mm St. Jude Regent mechanical valve by Dr. Arvid Right, August 01, 2010. She had normal coronary arteries and normal left ventricular function. She did have some perioperative PAF. She has sleep apnea, intolerant to CPAP. Her other problems include hypertension, hyperlipidemia intolerant to statin drugs, and non-insulin-requiring diabetes. She is otherwise asymptomatic. Early last year she fell down and hit her head and her subarachnoid hematoma and was off Coumadin for a month after that. She does desaturate with ambulation and has what sounds like diastolic dysfunction as well as mild to moderate renal insufficiency followed by Dr. [name] her serum creatinine runs in the 1.6 range.. She currently takes 120 mg of furosemide daily which improves her breathing.her last lipid profile performed by her nephrologist 11/01/14 revealed total cholesterol 200, LDL 116 and HDL of 48. She was hospitalized at [facility] for congestive heart failure after having had pizza and was diuresed. She does complain of dyspnea on exertion. Recent 2-D echo showed a well functioning aortic mechanical prosthesis, normal LV function with moderate to severe mitral stenosis with strep represents progression of disease.a transesophageal echocardiogram performed by Dr. Debara Pickett 02/23/15 confirmed moderate to severe mitral stenosis with normal LV systolic function and a well-functioning St. Jude aortic prosthesis. She saw both Drs. Bartle and Roxy Manns who thought that mitral valve intervention  was high risk. In order to definitively confirm severe mitral stenosis a transseptal puncture with direct measurement of left atrial and systemic blood pressures would be necessary. When asked about the degree of her symptoms she says that for the most part she is only mildly symptomatic with normal activity at this time.   Current Outpatient Prescriptions  Medication Sig Dispense Refill  . acetaminophen (TYLENOL) 500 MG tablet Take 250-500 mg by mouth every 6 (six) hours as needed.    Marland Kitchen amoxicillin-clavulanate (AUGMENTIN) 875-125 MG per tablet Take 1 tablet by mouth 2 (two) times daily. 28 tablet 0  . cyanocobalamin (,VITAMIN B-12,) 1000 MCG/ML injection Inject 1,000 mcg into the muscle every 30 (thirty) days.    Mariane Baumgarten Sodium (COLACE PO) Take 1 capsule by mouth at bedtime.     Marland Kitchen escitalopram (LEXAPRO) 20 MG tablet Take 10 mg by mouth daily.     Marland Kitchen FIBER SELECT GUMMIES CHEW Chew 1 tablet by mouth as needed.     . fluticasone (FLONASE) 50 MCG/ACT nasal spray Place 2 sprays into the nose daily as needed for allergies or rhinitis.     . furosemide (LASIX) 20 MG tablet Take by mouth. Takes 160 mg daily    . Insulin Lispro Prot & Lispro (HUMALOG 75/25 MIX) (75-25) 100 UNIT/ML Kwikpen Inject 35 Units into the skin 2 (two) times daily. 35 units in the am and 15 units at bedtime    . LORazepam (ATIVAN) 0.5 MG tablet Take 0.25 mg by mouth at bedtime.     . mupirocin cream (BACTROBAN) 2 % Apply 1 application topically daily as needed.    . warfarin (COUMADIN) 5 MG tablet TAKE 1 TO 1.5 TABLETS BY MOUTH EVERY  DAY 40 tablet 0   No current facility-administered medications for this visit.    Allergies  Allergen Reactions  . Cymbalta [Duloxetine Hcl] Shortness Of Breath and Other (See Comments)    Pt states she breaks out in blisters  . Demerol Other (See Comments)    Blood pressure goes up  . Meperidine Other (See Comments)    BP fluctuations  . Naprosyn [Naproxen] Swelling    Fluid  retention Legs swelling    History   Social History  . Marital Status: Married    Spouse Name: N/A  . Number of Children: N/A  . Years of Education: N/A   Occupational History  . Not on file.   Social History Main Topics  . Smoking status: Never Smoker   . Smokeless tobacco: Never Used  . Alcohol Use: No  . Drug Use: No  . Sexual Activity: Not Currently    Birth Control/ Protection: Post-menopausal   Other Topics Concern  . Not on file   Social History Narrative     Review of Systems: General: negative for chills, fever, night sweats or weight changes.  Cardiovascular: negative for chest pain, dyspnea on exertion, edema, orthopnea, palpitations, paroxysmal nocturnal dyspnea or shortness of breath Dermatological: negative for rash Respiratory: negative for cough or wheezing Urologic: negative for hematuria Abdominal: negative for nausea, vomiting, diarrhea, bright red blood per rectum, melena, or hematemesis Neurologic: negative for visual changes, syncope, or dizziness All other systems reviewed and are otherwise negative except as noted above.    Blood pressure 128/64, pulse 65, height 5' 3.5" (1.613 m), weight 253 lb 1 oz (114.788 kg).  General appearance: alert and no distress Neck: no adenopathy, no carotid bruit, no JVD, supple, symmetrical, trachea midline and thyroid not enlarged, symmetric, no tenderness/mass/nodules Lungs: clear to auscultation bilaterally Heart: crisp prosthetic valve sounds Extremities: 1+ pitting edema  EKG not performed today  ASSESSMENT AND PLAN:   S/P AVR (aortic valve replacement) History of severe aortic stenosis status post St. Jude AVR by Dr. Cyndia Bent 11/11. She is on Coumadin anticoagulation. She had a recent transesophageal echo demonstrated that her valve was working properly.  Mitral stenosis Recent transesophageal echo performed by Dr. Debara Pickett demonstrated moderate to severe mitral stenosis with a peak gradient of 21 mmHg  and a mean gradient of 15.  Hypertension History of hypertension with blood pressure measured at 128/64. She is on furosemide. Continue current meds at current dosing  Congestive heart failure The patient has preserved LV function with mitral stenosis and diastolic dysfunction on high-dose diabetics with creatinines running in the mid 1 range.      Lorretta Harp MD FACP,FACC,FAHA, [facility] 04/25/2015 11:47 AM

## 2015-04-25 NOTE — Patient Instructions (Addendum)
Your physician recommends that you schedule a follow-up appointment in 3 months with an extender.  Dr Gwenlyn Found recommends that you schedule a follow-up appointment in 6 months. You will receive a reminder letter in the mail two months in advance. If you don't receive a letter, please call our office to schedule the follow-up appointment.

## 2015-04-25 NOTE — Assessment & Plan Note (Signed)
History of severe aortic stenosis status post St. Jude AVR by Dr. Cyndia Bent 11/11. She is on Coumadin anticoagulation. She had a recent transesophageal echo demonstrated that her valve was working properly.

## 2015-04-25 NOTE — Assessment & Plan Note (Signed)
Recent transesophageal echo performed by Dr. Debara Pickett demonstrated moderate to severe mitral stenosis with a peak gradient of 21 mmHg and a mean gradient of 15.

## 2015-04-25 NOTE — Assessment & Plan Note (Signed)
The patient has preserved LV function with mitral stenosis and diastolic dysfunction on high-dose diabetics with creatinines running in the mid 1 range.

## 2015-05-01 ENCOUNTER — Encounter: Payer: Self-pay | Admitting: Adult Health

## 2015-05-01 ENCOUNTER — Other Ambulatory Visit: Payer: Self-pay | Admitting: Cardiovascular Disease

## 2015-05-01 ENCOUNTER — Ambulatory Visit (INDEPENDENT_AMBULATORY_CARE_PROVIDER_SITE_OTHER): Payer: Medicare Other | Admitting: Adult Health

## 2015-05-01 VITALS — BP 124/78 | HR 96 | Ht 63.25 in | Wt 255.0 lb

## 2015-05-01 DIAGNOSIS — N61 Inflammatory disorders of breast: Secondary | ICD-10-CM

## 2015-05-01 DIAGNOSIS — N611 Abscess of the breast and nipple: Secondary | ICD-10-CM

## 2015-05-01 NOTE — Progress Notes (Signed)
Subjective:     Patient ID: Hailey Curry, female   DOB: 06/17/1946, 69 y.o.   MRN: [id number]  HPI Hailey Curry is a 69 year old white female in for follow up of abscess left breast, and it is better.  Review of Systems Breast better  Patient denies any headaches, hearing loss, fatigue, blurred vision, chest pain, abdominal pain, problems with urination, or intercourse(not having sex). No joint pain or mood swings.Has Shortness of breath with exertion, has constipation at times.  Reviewed past medical,surgical, social and family history. Reviewed medications and allergies.     Objective:   Physical Exam BP 124/78 mmHg  Pulse 96  Ht 5' 3.25" (1.607 m)  Wt 255 lb (115.667 kg)  BMI 44.79 kg/m2  SpO2 94%  Skin warm and dry,  Breasts:no dominate palpable mass, retraction or nipple discharge, area at 3-5 0'clock resolving, non tender, she wanted to have lungs checked and they were clear to day, has murmur.She is using walker to day and has O2 on at 2 liters,    Assessment:     Abscess left breast resolved    Plan:     Follow up prn Get mammogram in the November

## 2015-05-01 NOTE — Patient Instructions (Signed)
Follow up prn

## 2015-05-02 DIAGNOSIS — I129 Hypertensive chronic kidney disease with stage 1 through stage 4 chronic kidney disease, or unspecified chronic kidney disease: Secondary | ICD-10-CM | POA: Diagnosis not present

## 2015-05-02 DIAGNOSIS — N183 Chronic kidney disease, stage 3 (moderate): Secondary | ICD-10-CM | POA: Diagnosis not present

## 2015-05-03 DIAGNOSIS — I509 Heart failure, unspecified: Secondary | ICD-10-CM | POA: Diagnosis not present

## 2015-05-05 ENCOUNTER — Encounter: Payer: Self-pay | Admitting: Cardiovascular Disease

## 2015-05-05 ENCOUNTER — Ambulatory Visit (INDEPENDENT_AMBULATORY_CARE_PROVIDER_SITE_OTHER): Payer: Medicare Other | Admitting: Cardiovascular Disease

## 2015-05-05 ENCOUNTER — Ambulatory Visit (INDEPENDENT_AMBULATORY_CARE_PROVIDER_SITE_OTHER): Payer: Medicare Other

## 2015-05-05 VITALS — BP 127/61 | HR 90 | Ht 63.5 in | Wt 253.4 lb

## 2015-05-05 DIAGNOSIS — I050 Rheumatic mitral stenosis: Secondary | ICD-10-CM | POA: Diagnosis not present

## 2015-05-05 DIAGNOSIS — Z79899 Other long term (current) drug therapy: Secondary | ICD-10-CM

## 2015-05-05 DIAGNOSIS — R002 Palpitations: Secondary | ICD-10-CM

## 2015-05-05 LAB — BASIC METABOLIC PANEL
BUN: 59 mg/dL — ABNORMAL HIGH (ref 7–25)
CO2: 31 mmol/L (ref 20–31)
Calcium: 9.5 mg/dL (ref 8.6–10.4)
Chloride: 102 mmol/L (ref 98–110)
Creat: 1.58 mg/dL — ABNORMAL HIGH (ref 0.60–0.93)
Glucose, Bld: 129 mg/dL — ABNORMAL HIGH (ref 65–99)
Potassium: 3.8 mmol/L (ref 3.5–5.3)
Sodium: 145 mmol/L (ref 135–146)

## 2015-05-05 NOTE — Progress Notes (Signed)
Hania returns today after last being seen week ago as an add-on because of palpitations which she felt this morning. She is regular on exam. We'll check a 12-lead EKG and get a two-week event monitor as well as blood work and I will see her back in 6-8 weeks.

## 2015-05-05 NOTE — Patient Instructions (Signed)
We will see you back in follow up in 4-6 weeks with Dr Gwenlyn Found.   Dr Gwenlyn Found has ordered: 1. Blood work to be done on your way out today.   2. Your Doctor has ordered you to wear a heart monitor. You will wear this for 14 days.   TIPS -  REMINDERS 1. The sensor is the lanyard that is worn around your neck every day - this is powered by a battery that needs to be changed every day 2. The monitor is the device that allows you to record symptoms - this will need to be charged daily 3. The sensor & monitor need to be within 100 feet of each other at all times 4. The sensor connects to the electrodes (stickers) - these should be changed every 24-48 hours (you do not have to remove them when you bathe, just make sure they are dry when you connect it back to the sensor 5. If you need more supplies (electrodes, batteries), please call the 1-800 # on the back of the pamphlet and [employer] will mail you more supplies 6. If your skin becomes sensitive, please try the sample pack of sensitive skin electrodes (the white packet in your silver box) and call [employer] to have them mail you more of these type of electrodes 7. When you are finish wearing the monitor, please place all supplies back in the silver box, place the silver box in the pre-packaged UPS bag and drop off at [employer] or call them so they can come pick it up   Cardiac Event Monitoring A cardiac event monitor is a small recording device used to help detect abnormal heart rhythms (arrhythmias). The monitor is used to record heart rhythm when noticeable symptoms such as the following occur:  Fast heartbeats (palpitations), such as heart racing or fluttering.  Dizziness.  Fainting or light-headedness.  Unexplained weakness. The monitor is wired to two electrodes placed on your chest. Electrodes are flat, sticky disks that attach to your skin. The monitor can be worn for up to 30 days. You will wear the monitor at all times, except when bathing.    HOW TO USE YOUR CARDIAC EVENT MONITOR A technician will prepare your chest for the electrode placement. The technician will show you how to place the electrodes, how to work the monitor, and how to replace the batteries. Take time to practice using the monitor before you leave the office. Make sure you understand how to send the information from the monitor to your health care provider. This requires a telephone with a landline, not a cell phone. You need to:  Wear your monitor at all times, except when you are in water:  Do not get the monitor wet.  Take the monitor off when bathing. Do not swim or use a hot tub with it on.  Keep your skin clean. Do not put body lotion or moisturizer on your chest.  Change the electrodes daily or any time they stop sticking to your skin. You might need to use tape to keep them on.  It is possible that your skin under the electrodes could become irritated. To keep this from happening, try to put the electrodes in slightly different places on your chest. However, they must remain in the area under your left breast and in the upper right section of your chest.  Make sure the monitor is safely clipped to your clothing or in a location close to your body that your health care provider recommends.  Press the button to record when you feel symptoms of heart trouble, such as dizziness, weakness, light-headedness, palpitations, thumping, shortness of breath, unexplained weakness, or a fluttering or racing heart. The monitor is always on and records what happened slightly before you pressed the button, so do not worry about being too late to get good information.  Keep a diary of your activities, such as walking, doing chores, and taking medicine. It is especially important to note what you were doing when you pushed the button to record your symptoms. This will help your health care provider determine what might be contributing to your symptoms. The information stored  in your monitor will be reviewed by your health care provider alongside your diary entries.  Send the recorded information as recommended by your health care provider. It is important to understand that it will take some time for your health care provider to process the results.  Change the batteries as recommended by your health care provider. SEEK IMMEDIATE MEDICAL CARE IF:   You have chest pain.  You have extreme difficulty breathing or shortness of breath.  You develop a very fast heartbeat that persists.  You develop dizziness that does not go away.  You faint or constantly feel you are about to faint. Document Released: 06/25/2008 Document Revised: 01/31/2014 Document Reviewed: 03/15/2013 [facility] Patient Information 2015 [place], [place]. This information is not intended to replace advice given to you by your health care provider. Make sure you discuss any questions you have with your health care provider.

## 2015-05-05 NOTE — Assessment & Plan Note (Signed)
Hailey Curry returns after being seen one week ago because of palpitations which she felt this morning. Her heart rate is regular exam today. We will check a 12-lead EKG in order to week event monitor. We'll check a basic metabolic panel today to assess for electrolyte imbalance.

## 2015-05-16 ENCOUNTER — Other Ambulatory Visit: Payer: Self-pay | Admitting: Adult Health

## 2015-05-16 ENCOUNTER — Ambulatory Visit: Payer: Medicare Other | Admitting: Urology

## 2015-05-22 ENCOUNTER — Ambulatory Visit (INDEPENDENT_AMBULATORY_CARE_PROVIDER_SITE_OTHER): Payer: Medicare Other | Admitting: *Deleted

## 2015-05-22 DIAGNOSIS — Z952 Presence of prosthetic heart valve: Secondary | ICD-10-CM

## 2015-05-22 DIAGNOSIS — Z7901 Long term (current) use of anticoagulants: Secondary | ICD-10-CM

## 2015-05-22 DIAGNOSIS — Z954 Presence of other heart-valve replacement: Secondary | ICD-10-CM

## 2015-05-22 LAB — POCT INR: INR: 2.3

## 2015-05-29 ENCOUNTER — Telehealth: Payer: Self-pay | Admitting: Cardiovascular Disease

## 2015-05-29 NOTE — Telephone Encounter (Signed)
Hailey Curry is calling because she is on oxygen and when she takes the oxygen her pulse is going up and down. Please call   Thanks

## 2015-05-29 NOTE — Telephone Encounter (Signed)
Spoke with patient who reports that around 1030 last nite her HR was ranging 59-150bpm - she was asymptomatic. She reports her husbands HR was bouncing all over the place too. She states since around 1pm today her HR is normal in the 90s and her husband's is normal now too. She states they both ate the same thing yesterday - chicken sandwich and half a frosty from [facility] and finished the frosty around 1030 which is when this started. She reports last week she was under a lot of stress and wasn't eating or drinking much and thought she was dehydrated.   Advised patient I would let Dr. Gwenlyn Found know and asked that if this should occur again, that she check her BP and HR against her pulse ox reading with her BP cuff for correlation. Explained that factors such as temperature of extremities, movement, etc can affect the pulse ox readings.   Patient voiced understanding

## 2015-05-30 NOTE — Telephone Encounter (Signed)
I agree with above 

## 2015-06-03 DIAGNOSIS — I509 Heart failure, unspecified: Secondary | ICD-10-CM | POA: Diagnosis not present

## 2015-06-07 ENCOUNTER — Telehealth: Payer: Self-pay | Admitting: *Deleted

## 2015-06-07 MED ORDER — METOPROLOL SUCCINATE ER 25 MG PO TB24: 25.0000 mg | ORAL_TABLET | Freq: Every day | ORAL | Status: DC

## 2015-06-07 NOTE — Telephone Encounter (Signed)
-----   Message from Lorretta Harp, MD sent at 06/05/2015  2:56 PM EDT ----- SR/ST with PACs/PVCs and short runs of PSVT

## 2015-06-08 ENCOUNTER — Telehealth: Payer: Self-pay | Admitting: Cardiovascular Disease

## 2015-06-08 DIAGNOSIS — E1165 Type 2 diabetes mellitus with hyperglycemia: Secondary | ICD-10-CM | POA: Diagnosis not present

## 2015-06-08 DIAGNOSIS — I10 Essential (primary) hypertension: Secondary | ICD-10-CM | POA: Diagnosis not present

## 2015-06-08 NOTE — Telephone Encounter (Signed)
Pt's husband called in stating that Dr. Gwenlyn Found prescribed Metoprolol 25mg  for her yesterday. He states that she took a 1/2 a pill yesterday and wanted to know if she could take the same dosage for about a week and then work back up to the whole tablet. Please call  Thanks

## 2015-06-08 NOTE — Telephone Encounter (Signed)
Lm that that was ok to take 1/2 tab then increase to 1 tablet.

## 2015-06-12 ENCOUNTER — Telehealth: Payer: Self-pay | Admitting: Cardiovascular Disease

## 2015-06-12 NOTE — Telephone Encounter (Signed)
Pt's husband called in wanting to speak with Hailey Curry about her Metoprolol medication. He wanted to know if she could start off by taking a half a pill a day and then gradually start taking a whole pill. Please f/u with pt  Thanks

## 2015-06-12 NOTE — Telephone Encounter (Signed)
Pt's husband called in wanting to speak with Curt Bears about her Metoprolol . He wanted to know if it was all right for her to take a half a pill for a little while and then gradually start taking a whole pill. Please call  Thanks

## 2015-06-12 NOTE — Telephone Encounter (Signed)
Patients husband called and said the patient was going to start taking a whole metoprolol pill (25 mg) starting tomorrow.   Her pulse has been running from 70 to 120  Has been taking a half of pill since 9/8  Wife will start taking a whole pill tomorrow and will call if heart rate is <50   Has an OV with Dr. Gwenlyn Found on 9/23

## 2015-06-15 DIAGNOSIS — N189 Chronic kidney disease, unspecified: Secondary | ICD-10-CM | POA: Diagnosis not present

## 2015-06-15 DIAGNOSIS — I4510 Unspecified right bundle-branch block: Secondary | ICD-10-CM | POA: Diagnosis not present

## 2015-06-15 DIAGNOSIS — E119 Type 2 diabetes mellitus without complications: Secondary | ICD-10-CM | POA: Diagnosis not present

## 2015-06-15 DIAGNOSIS — I342 Nonrheumatic mitral (valve) stenosis: Secondary | ICD-10-CM | POA: Diagnosis not present

## 2015-06-15 DIAGNOSIS — I4581 Long QT syndrome: Secondary | ICD-10-CM | POA: Diagnosis not present

## 2015-06-15 DIAGNOSIS — I129 Hypertensive chronic kidney disease with stage 1 through stage 4 chronic kidney disease, or unspecified chronic kidney disease: Secondary | ICD-10-CM | POA: Diagnosis not present

## 2015-06-15 DIAGNOSIS — E785 Hyperlipidemia, unspecified: Secondary | ICD-10-CM | POA: Diagnosis not present

## 2015-06-15 DIAGNOSIS — I050 Rheumatic mitral stenosis: Secondary | ICD-10-CM | POA: Diagnosis not present

## 2015-06-15 DIAGNOSIS — R0602 Shortness of breath: Secondary | ICD-10-CM | POA: Diagnosis not present

## 2015-06-15 DIAGNOSIS — I4460 Unspecified fascicular block: Secondary | ICD-10-CM | POA: Diagnosis not present

## 2015-06-15 NOTE — Telephone Encounter (Signed)
Can this encounter be closed?

## 2015-06-16 ENCOUNTER — Telehealth: Payer: Self-pay | Admitting: Cardiovascular Disease

## 2015-06-16 NOTE — Telephone Encounter (Signed)
Received records from [facility] for appointment with Dr Gwenlyn Found on 06/23/15.  Records given to [facility] (medical records) for Dr Kennon Holter schedule on 06/23/15. lp

## 2015-06-20 ENCOUNTER — Ambulatory Visit: Payer: Medicare Other | Admitting: Cardiovascular Disease

## 2015-06-23 ENCOUNTER — Ambulatory Visit (INDEPENDENT_AMBULATORY_CARE_PROVIDER_SITE_OTHER): Payer: Medicare Other | Admitting: Cardiovascular Disease

## 2015-06-23 ENCOUNTER — Ambulatory Visit (INDEPENDENT_AMBULATORY_CARE_PROVIDER_SITE_OTHER): Payer: Medicare Other | Admitting: Pharmacist Clinician (PhC)/ Clinical Pharmacy Specialist

## 2015-06-23 ENCOUNTER — Encounter: Payer: Self-pay | Admitting: Cardiovascular Disease

## 2015-06-23 VITALS — BP 118/58 | HR 87 | Ht 63.5 in | Wt 247.8 lb

## 2015-06-23 DIAGNOSIS — Z952 Presence of prosthetic heart valve: Secondary | ICD-10-CM

## 2015-06-23 DIAGNOSIS — Z7901 Long term (current) use of anticoagulants: Secondary | ICD-10-CM

## 2015-06-23 DIAGNOSIS — Z954 Presence of other heart-valve replacement: Secondary | ICD-10-CM | POA: Diagnosis not present

## 2015-06-23 DIAGNOSIS — I050 Rheumatic mitral stenosis: Secondary | ICD-10-CM | POA: Diagnosis not present

## 2015-06-23 LAB — POCT INR: INR: 2.7

## 2015-06-23 NOTE — Patient Instructions (Signed)
Medication Instructions:  Your physician recommends that you continue on your current medications as directed. Please refer to the Current Medication list given to you today.   Labwork: NONE  Testing/Procedures: NONE  Follow-Up: Your physician recommends that you schedule a follow-up appointment in: 3 months with Dr. Gwenlyn Found.   Any Other Special Instructions Will Be Listed Below (If Applicable).

## 2015-06-23 NOTE — Progress Notes (Signed)
Hailey Curry saw Dr. Mina Marble at [facility] for second opinion. I'm sending him copies of her TTE &  TEE for personal review. Plans are to see her back after that to make further recommendations regarding her mitral stenosis and possible therapeutic options.

## 2015-06-23 NOTE — Assessment & Plan Note (Signed)
Hailey Curry saw Dr. Mina Marble at [facility] for second opinion. I'm sending him copies of her TTE &  TEE for personal review. Plans are to see her back after that to make further recommendations regarding her mitral stenosis and possible therapeutic options.

## 2015-07-03 ENCOUNTER — Other Ambulatory Visit (HOSPITAL_COMMUNITY): Payer: Self-pay | Admitting: Cardiology

## 2015-07-03 ENCOUNTER — Telehealth: Payer: Self-pay | Admitting: Cardiovascular Disease

## 2015-07-03 DIAGNOSIS — I509 Heart failure, unspecified: Secondary | ICD-10-CM | POA: Diagnosis not present

## 2015-07-03 DIAGNOSIS — Z1231 Encounter for screening mammogram for malignant neoplasm of breast: Secondary | ICD-10-CM

## 2015-07-05 DIAGNOSIS — I129 Hypertensive chronic kidney disease with stage 1 through stage 4 chronic kidney disease, or unspecified chronic kidney disease: Secondary | ICD-10-CM | POA: Diagnosis not present

## 2015-07-05 NOTE — Telephone Encounter (Signed)
Closed encounter °

## 2015-07-05 NOTE — Telephone Encounter (Signed)
10/4: Collected disc from endoscopy and echo department and sent to Dr. Cipriano Mile office, overnight for delivery by 5pm.  Tracking: [id number]

## 2015-07-10 DIAGNOSIS — I050 Rheumatic mitral stenosis: Secondary | ICD-10-CM | POA: Diagnosis not present

## 2015-07-17 DIAGNOSIS — B961 Klebsiella pneumoniae [K. pneumoniae] as the cause of diseases classified elsewhere: Secondary | ICD-10-CM | POA: Diagnosis not present

## 2015-07-17 DIAGNOSIS — R042 Hemoptysis: Secondary | ICD-10-CM | POA: Diagnosis not present

## 2015-07-17 DIAGNOSIS — C801 Malignant (primary) neoplasm, unspecified: Secondary | ICD-10-CM | POA: Diagnosis not present

## 2015-07-17 DIAGNOSIS — Z66 Do not resuscitate: Secondary | ICD-10-CM | POA: Diagnosis not present

## 2015-07-17 DIAGNOSIS — I342 Nonrheumatic mitral (valve) stenosis: Secondary | ICD-10-CM | POA: Diagnosis not present

## 2015-07-17 DIAGNOSIS — I272 Other secondary pulmonary hypertension: Secondary | ICD-10-CM | POA: Diagnosis not present

## 2015-07-17 DIAGNOSIS — R6521 Severe sepsis with septic shock: Secondary | ICD-10-CM | POA: Diagnosis not present

## 2015-07-17 DIAGNOSIS — J690 Pneumonitis due to inhalation of food and vomit: Secondary | ICD-10-CM | POA: Diagnosis not present

## 2015-07-17 DIAGNOSIS — E883 Tumor lysis syndrome: Secondary | ICD-10-CM | POA: Diagnosis not present

## 2015-07-17 DIAGNOSIS — Z952 Presence of prosthetic heart valve: Secondary | ICD-10-CM | POA: Diagnosis not present

## 2015-07-17 DIAGNOSIS — C8310 Mantle cell lymphoma, unspecified site: Secondary | ICD-10-CM | POA: Diagnosis not present

## 2015-07-17 DIAGNOSIS — J9622 Acute and chronic respiratory failure with hypercapnia: Secondary | ICD-10-CM | POA: Diagnosis not present

## 2015-07-17 DIAGNOSIS — A419 Sepsis, unspecified organism: Secondary | ICD-10-CM | POA: Diagnosis not present

## 2015-07-17 DIAGNOSIS — E1165 Type 2 diabetes mellitus with hyperglycemia: Secondary | ICD-10-CM | POA: Diagnosis not present

## 2015-07-17 DIAGNOSIS — D72820 Lymphocytosis (symptomatic): Secondary | ICD-10-CM | POA: Diagnosis not present

## 2015-07-17 DIAGNOSIS — R590 Localized enlarged lymph nodes: Secondary | ICD-10-CM | POA: Diagnosis not present

## 2015-07-17 DIAGNOSIS — I9589 Other hypotension: Secondary | ICD-10-CM | POA: Diagnosis not present

## 2015-07-17 DIAGNOSIS — N390 Urinary tract infection, site not specified: Secondary | ICD-10-CM | POA: Diagnosis not present

## 2015-07-17 DIAGNOSIS — F329 Major depressive disorder, single episode, unspecified: Secondary | ICD-10-CM | POA: Diagnosis not present

## 2015-07-17 DIAGNOSIS — Z515 Encounter for palliative care: Secondary | ICD-10-CM | POA: Diagnosis not present

## 2015-07-17 DIAGNOSIS — F419 Anxiety disorder, unspecified: Secondary | ICD-10-CM | POA: Diagnosis not present

## 2015-07-17 DIAGNOSIS — R571 Hypovolemic shock: Secondary | ICD-10-CM | POA: Diagnosis not present

## 2015-07-17 DIAGNOSIS — R109 Unspecified abdominal pain: Secondary | ICD-10-CM | POA: Diagnosis not present

## 2015-07-17 DIAGNOSIS — C649 Malignant neoplasm of unspecified kidney, except renal pelvis: Secondary | ICD-10-CM | POA: Diagnosis not present

## 2015-07-17 DIAGNOSIS — E118 Type 2 diabetes mellitus with unspecified complications: Secondary | ICD-10-CM | POA: Diagnosis not present

## 2015-07-17 DIAGNOSIS — M4804 Spinal stenosis, thoracic region: Secondary | ICD-10-CM | POA: Diagnosis not present

## 2015-07-17 DIAGNOSIS — I97621 Postprocedural hematoma of a circulatory system organ or structure following other procedure: Secondary | ICD-10-CM | POA: Diagnosis not present

## 2015-07-17 DIAGNOSIS — N179 Acute kidney failure, unspecified: Secondary | ICD-10-CM | POA: Diagnosis not present

## 2015-07-17 DIAGNOSIS — D693 Immune thrombocytopenic purpura: Secondary | ICD-10-CM | POA: Diagnosis not present

## 2015-07-17 DIAGNOSIS — I4891 Unspecified atrial fibrillation: Secondary | ICD-10-CM | POA: Diagnosis not present

## 2015-07-17 DIAGNOSIS — N189 Chronic kidney disease, unspecified: Secondary | ICD-10-CM | POA: Diagnosis not present

## 2015-07-17 DIAGNOSIS — I724 Aneurysm of artery of lower extremity: Secondary | ICD-10-CM | POA: Diagnosis not present

## 2015-07-17 DIAGNOSIS — J811 Chronic pulmonary edema: Secondary | ICD-10-CM | POA: Diagnosis not present

## 2015-07-17 DIAGNOSIS — E872 Acidosis: Secondary | ICD-10-CM | POA: Diagnosis not present

## 2015-07-17 DIAGNOSIS — I517 Cardiomegaly: Secondary | ICD-10-CM | POA: Diagnosis not present

## 2015-07-17 DIAGNOSIS — C642 Malignant neoplasm of left kidney, except renal pelvis: Secondary | ICD-10-CM | POA: Diagnosis not present

## 2015-07-17 DIAGNOSIS — R570 Cardiogenic shock: Secondary | ICD-10-CM | POA: Diagnosis not present

## 2015-07-17 DIAGNOSIS — Z4682 Encounter for fitting and adjustment of non-vascular catheter: Secondary | ICD-10-CM | POA: Diagnosis not present

## 2015-07-17 DIAGNOSIS — I5033 Acute on chronic diastolic (congestive) heart failure: Secondary | ICD-10-CM | POA: Diagnosis not present

## 2015-07-17 DIAGNOSIS — J9621 Acute and chronic respiratory failure with hypoxia: Secondary | ICD-10-CM | POA: Diagnosis not present

## 2015-07-17 DIAGNOSIS — I4892 Unspecified atrial flutter: Secondary | ICD-10-CM | POA: Diagnosis not present

## 2015-07-17 DIAGNOSIS — I97638 Postprocedural hematoma of a circulatory system organ or structure following other circulatory system procedure: Secondary | ICD-10-CM | POA: Diagnosis not present

## 2015-07-17 DIAGNOSIS — I38 Endocarditis, valve unspecified: Secondary | ICD-10-CM | POA: Diagnosis not present

## 2015-07-17 DIAGNOSIS — R509 Fever, unspecified: Secondary | ICD-10-CM | POA: Diagnosis not present

## 2015-07-17 DIAGNOSIS — E870 Hyperosmolality and hypernatremia: Secondary | ICD-10-CM | POA: Diagnosis not present

## 2015-07-17 DIAGNOSIS — N2889 Other specified disorders of kidney and ureter: Secondary | ICD-10-CM | POA: Diagnosis not present

## 2015-07-17 DIAGNOSIS — R578 Other shock: Secondary | ICD-10-CM | POA: Diagnosis not present

## 2015-07-17 DIAGNOSIS — R918 Other nonspecific abnormal finding of lung field: Secondary | ICD-10-CM | POA: Diagnosis not present

## 2015-07-17 DIAGNOSIS — L7632 Postprocedural hematoma of skin and subcutaneous tissue following other procedure: Secondary | ICD-10-CM | POA: Diagnosis not present

## 2015-07-17 DIAGNOSIS — G8929 Other chronic pain: Secondary | ICD-10-CM | POA: Diagnosis not present

## 2015-07-17 DIAGNOSIS — I480 Paroxysmal atrial fibrillation: Secondary | ICD-10-CM | POA: Diagnosis not present

## 2015-07-17 DIAGNOSIS — Z452 Encounter for adjustment and management of vascular access device: Secondary | ICD-10-CM | POA: Diagnosis not present

## 2015-07-17 DIAGNOSIS — Z794 Long term (current) use of insulin: Secondary | ICD-10-CM | POA: Diagnosis not present

## 2015-07-17 DIAGNOSIS — K219 Gastro-esophageal reflux disease without esophagitis: Secondary | ICD-10-CM | POA: Diagnosis not present

## 2015-07-17 DIAGNOSIS — Z789 Other specified health status: Secondary | ICD-10-CM | POA: Diagnosis not present

## 2015-07-17 DIAGNOSIS — I509 Heart failure, unspecified: Secondary | ICD-10-CM | POA: Diagnosis not present

## 2015-07-17 DIAGNOSIS — C8318 Mantle cell lymphoma, lymph nodes of multiple sites: Secondary | ICD-10-CM | POA: Diagnosis not present

## 2015-08-07 ENCOUNTER — Ambulatory Visit (HOSPITAL_COMMUNITY): Payer: Medicare Other

## 2015-08-07 ENCOUNTER — Ambulatory Visit: Payer: Self-pay | Admitting: *Deleted

## 2015-08-07 DIAGNOSIS — Z952 Presence of prosthetic heart valve: Secondary | ICD-10-CM

## 2015-08-07 DIAGNOSIS — Z7901 Long term (current) use of anticoagulants: Secondary | ICD-10-CM

## 2015-08-31 DEATH — deceased

## 2015-09-14 ENCOUNTER — Telehealth: Payer: Self-pay | Admitting: Cardiovascular Disease

## 2015-09-14 NOTE — Telephone Encounter (Signed)
Passed away on 08/12/15 . Per Husband

## 2015-09-26 ENCOUNTER — Ambulatory Visit: Payer: Medicare Other | Admitting: Cardiovascular Disease
